# Patient Record
Sex: Female | Born: 1940
Health system: Southern US, Community
[De-identification: ages and names within clinical notes are randomized; demographics above are authoritative.]

## PROBLEM LIST (undated history)

## (undated) DIAGNOSIS — I252 Old myocardial infarction: Secondary | ICD-10-CM

## (undated) DIAGNOSIS — D4819 Other specified neoplasm of uncertain behavior of connective and other soft tissue: Secondary | ICD-10-CM

## (undated) DIAGNOSIS — K5730 Diverticulosis of large intestine without perforation or abscess without bleeding: Secondary | ICD-10-CM

## (undated) DIAGNOSIS — Z5189 Encounter for other specified aftercare: Secondary | ICD-10-CM

## (undated) DIAGNOSIS — D649 Anemia, unspecified: Secondary | ICD-10-CM

## (undated) DIAGNOSIS — M549 Dorsalgia, unspecified: Secondary | ICD-10-CM

## (undated) DIAGNOSIS — D481 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nd other soft tissue: Secondary | ICD-10-CM

## (undated) DIAGNOSIS — R350 Frequency of micturition: Secondary | ICD-10-CM

## (undated) DIAGNOSIS — I739 Peripheral vascular disease, unspecified: Secondary | ICD-10-CM

## (undated) DIAGNOSIS — I779 Disorder of arteries and arterioles, unspecified: Secondary | ICD-10-CM

## (undated) DIAGNOSIS — K922 Gastrointestinal hemorrhage, unspecified: Secondary | ICD-10-CM

## (undated) DIAGNOSIS — I5181 Takotsubo syndrome: Secondary | ICD-10-CM

## (undated) DIAGNOSIS — G8929 Other chronic pain: Secondary | ICD-10-CM

## (undated) DIAGNOSIS — Z8701 Personal history of pneumonia (recurrent): Secondary | ICD-10-CM

## (undated) DIAGNOSIS — I2510 Atherosclerotic heart disease of native coronary artery without angina pectoris: Secondary | ICD-10-CM

## (undated) DIAGNOSIS — Z9289 Personal history of other medical treatment: Secondary | ICD-10-CM

## (undated) DIAGNOSIS — Z860100 Personal history of colon polyps, unspecified: Secondary | ICD-10-CM

## (undated) DIAGNOSIS — E039 Hypothyroidism, unspecified: Secondary | ICD-10-CM

## (undated) DIAGNOSIS — E785 Hyperlipidemia, unspecified: Secondary | ICD-10-CM

## (undated) DIAGNOSIS — J40 Bronchitis, not specified as acute or chronic: Secondary | ICD-10-CM

## (undated) DIAGNOSIS — I10 Essential (primary) hypertension: Secondary | ICD-10-CM

## (undated) DIAGNOSIS — K5792 Diverticulitis of intestine, part unspecified, without perforation or abscess without bleeding: Secondary | ICD-10-CM

## (undated) DIAGNOSIS — M1990 Unspecified osteoarthritis, unspecified site: Secondary | ICD-10-CM

## (undated) DIAGNOSIS — I7300 Raynaud's syndrome without gangrene: Secondary | ICD-10-CM

## (undated) DIAGNOSIS — IMO0002 Reserved for concepts with insufficient information to code with codable children: Secondary | ICD-10-CM

## (undated) HISTORY — DX: Old myocardial infarction: I25.2

## (undated) HISTORY — PX: OTHER SURGICAL HISTORY: SHX169

## (undated) HISTORY — DX: Peripheral vascular disease, unspecified: I73.9

## (undated) HISTORY — DX: Disorder of arteries and arterioles, unspecified: I77.9

## (undated) HISTORY — DX: Hypothyroidism, unspecified: E03.9

## (undated) HISTORY — DX: Encounter for other specified aftercare: Z51.89

## (undated) HISTORY — DX: Personal history of colon polyps, unspecified: Z86.0100

## (undated) HISTORY — DX: Personal history of colonic polyps: Z86.010

## (undated) HISTORY — DX: Diverticulosis of large intestine without perforation or abscess without bleeding: K57.30

## (undated) HISTORY — PX: CHOLECYSTECTOMY: SHX55

## (undated) HISTORY — DX: Other specified neoplasm of uncertain behavior of connective and other soft tissue: D48.19

## (undated) HISTORY — PX: CORONARY ANGIOPLASTY WITH STENT PLACEMENT: SHX49

## (undated) HISTORY — PX: TUBAL LIGATION: SHX77

## (undated) HISTORY — DX: Gastrointestinal hemorrhage, unspecified: K92.2

## (undated) HISTORY — DX: 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nd other soft tissue: D48.1

## (undated) HISTORY — DX: Takotsubo syndrome: I51.81

## (undated) HISTORY — DX: Reserved for concepts with insufficient information to code with codable children: IMO0002

## (undated) HISTORY — DX: Frequency of micturition: R35.0

## (undated) HISTORY — DX: Raynaud's syndrome without gangrene: I73.00

## (undated) HISTORY — DX: Unspecified osteoarthritis, unspecified site: M19.90

## (undated) HISTORY — PX: ABDOMINAL HYSTERECTOMY: SUR658

## (undated) HISTORY — DX: Personal history of other medical treatment: Z92.89

---
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
## 1967-12-04 HISTORY — PX: BREAST EXCISIONAL BIOPSY: SUR124

## 1973-12-03 HISTORY — PX: ABDOMINAL HYSTERECTOMY: SHX81

## 1996-02-15 DIAGNOSIS — K5730 Diverticulosis of large intestine without perforation or abscess without bleeding: Secondary | ICD-10-CM | POA: Insufficient documentation

## 1999-01-24 ENCOUNTER — Encounter: Payer: Self-pay | Admitting: Emergency Medicine

## 1999-01-24 ENCOUNTER — Emergency Department (HOSPITAL_COMMUNITY): Admission: EM | Admit: 1999-01-24 | Discharge: 1999-01-24 | Payer: Self-pay | Admitting: Emergency Medicine

## 1999-06-09 ENCOUNTER — Encounter: Payer: Self-pay | Admitting: Internal Medicine

## 1999-07-17 ENCOUNTER — Encounter: Payer: Self-pay | Admitting: Vascular Surgery

## 1999-07-19 ENCOUNTER — Encounter (INDEPENDENT_AMBULATORY_CARE_PROVIDER_SITE_OTHER): Payer: Self-pay | Admitting: Specialist

## 1999-07-19 ENCOUNTER — Ambulatory Visit (HOSPITAL_COMMUNITY): Admission: RE | Admit: 1999-07-19 | Discharge: 1999-07-19 | Payer: Self-pay | Admitting: Vascular Surgery

## 2001-04-23 ENCOUNTER — Encounter: Payer: Self-pay | Admitting: Internal Medicine

## 2003-05-12 ENCOUNTER — Encounter: Payer: Self-pay | Admitting: Internal Medicine

## 2006-06-07 ENCOUNTER — Encounter: Payer: Self-pay | Admitting: Internal Medicine

## 2006-12-03 DIAGNOSIS — I252 Old myocardial infarction: Secondary | ICD-10-CM

## 2006-12-03 HISTORY — DX: Old myocardial infarction: I25.2

## 2007-01-07 ENCOUNTER — Ambulatory Visit: Payer: Self-pay | Admitting: Internal Medicine

## 2007-01-07 LAB — CONVERTED CEMR LAB
Basophils Absolute: 0 10*3/uL (ref 0.0–0.1)
Chloride: 106 meq/L (ref 96–112)
Creatinine, Ser: 1 mg/dL (ref 0.4–1.2)
Direct LDL: 214.9 mg/dL
Eosinophils Relative: 0.9 % (ref 0.0–5.0)
Glucose, Bld: 95 mg/dL (ref 70–99)
HCT: 41.9 % (ref 36.0–46.0)
Neutrophils Relative %: 54.2 % (ref 43.0–77.0)
RBC: 4.31 M/uL (ref 3.87–5.11)
RDW: 13.6 % (ref 11.5–14.6)
Sodium: 144 meq/L (ref 135–145)
Total CHOL/HDL Ratio: 7
Triglycerides: 217 mg/dL (ref 0–149)
WBC: 7.5 10*3/uL (ref 4.5–10.5)

## 2007-03-07 ENCOUNTER — Encounter: Payer: Self-pay | Admitting: Emergency Medicine

## 2007-03-07 ENCOUNTER — Ambulatory Visit: Payer: Self-pay | Admitting: *Deleted

## 2007-03-08 ENCOUNTER — Ambulatory Visit: Payer: Self-pay | Admitting: Internal Medicine

## 2007-03-08 ENCOUNTER — Inpatient Hospital Stay (HOSPITAL_COMMUNITY): Admission: EM | Admit: 2007-03-08 | Discharge: 2007-03-14 | Payer: Self-pay | Admitting: *Deleted

## 2007-03-08 ENCOUNTER — Encounter: Payer: Self-pay | Admitting: Internal Medicine

## 2007-03-11 ENCOUNTER — Encounter: Payer: Self-pay | Admitting: Gastroenterology

## 2007-03-11 ENCOUNTER — Encounter (INDEPENDENT_AMBULATORY_CARE_PROVIDER_SITE_OTHER): Payer: Self-pay | Admitting: Specialist

## 2007-03-11 DIAGNOSIS — K922 Gastrointestinal hemorrhage, unspecified: Secondary | ICD-10-CM | POA: Insufficient documentation

## 2007-03-13 ENCOUNTER — Ambulatory Visit: Payer: Self-pay | Admitting: Gastroenterology

## 2007-04-07 ENCOUNTER — Ambulatory Visit: Payer: Self-pay | Admitting: Gastroenterology

## 2007-04-07 LAB — CONVERTED CEMR LAB
AST: 22 units/L (ref 0–37)
Albumin: 3.9 g/dL (ref 3.5–5.2)
Basophils Absolute: 0 10*3/uL (ref 0.0–0.1)
Bilirubin, Direct: 0.1 mg/dL (ref 0.0–0.3)
CO2: 32 meq/L (ref 19–32)
Creatinine, Ser: 0.9 mg/dL (ref 0.4–1.2)
Eosinophils Relative: 1.2 % (ref 0.0–5.0)
Glucose, Bld: 102 mg/dL — ABNORMAL HIGH (ref 70–99)
HCT: 38.2 % (ref 36.0–46.0)
Hemoglobin: 13.1 g/dL (ref 12.0–15.0)
MCHC: 34.4 g/dL (ref 30.0–36.0)
Monocytes Absolute: 0.6 10*3/uL (ref 0.2–0.7)
Neutrophils Relative %: 57 % (ref 43.0–77.0)
Potassium: 3.5 meq/L (ref 3.5–5.1)
RDW: 13.4 % (ref 11.5–14.6)
Sodium: 139 meq/L (ref 135–145)
Total Bilirubin: 0.7 mg/dL (ref 0.3–1.2)
Total Protein: 7.1 g/dL (ref 6.0–8.3)
WBC: 6.4 10*3/uL (ref 4.5–10.5)

## 2007-04-10 ENCOUNTER — Ambulatory Visit: Payer: Self-pay | Admitting: Cardiovascular Disease

## 2007-04-17 ENCOUNTER — Encounter: Payer: Self-pay | Admitting: Gastroenterology

## 2007-04-17 ENCOUNTER — Encounter: Payer: Self-pay | Admitting: Internal Medicine

## 2007-04-17 ENCOUNTER — Ambulatory Visit (HOSPITAL_COMMUNITY): Admission: RE | Admit: 2007-04-17 | Discharge: 2007-04-17 | Payer: Self-pay | Admitting: Gastroenterology

## 2007-04-17 ENCOUNTER — Encounter (INDEPENDENT_AMBULATORY_CARE_PROVIDER_SITE_OTHER): Payer: Self-pay | Admitting: *Deleted

## 2007-04-30 ENCOUNTER — Ambulatory Visit: Payer: Self-pay | Admitting: Gastroenterology

## 2007-07-31 ENCOUNTER — Ambulatory Visit: Payer: Self-pay | Admitting: Cardiovascular Disease

## 2007-09-08 ENCOUNTER — Telehealth: Payer: Self-pay | Admitting: Internal Medicine

## 2007-09-09 ENCOUNTER — Ambulatory Visit: Payer: Self-pay | Admitting: Gastroenterology

## 2007-09-09 LAB — CONVERTED CEMR LAB
ALT: 9 units/L (ref 0–35)
AST: 14 units/L (ref 0–37)
Albumin: 4 g/dL (ref 3.5–5.2)
Amylase: 101 units/L (ref 27–131)
Basophils Relative: 0.2 % (ref 0.0–1.0)
Bilirubin, Direct: 0.1 mg/dL (ref 0.0–0.3)
Calcium: 9.6 mg/dL (ref 8.4–10.5)
Chloride: 97 meq/L (ref 96–112)
Creatinine, Ser: 1 mg/dL (ref 0.4–1.2)
Eosinophils Relative: 0.7 % (ref 0.0–5.0)
GFR calc non Af Amer: 59 mL/min
Glucose, Bld: 97 mg/dL (ref 70–99)
HCT: 38.3 % (ref 36.0–46.0)
Neutrophils Relative %: 51 % (ref 43.0–77.0)
Platelets: 322 10*3/uL (ref 150–400)
RBC: 4.01 M/uL (ref 3.87–5.11)
RDW: 13.4 % (ref 11.5–14.6)
Sodium: 138 meq/L (ref 135–145)
Total Bilirubin: 0.7 mg/dL (ref 0.3–1.2)
WBC: 7.6 10*3/uL (ref 4.5–10.5)

## 2007-09-11 ENCOUNTER — Ambulatory Visit: Payer: Self-pay | Admitting: Cardiology

## 2007-09-30 ENCOUNTER — Ambulatory Visit: Payer: Self-pay | Admitting: Gastroenterology

## 2007-10-14 ENCOUNTER — Encounter: Payer: Self-pay | Admitting: Internal Medicine

## 2007-10-15 ENCOUNTER — Ambulatory Visit: Payer: Self-pay | Admitting: Cardiology

## 2007-10-16 ENCOUNTER — Ambulatory Visit: Payer: Self-pay

## 2007-10-16 ENCOUNTER — Encounter: Payer: Self-pay | Admitting: Internal Medicine

## 2007-10-20 DIAGNOSIS — D481 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nd other soft tissue: Secondary | ICD-10-CM

## 2007-10-29 ENCOUNTER — Encounter: Payer: Self-pay | Admitting: Internal Medicine

## 2007-11-03 ENCOUNTER — Ambulatory Visit: Payer: Self-pay | Admitting: Internal Medicine

## 2007-11-10 ENCOUNTER — Ambulatory Visit: Payer: Self-pay | Admitting: Internal Medicine

## 2007-11-10 DIAGNOSIS — R350 Frequency of micturition: Secondary | ICD-10-CM

## 2007-11-10 DIAGNOSIS — N390 Urinary tract infection, site not specified: Secondary | ICD-10-CM

## 2007-11-10 LAB — CONVERTED CEMR LAB
Bilirubin Urine: NEGATIVE
Glucose, Urine, Semiquant: NEGATIVE
Protein, U semiquant: 100
Urobilinogen, UA: NEGATIVE
pH: 5

## 2007-12-15 ENCOUNTER — Telehealth: Payer: Self-pay | Admitting: Internal Medicine

## 2007-12-16 ENCOUNTER — Encounter: Payer: Self-pay | Admitting: Internal Medicine

## 2007-12-18 ENCOUNTER — Encounter: Admission: RE | Admit: 2007-12-18 | Discharge: 2007-12-18 | Payer: Self-pay | Admitting: Internal Medicine

## 2008-01-19 ENCOUNTER — Telehealth (INDEPENDENT_AMBULATORY_CARE_PROVIDER_SITE_OTHER): Payer: Self-pay | Admitting: *Deleted

## 2008-02-05 DIAGNOSIS — E039 Hypothyroidism, unspecified: Secondary | ICD-10-CM

## 2008-02-05 DIAGNOSIS — I10 Essential (primary) hypertension: Secondary | ICD-10-CM | POA: Insufficient documentation

## 2008-03-25 ENCOUNTER — Ambulatory Visit: Payer: Self-pay | Admitting: Internal Medicine

## 2008-03-25 DIAGNOSIS — M899 Disorder of bone, unspecified: Secondary | ICD-10-CM | POA: Insufficient documentation

## 2008-03-25 DIAGNOSIS — M949 Disorder of cartilage, unspecified: Secondary | ICD-10-CM

## 2008-03-25 DIAGNOSIS — I7300 Raynaud's syndrome without gangrene: Secondary | ICD-10-CM

## 2008-03-25 DIAGNOSIS — E785 Hyperlipidemia, unspecified: Secondary | ICD-10-CM

## 2008-03-25 DIAGNOSIS — Z8719 Personal history of other diseases of the digestive system: Secondary | ICD-10-CM | POA: Insufficient documentation

## 2008-03-25 DIAGNOSIS — I2510 Atherosclerotic heart disease of native coronary artery without angina pectoris: Secondary | ICD-10-CM

## 2008-03-25 DIAGNOSIS — F411 Generalized anxiety disorder: Secondary | ICD-10-CM | POA: Insufficient documentation

## 2008-03-29 LAB — CONVERTED CEMR LAB: TSH: 0.03 microintl units/mL — ABNORMAL LOW (ref 0.35–5.50)

## 2008-03-30 ENCOUNTER — Telehealth: Payer: Self-pay | Admitting: Internal Medicine

## 2008-04-29 ENCOUNTER — Ambulatory Visit: Payer: Self-pay | Admitting: Internal Medicine

## 2008-07-05 ENCOUNTER — Telehealth: Payer: Self-pay | Admitting: Internal Medicine

## 2008-08-02 ENCOUNTER — Telehealth: Payer: Self-pay | Admitting: Internal Medicine

## 2008-10-04 ENCOUNTER — Ambulatory Visit: Payer: Self-pay | Admitting: Internal Medicine

## 2008-10-04 DIAGNOSIS — M549 Dorsalgia, unspecified: Secondary | ICD-10-CM

## 2008-11-03 ENCOUNTER — Telehealth: Payer: Self-pay | Admitting: Gastroenterology

## 2008-11-10 ENCOUNTER — Telehealth (INDEPENDENT_AMBULATORY_CARE_PROVIDER_SITE_OTHER): Payer: Self-pay | Admitting: *Deleted

## 2008-11-16 ENCOUNTER — Ambulatory Visit: Payer: Self-pay | Admitting: Gastroenterology

## 2008-11-19 LAB — CONVERTED CEMR LAB
ALT: 10 units/L (ref 0–35)
BUN: 17 mg/dL (ref 6–23)
Basophils Relative: 0.5 % (ref 0.0–3.0)
CO2: 30 meq/L (ref 19–32)
Calcium: 9.6 mg/dL (ref 8.4–10.5)
Chloride: 101 meq/L (ref 96–112)
Creatinine, Ser: 1.1 mg/dL (ref 0.4–1.2)
Eosinophils Relative: 1.3 % (ref 0.0–5.0)
GFR calc Af Amer: 64 mL/min
GFR calc non Af Amer: 53 mL/min
Glucose, Bld: 101 mg/dL — ABNORMAL HIGH (ref 70–99)
Hemoglobin: 13.9 g/dL (ref 12.0–15.0)
Lymphocytes Relative: 41.2 % (ref 12.0–46.0)
MCHC: 34.8 g/dL (ref 30.0–36.0)
Monocytes Relative: 7.7 % (ref 3.0–12.0)
Neutro Abs: 3.7 10*3/uL (ref 1.4–7.7)
Neutrophils Relative %: 49.3 % (ref 43.0–77.0)
RBC: 4.07 M/uL (ref 3.87–5.11)
Total Bilirubin: 0.6 mg/dL (ref 0.3–1.2)
WBC: 7.5 10*3/uL (ref 4.5–10.5)

## 2008-11-22 ENCOUNTER — Encounter: Payer: Self-pay | Admitting: Gastroenterology

## 2008-12-03 LAB — HM MAMMOGRAPHY

## 2008-12-14 ENCOUNTER — Ambulatory Visit: Payer: Self-pay | Admitting: Gastroenterology

## 2008-12-14 ENCOUNTER — Encounter: Payer: Self-pay | Admitting: Gastroenterology

## 2008-12-17 ENCOUNTER — Encounter: Payer: Self-pay | Admitting: Gastroenterology

## 2008-12-20 ENCOUNTER — Encounter: Admission: RE | Admit: 2008-12-20 | Discharge: 2008-12-20 | Payer: Self-pay | Admitting: Internal Medicine

## 2008-12-23 ENCOUNTER — Telehealth: Payer: Self-pay | Admitting: Gastroenterology

## 2009-02-22 ENCOUNTER — Telehealth: Payer: Self-pay | Admitting: Gastroenterology

## 2009-03-29 ENCOUNTER — Ambulatory Visit: Payer: Self-pay | Admitting: Gastroenterology

## 2009-03-29 ENCOUNTER — Ambulatory Visit: Payer: Self-pay | Admitting: Cardiovascular Disease

## 2009-03-29 DIAGNOSIS — R002 Palpitations: Secondary | ICD-10-CM

## 2009-03-29 DIAGNOSIS — R1031 Right lower quadrant pain: Secondary | ICD-10-CM | POA: Insufficient documentation

## 2009-03-29 LAB — CONVERTED CEMR LAB
BUN: 10 mg/dL (ref 6–23)
Creatinine, Ser: 0.9 mg/dL (ref 0.4–1.2)

## 2009-03-30 ENCOUNTER — Ambulatory Visit: Payer: Self-pay | Admitting: Cardiology

## 2009-04-04 ENCOUNTER — Telehealth (INDEPENDENT_AMBULATORY_CARE_PROVIDER_SITE_OTHER): Payer: Self-pay

## 2009-04-05 ENCOUNTER — Ambulatory Visit: Payer: Self-pay

## 2009-04-05 ENCOUNTER — Ambulatory Visit: Payer: Self-pay | Admitting: Cardiovascular Disease

## 2009-04-21 ENCOUNTER — Ambulatory Visit: Payer: Self-pay | Admitting: Internal Medicine

## 2009-04-27 ENCOUNTER — Encounter: Payer: Self-pay | Admitting: Internal Medicine

## 2009-04-27 ENCOUNTER — Ambulatory Visit: Payer: Self-pay

## 2009-05-11 ENCOUNTER — Emergency Department (HOSPITAL_BASED_OUTPATIENT_CLINIC_OR_DEPARTMENT_OTHER): Admission: EM | Admit: 2009-05-11 | Discharge: 2009-05-11 | Payer: Self-pay | Admitting: Emergency Medicine

## 2009-05-11 ENCOUNTER — Encounter: Payer: Self-pay | Admitting: Internal Medicine

## 2009-06-01 ENCOUNTER — Encounter: Payer: Self-pay | Admitting: Internal Medicine

## 2009-06-07 ENCOUNTER — Ambulatory Visit: Payer: Self-pay | Admitting: Gastroenterology

## 2009-06-20 ENCOUNTER — Ambulatory Visit: Payer: Self-pay | Admitting: Gastroenterology

## 2009-06-20 ENCOUNTER — Encounter: Payer: Self-pay | Admitting: Gastroenterology

## 2009-06-23 ENCOUNTER — Encounter: Payer: Self-pay | Admitting: Gastroenterology

## 2009-07-22 ENCOUNTER — Ambulatory Visit: Payer: Self-pay | Admitting: Internal Medicine

## 2009-07-25 LAB — CONVERTED CEMR LAB
AST: 14 units/L (ref 0–37)
Alkaline Phosphatase: 99 units/L (ref 39–117)
Basophils Absolute: 0 10*3/uL (ref 0.0–0.1)
Basophils Relative: 0.1 % (ref 0.0–3.0)
Bilirubin, Direct: 0 mg/dL (ref 0.0–0.3)
CO2: 33 meq/L — ABNORMAL HIGH (ref 19–32)
Calcium: 9.3 mg/dL (ref 8.4–10.5)
Creatinine, Ser: 0.8 mg/dL (ref 0.4–1.2)
Eosinophils Absolute: 0.1 10*3/uL (ref 0.0–0.7)
GFR calc non Af Amer: 76.03 mL/min (ref >60)
Lymphocytes Relative: 46.6 % — ABNORMAL HIGH (ref 12.0–46.0)
MCHC: 33.6 g/dL (ref 30.0–36.0)
Monocytes Relative: 7.9 % (ref 3.0–12.0)
Neutrophils Relative %: 44.1 % (ref 43.0–77.0)
RBC: 4.24 M/uL (ref 3.87–5.11)
RDW: 14.2 % (ref 11.5–14.6)
Sodium: 143 meq/L (ref 135–145)

## 2009-08-02 ENCOUNTER — Encounter: Payer: Self-pay | Admitting: Internal Medicine

## 2009-08-12 ENCOUNTER — Telehealth (INDEPENDENT_AMBULATORY_CARE_PROVIDER_SITE_OTHER): Payer: Self-pay | Admitting: *Deleted

## 2009-08-15 ENCOUNTER — Encounter: Admission: RE | Admit: 2009-08-15 | Discharge: 2009-08-15 | Payer: Self-pay | Admitting: General Surgery

## 2009-09-16 ENCOUNTER — Telehealth: Payer: Self-pay | Admitting: Internal Medicine

## 2009-11-09 ENCOUNTER — Telehealth: Payer: Self-pay | Admitting: Internal Medicine

## 2010-01-13 ENCOUNTER — Ambulatory Visit: Payer: Self-pay | Admitting: Cardiovascular Disease

## 2010-01-30 ENCOUNTER — Ambulatory Visit: Payer: Self-pay | Admitting: Cardiovascular Disease

## 2010-02-01 LAB — CONVERTED CEMR LAB
BUN: 8 mg/dL (ref 6–23)
Chloride: 108 meq/L (ref 96–112)
Glucose, Bld: 118 mg/dL — ABNORMAL HIGH (ref 70–99)
Potassium: 3.7 meq/L (ref 3.5–5.1)

## 2010-02-21 ENCOUNTER — Ambulatory Visit: Payer: Self-pay | Admitting: Gastroenterology

## 2010-02-22 LAB — CONVERTED CEMR LAB
ALT: 11 units/L (ref 0–35)
Alkaline Phosphatase: 80 units/L (ref 39–117)
Basophils Absolute: 0.1 10*3/uL (ref 0.0–0.1)
Eosinophils Absolute: 0.1 10*3/uL (ref 0.0–0.7)
Glucose, Bld: 79 mg/dL (ref 70–99)
HCT: 39.8 % (ref 36.0–46.0)
Lymphs Abs: 2.6 10*3/uL (ref 0.7–4.0)
MCHC: 33.5 g/dL (ref 30.0–36.0)
MCV: 96.1 fL (ref 78.0–100.0)
Monocytes Absolute: 0.4 10*3/uL (ref 0.1–1.0)
Platelets: 223 10*3/uL (ref 150.0–400.0)
RDW: 13.4 % (ref 11.5–14.6)
Sodium: 140 meq/L (ref 135–145)
Total Bilirubin: 0.5 mg/dL (ref 0.3–1.2)
Total Protein: 6.4 g/dL (ref 6.0–8.3)

## 2010-02-24 ENCOUNTER — Ambulatory Visit: Payer: Self-pay | Admitting: Internal Medicine

## 2010-02-27 ENCOUNTER — Encounter (INDEPENDENT_AMBULATORY_CARE_PROVIDER_SITE_OTHER): Payer: Self-pay | Admitting: *Deleted

## 2010-02-28 ENCOUNTER — Ambulatory Visit: Payer: Self-pay | Admitting: Gastroenterology

## 2010-03-07 ENCOUNTER — Ambulatory Visit: Payer: Self-pay | Admitting: Gastroenterology

## 2010-06-29 ENCOUNTER — Ambulatory Visit: Payer: Self-pay | Admitting: Internal Medicine

## 2010-06-29 DIAGNOSIS — I671 Cerebral aneurysm, nonruptured: Secondary | ICD-10-CM

## 2010-06-29 LAB — CONVERTED CEMR LAB: Creatinine, Ser: 1.2 mg/dL (ref 0.4–1.2)

## 2010-06-30 ENCOUNTER — Ambulatory Visit: Payer: Self-pay | Admitting: Gastroenterology

## 2010-06-30 LAB — CONVERTED CEMR LAB: Sed Rate: 9 mm/hr (ref 0–22)

## 2010-07-03 ENCOUNTER — Encounter: Payer: Self-pay | Admitting: Internal Medicine

## 2010-07-03 ENCOUNTER — Ambulatory Visit: Payer: Self-pay

## 2010-07-03 DIAGNOSIS — I739 Peripheral vascular disease, unspecified: Secondary | ICD-10-CM

## 2010-07-11 ENCOUNTER — Telehealth: Payer: Self-pay | Admitting: Cardiovascular Disease

## 2010-07-12 ENCOUNTER — Telehealth: Payer: Self-pay | Admitting: Internal Medicine

## 2010-07-13 ENCOUNTER — Ambulatory Visit: Payer: Self-pay | Admitting: Cardiovascular Disease

## 2010-07-28 ENCOUNTER — Ambulatory Visit: Payer: Self-pay | Admitting: Internal Medicine

## 2010-07-28 DIAGNOSIS — Z86010 Personal history of colonic polyps: Secondary | ICD-10-CM

## 2010-07-28 LAB — CONVERTED CEMR LAB
BUN: 12 mg/dL (ref 6–23)
Basophils Absolute: 0 10*3/uL (ref 0.0–0.1)
Bilirubin, Direct: 0.1 mg/dL (ref 0.0–0.3)
Chloride: 106 meq/L (ref 96–112)
Cholesterol: 244 mg/dL — ABNORMAL HIGH (ref 0–200)
Creatinine, Ser: 1 mg/dL (ref 0.4–1.2)
Direct LDL: 163.2 mg/dL
Eosinophils Absolute: 0.1 10*3/uL (ref 0.0–0.7)
MCHC: 33.9 g/dL (ref 30.0–36.0)
MCV: 100.2 fL — ABNORMAL HIGH (ref 78.0–100.0)
Monocytes Absolute: 0.6 10*3/uL (ref 0.1–1.0)
Neutrophils Relative %: 54.1 % (ref 43.0–77.0)
Platelets: 221 10*3/uL (ref 150.0–400.0)
RDW: 15.4 % — ABNORMAL HIGH (ref 11.5–14.6)
Total Bilirubin: 0.4 mg/dL (ref 0.3–1.2)
VLDL: 40.8 mg/dL — ABNORMAL HIGH (ref 0.0–40.0)
WBC: 7.5 10*3/uL (ref 4.5–10.5)

## 2010-07-31 ENCOUNTER — Telehealth: Payer: Self-pay | Admitting: Internal Medicine

## 2010-08-08 ENCOUNTER — Telehealth: Payer: Self-pay | Admitting: Internal Medicine

## 2010-09-29 ENCOUNTER — Ambulatory Visit: Payer: Self-pay | Admitting: Cardiovascular Disease

## 2010-10-31 ENCOUNTER — Ambulatory Visit: Payer: Self-pay | Admitting: Internal Medicine

## 2010-10-31 DIAGNOSIS — H6120 Impacted cerumen, unspecified ear: Secondary | ICD-10-CM

## 2010-10-31 LAB — CONVERTED CEMR LAB: TSH: 2.42 microintl units/mL (ref 0.35–5.50)

## 2010-11-06 ENCOUNTER — Telehealth: Payer: Self-pay

## 2010-12-06 ENCOUNTER — Telehealth: Payer: Self-pay | Admitting: Internal Medicine

## 2011-01-04 NOTE — Progress Notes (Signed)
  Phone Note Call from Patient   Caller: Patient Call For: Gordy Savers  MD Reason for Call: Lab or Test Results Summary of Call: Needs lab results. Initial call taken by: Lynann Beaver CMA,  July 31, 2010 3:57 PM

## 2011-01-04 NOTE — Procedures (Signed)
Summary: Upper Endoscopy  Patient: Asusena Sigley Note: All result statuses are Final unless otherwise noted.  Tests: (1) Upper Endoscopy (EGD)   EGD Upper Endoscopy       DONE     [facility]     [address]     [place], [place]  [address]           ENDOSCOPY PROCEDURE REPORT     PATIENT:  [name], [name]  MR#:  [id number]     BIRTHDATE:  [birth year], 68 yrs. old  GENDER:  female     ENDOSCOPIST:  Rachael Fee, MD     PROCEDURE DATE:  03/07/2010     PROCEDURE:  EGD with biopsy     ASA CLASS:  Class II     INDICATIONS:  RUQ pain, somewhat positional.  Had duodenal GIST     resected in 2008 by Dr. Marilynn Rail (was 3cm, bleeding). Recent CBC,     CMET were normal. CT scan showed no clear GIST recurrence.     MEDICATIONS:  Fentanyl 75 mcg IV, Versed 7 mg IV     TOPICAL ANESTHETIC:  Exactacain Spray     DESCRIPTION OF PROCEDURE:   After the risks benefits and     alternatives of the procedure were thoroughly explained, informed     consent was obtained.  The [facility] [id number]-H180 [id number] endoscope was     introduced through the mouth and advanced to the third portion of     the duodenum, without limitations.  The instrument was slowly     withdrawn as the mucosa was fully examined.     <<PROCEDUREIMAGES>>     There was mild, non-specific distal gastritis. Biopsies were taken     to check for H. pylori (see image9).  Post-operative change was     noted. The 2nd portion of duodenum was mildly anatomically altered     from previous GIST resection, scar tissue present. No apparent     subepithelial lesions (see image5).  A hiatal hernia was found.     This was 1-2cm.  Otherwise the examination was normal (see image1,     image2, and image10).    Retroflexed views revealed no     abnormalities.    The scope was then withdrawn from the patient     and the procedure completed.     COMPLICATIONS:  None           ENDOSCOPIC IMPRESSION:     1) Mild gastritis, biopsied to check for H.  pylori     2) Post-operative change in duodenum for previous GIST resection           3) Small hiatal hernia     4) Otherwise normal examination           RECOMMENDATIONS:     If biopsies show H. pylori, she will be started on the     appropriate antibiotics.     The RUQ pain is positional, no related to eating. Given this     test, recent normal CT scan, CBC, cmet perhaps the pains are from     adhesive disease or musculoskeletal.     ______________________________     Rachael Fee, MD           n.     eSIGNED:   Rachael Fee at 03/07/2010 01:48 PM           Patience Musca, [phone]  Note: An exclamation mark Marland Kitchen)  indicates a result that was not dispersed into the flowsheet. Document Creation Date: 03/07/2010 1:49 PM _______________________________________________________________________  (1) Order result status: Final Collection or observation date-time: 03/07/2010 13:39 Requested date-time:  Receipt date-time:  Reported date-time:  Referring Physician:   Ordering Physician: Rob Bunting [phone]) Specimen Source:  Source: Launa Grill Order Number: [id number] Lab site:

## 2011-01-04 NOTE — Progress Notes (Signed)
Summary: BUN and CREAT  Phone Note Call from Patient   Caller: CT Dept. Call For: Gordy Savers  MD Summary of Call: Pt needs BUN and CREAT before tomorrow for Brain Angio. Initial call taken by: Lynann Beaver CMA,  July 12, 2010 3:19 PM  Follow-up for Phone Call        These labs are in the system already.  I will notify [facility] CT Dept. Follow-up by: Corky Mull,  July 12, 2010 3:26 PM

## 2011-01-04 NOTE — Progress Notes (Signed)
Summary: bloodwork results  Phone Note Call from Patient Call back at Home Phone [phone]   Caller: Patient Call For: Gordy Savers  MD Summary of Call: pt needs bloodwork results Initial call taken by: Heron Sabins,  November 06, 2010 1:29 PM  Follow-up for Phone Call        spoke with wife - ths wnl - KIK Follow-up by: Duard Brady LPN,  November 06, 2010 4:34 PM

## 2011-01-04 NOTE — Assessment & Plan Note (Signed)
Summary: ROV   Visit Type:  Follow-up Primary Provider:  Eleonore Chiquito, MD  CC:  Pt. having chest pain.  History of Present Illness: 70 year old woman with CAD and prior PCI 03/2007 presents for follow-up evaluation. She complains of occasional chest pain at rest, lasting only a few minutes, located in the left chest and feels like an ache. This has only happened a few times and has never been associated with exertion. She had a stress Myoview in 2010 and this was negative for ischemia.  Denies dyspnea, orthopnea, PND, or edema. No other complaints.  Current Medications (verified): 1)  Metoprolol Succinate 50 Mg  Tb24 (Metoprolol Succinate) .Marland Kitchen.. 1 Once Daily 2)  Synthroid 100 Mcg  Tabs (Levothyroxine Sodium) .Marland Kitchen.. 1 Once Daily 3)  Aspirin 81 Mg Tbec (Aspirin) .... Take One Tablet By Mouth Daily  Allergies (verified): No Known Drug Allergies  Past History:  Past medical history reviewed for relevance to current acute and chronic problems.  Past Medical History: Reviewed history from 03/28/2009 and no changes required. CORONARY ARTERY DISEASE (ICD-414.00) S/P PCI of the LAD in 2008, BMS HYPERLIPIDEMIA (ICD-272.4) HYPERTENSION (ICD-401.9) BACK PAIN (ICD-724.5) RAYNAUD'S SYNDROME (ICD-443.0) GASTROINTESTINAL HEMORRHAGE, HX OF (ICD-V12.79) DIVERTICULOSIS, COLON (ICD-562.10) OSTEOPENIA (ICD-733.90) ANXIETY (ICD-300.00) DIVERTICULOSIS OF COLON (ICD-562.10) Hx of UNSPECIFIED HEMORRHAGE OF GASTROINTESTINAL TRACT (ICD-578.9) HYPOTHYROIDISM (ICD-244.9) NEOPLASM UNCERTAIN BHV CNCTV&OTH SOFT TISSUE (ICD-238.1) UTI (ICD-599.0) URINARY FREQUENCY (ICD-788.41)  Review of Systems       Positive for abdominal pain, otherwise negative except as per HPI.  Vital Signs:  Patient profile:   70 year old female Height:      66 inches Weight:      142 pounds BMI:     23.00 Pulse rate:   61 / minute Pulse rhythm:   regular Resp:     18 per minute BP sitting:   154 / 90  (left  arm) Cuff size:   large  Vitals Entered By: Vikki Ports (January 13, 2010 9:00 AM)  Physical Exam  General:  Pt is alert and oriented, in no acute distress. HEENT: normal Neck: normal carotid upstrokes without bruits, JVP normal Lungs: CTA CV: RRR without murmur or gallop Abd: soft, NT, positive BS, no bruit, no organomegaly Ext: no clubbing, cyanosis, or edema. peripheral pulses 2+ and equal Skin: warm and dry without rash    EKG  Procedure date:  01/13/2010  Findings:      NSR, left axis deviation, nonspecific Twave abnormality, HR 61 bpm, unchanged from previous tracing.  Impression & Recommendations:  Problem # 1:  CORONARY ARTERY DISEASE (ICD-414.00) The patient is stable, Myoview stress within the last year was negative for ischemia. Her symptoms are atypical and I don't think they require further workup at this time...they are unlike the chest pain she had at the time of her initial coronary event.  I advised her to call if symptoms worsen or occur wiht exertion.  Continue ASA and metoprolol, she has been intolerant/unwilling to take lipid-lowering medications.  Her updated medication list for this problem includes:    Metoprolol Succinate 50 Mg Tb24 (Metoprolol succinate) .Marland Kitchen... 1 once daily    Aspirin 81 Mg Tbec (Aspirin) .Marland Kitchen... Take one tablet by mouth daily  Orders: EKG w/ Interpretation (93[address])  Problem # 2:  HYPERTENSION (ICD-401.9)  BP elevated. Recommended resume Benazepril/HCTZ 20/12.5 mg daily in addition to metoprolol.  Her updated medication list for this problem includes:    Metoprolol Succinate 50 Mg Tb24 (Metoprolol succinate) .Marland Kitchen... 1 once daily  Aspirin 81 Mg Tbec (Aspirin) .Marland Kitchen... Take one tablet by mouth daily    Benicar Hct 20-12.5 Mg Tabs (Olmesartan medoxomil-hctz) .Marland Kitchen... Take one tablet by mouth daily  Orders: EKG w/ Interpretation (93[address])  BP today: 154/90 Prior BP: 124/86 (07/22/2009)  Labs Reviewed: K+: 4.6 (07/22/2009) Creat: :  0.8 (07/22/2009)   Chol: 299 (01/07/2007)   HDL: 42.9 (01/07/2007)   LDL: DEL (01/07/2007)   TG: 217 (01/07/2007)  Problem # 3:  HYPERLIPIDEMIA (ICD-272.4) Have reviewed importance of statins in past, but she has not been willing to retry.  The following medications were removed from the medication list:    Simvastatin 20 Mg Tabs (Simvastatin) ..... One daily  Orders: EKG w/ Interpretation (93000)  CHOL: 299 (01/07/2007)   LDL: DEL (01/07/2007)   HDL: 42.9 (01/07/2007)   TG: 217 (01/07/2007)  Patient Instructions: 1)  Your physician recommends that you return for lab work in: 2 WEEKS (BMP 414.01, 401.1, 272.0) 2)  Your physician has recommended you make the following change in your medication: START Benicar HCT 20/12.5mg  one tablet daily 3)  Your physician wants you to follow-up in:  6 MONTHS.  You will receive a reminder letter in the mail two months in advance. If you don't receive a letter, please call our office to schedule the follow-up appointment. Prescriptions: BENICAR HCT 20-12.5 MG TABS (OLMESARTAN MEDOXOMIL-HCTZ) take one tablet by mouth daily  #30 x 8   Entered by:   Julieta Gutting, RN, BSN   Authorized by:   Norva Karvonen, MD   Signed by:   Julieta Gutting, RN, BSN on 01/13/2010   Method used:   Electronically to        [facility] (retail)       [address]       [place], [place]  [address]       Ph: [phone] or [phone]       Fax: [phone]   RxID:   [id number]

## 2011-01-04 NOTE — Assessment & Plan Note (Signed)
Summary: EAR DISCOMFORT/CONGESTION // RS - PT TO LAB FOR TSH // RS   Vital Signs:  Patient profile:   70 year old female Menstrual status:  postmenopausal Weight:      139 pounds Temp:     98.2 degrees F oral BP sitting:   122 / 84  (right arm) Cuff size:   regular  Vitals Entered By: Duard Brady LPN (October 31, 2010 3:09 PM) CC: C/O EARS PLUGGED Is Patient Diabetic? Yes   Primary Care Gilbert Narain:  Eleonore Chiquito, MD  CC:  C/O EARS PLUGGED.  History of Present Illness: 70 year old patient who has a history of hypertension, who is seen here today complaining of diminished auditory acuity.  Examination confirmed bilateral cerumen impactions, and after considerable futile attempts at irrigation will refer to ENT.  Otherwise, she is doing quite well.  Her blood pressure has been nicely controlled on medication that includes metoprolol and lisinopril.  She also has treated hypothyroidism  Allergies (verified): No Known Drug Allergies  Past History:  Past Medical History: Reviewed history from 07/28/2010 and no changes required. CORONARY ARTERY DISEASE (ICD-414.00) S/P PCI of the LAD in 2008, BMS HYPERLIPIDEMIA (ICD-272.4) HYPERTENSION (ICD-401.9) BACK PAIN (ICD-724.5) RAYNAUD'S SYNDROME (ICD-443.0) GASTROINTESTINAL HEMORRHAGE, HX OF (ICD-V12.79) DIVERTICULOSIS, COLON (ICD-562.10) OSTEOPENIA (ICD-733.90) ANXIETY (ICD-300.00) DIVERTICULOSIS OF COLON (ICD-562.10) Hx of UNSPECIFIED HEMORRHAGE OF GASTROINTESTINAL TRACT (ICD-578.9) HYPOTHYROIDISM (ICD-244.9) NEOPLASM UNCERTAIN BHV CNCTV&OTH SOFT TISSUE (ICD-238.1) UTI (ICD-599.0) URINARY FREQUENCY (ICD-788.41) Colonic polyps, hx of Hypothyroidism  Review of Systems       The patient complains of decreased hearing.  The patient denies anorexia, fever, weight loss, weight gain, vision loss, hoarseness, chest pain, syncope, dyspnea on exertion, peripheral edema, prolonged cough, headaches, hemoptysis, abdominal pain,  melena, hematochezia, severe indigestion/heartburn, hematuria, incontinence, genital sores, muscle weakness, suspicious skin lesions, transient blindness, difficulty walking, depression, unusual weight change, abnormal bleeding, enlarged lymph nodes, angioedema, and breast masses.    Physical Exam  General:  Well-developed,well-nourished,in no acute distress; alert,appropriate and cooperative throughout examination; normal blood pressure Head:  Normocephalic and atraumatic without obvious abnormalities. No apparent alopecia or balding. Ears:  bilateral cerumen impactions   Impression & Recommendations:  Problem # 1:  CERUMEN IMPACTION (ICD-380.4)  Orders: ENT Referral (ENT)  Problem # 2:  HYPERTENSION (ICD-401.9)  Her updated medication list for this problem includes:    Metoprolol Succinate 50 Mg Tb24 (Metoprolol succinate) .Marland Kitchen... 1 once daily    Lisinopril 10 Mg Tabs (Lisinopril) .Marland Kitchen... Take one tablet by mouth daily  Complete Medication List: 1)  Metoprolol Succinate 50 Mg Tb24 (Metoprolol succinate) .Marland Kitchen.. 1 once daily 2)  Aspirin 81 Mg Tbec (Aspirin) .... Take one tablet by mouth daily 3)  Advil 200 Mg Tabs (Ibuprofen) .... As needed 4)  Lisinopril 10 Mg Tabs (Lisinopril) .... Take one tablet by mouth daily 5)  Synthroid 125 Mcg Tabs (Levothyroxine sodium) .... One daily  Other Orders: Venipuncture (81191) TLB-TSH (Thyroid Stimulating Hormone) [phone])  Patient Instructions: 1)  ENT referral as scheduled 2)  Limit your Sodium (Salt) to less than 2 grams a day(slightly less than 1/2 a teaspoon) to prevent fluid retention, swelling, or worsening of symptoms. 3)  Please schedule a follow-up appointment in 6 months. Prescriptions: SYNTHROID 125 MCG TABS (LEVOTHYROXINE SODIUM) one daily  #90 x 6   Entered and Authorized by:   Gordy Savers  MD   Signed by:   Gordy Savers  MD on 10/31/2010   Method used:   Electronically to  [facility] [address]  (retail)       [address]       [place], [place]  [address]       Ph: [phone] or [phone]       Fax: [phone]   RxID:   [id number] LEVOTHYROXINE SODIUM 125 MCG TABS (LEVOTHYROXINE SODIUM) Take 1 tablet by mouth once a day  #90 x 5   Entered and Authorized by:   Gordy Savers  MD   Signed by:   Gordy Savers  MD on 10/31/2010   Method used:   Electronically to        [facility] [address] (retail)       [address]       [place], [place]  [address]       Ph: [phone] or [phone]       Fax: [phone]   RxID:   [id number] LISINOPRIL 10 MG TABS (LISINOPRIL) take one tablet by mouth daily  #90 x 8   Entered and Authorized by:   Gordy Savers  MD   Signed by:   Gordy Savers  MD on 10/31/2010   Method used:   Electronically to        [facility] (retail)       [address]       [place], [place]  [address]       Ph: [phone] or [phone]       Fax: [phone]   RxID:   [id number] METOPROLOL SUCCINATE 50 MG  TB24 (METOPROLOL SUCCINATE) 1 once daily  #90 x 6   Entered and Authorized by:   Gordy Savers  MD   Signed by:   Gordy Savers  MD on 10/31/2010   Method used:   Electronically to        [facility] (retail)       [address]       [place], [place]  [address]       Ph: [phone] or [phone]       Fax: [phone]   RxID:   [id number]    Orders Added: 1)  Est. Patient Level III [09983] 2)  ENT Referral [ENT] 3)  Venipuncture [38250] 4)  TLB-TSH (Thyroid Stimulating Hormone) [53976-BHA]

## 2011-01-04 NOTE — Miscellaneous (Signed)
Summary: Orders Update  Clinical Lists Changes  Problems: Added new problem of CAROTID ARTERY DISEASE (ICD-433.10) Orders: Added new Test order of Carotid Duplex (Carotid Duplex) - Signed 

## 2011-01-04 NOTE — Assessment & Plan Note (Signed)
Review of gastrointestinal problems: 1. Periampullary mass seen on endoscopy in April of 2008, 2-3 cm, somewhat ulcerated.  Biopsy showed focal ulceration, but no granulomas or malignancy was identified.  CT scan of abdomen and pelvis with IV and oral contrast also documented a 3.2 cm periampullary mass, enhancing.  The patient was on Plavix at the time of EGD.  Mucosal biopsy of the mass caused more than usual bleeding which was self-limited, although she did have some melena for 1-2 days.  Follow-up endoscopic ultrasound on Apr 17, 2007, showed a 2.5 periampullary duodenal mass located 2 cm distal to the major papilla, quite vascular.  It was oozing following multiple biopsies.  Biopsies showed spindle cell proliferation (GIST versus reactive changes).  Resection by Dr. Marilynn Rail 2008,with choledocotomy, CBD cannulation, pathologic diagnosis confirmed GIST 2. Question sigmoid diverticulitis seen on CT scan.  She completed a Cipro/Flagyl course. Was recommended to have a colonoscopy in the past but deferred. 3. Adenomatous colon polyps, colonoscopy January 2010(4 polyps, some piecemeal). recall colonoscopy at 6 month interval.  Repeat colonoscopy July 2011 found no recurrent adenomatous polyps. Next colonoscopy July 2013.   History of Present Illness Visit Type: Follow-up Visit Primary GI MD: Rob Bunting MD Primary Provider: Eleonore Chiquito, MD Requesting Provider: n/a Chief Complaint: RUQ abd pain  History of Present Illness:     very pleasant 70 year old woman whom I last saw the time of polyp surveillance colonoscopy about 7-8 months ago.  She has been having discomforts in RUQ. Turning certain directions can cause the pain.  She has had the trouble for about 6-8 months.   the pain is throbbing, sometimes stabbing.  she also has weakness at times, feels hot at times. she also has periodic nausea.           Current Medications (verified): 1)  Metoprolol Succinate 50 Mg  Tb24  (Metoprolol Succinate) .Marland Kitchen.. 1 Once Daily 2)  Synthroid 100 Mcg  Tabs (Levothyroxine Sodium) .Marland Kitchen.. 1 Once Daily 3)  Aspirin 81 Mg Tbec (Aspirin) .... Take One Tablet By Mouth Daily 4)  Lisinopril-Hydrochlorothiazide 10-12.5 Mg Tabs (Lisinopril-Hydrochlorothiazide) .... Take One Tablet By Mouth Daily 5)  Advil 200 Mg Tabs (Ibuprofen) .... As Needed  Allergies (verified): No Known Drug Allergies  Vital Signs:  Patient profile:   70 year old female Height:      66 inches Weight:      141 pounds BMI:     22.84 BSA:     1.73 Pulse rate:   72 / minute Pulse rhythm:   regular BP sitting:   128 / 74  (left arm) Cuff size:   regular  Vitals Entered By: Ok Anis CMA (February 21, 2010 3:06 PM)  Physical Exam  Additional Exam:  Constitutional: generally well appearing Psychiatric: alert and oriented times 3 Abdomen: soft, mildly tender in the right upper quadrant, non-distended, normal bowel sounds: well healed midline abdominal scar from previous duodenal surgery    Impression & Recommendations:  Problem # 1:  right upper quadrant abdominal pain she had a GIST tumor removed from her. Ampullary duodenum in 2008. This was done at [facility]. These tumors can recur. She has not had any imaging or surveillance following the resection I think first we should do a CT scan with IV and oral contrast to see if there are any signs of new, recurrent GIST.  She has had her gallbladder removed already. Perhaps this is ulcer disease. I think it is scan does not show anything obvious then  I would proceed with upper endoscopy. She also get basic set of labs including a CBC, complete metabolic profile.  Other Orders: TLB-CBC Platelet - w/Differential (85025-CBCD) TLB-CMP (Comprehensive Metabolic Pnl) (80053-COMP)  Patient Instructions: 1)  You will get lab test(s) done today (cbc, cmet). 2)  You will be scheduled for a CT scan of abdomen and pelvis with IV and oral contrast (check for recurrent  duodenal GIST). 3)  A copy of this information will be sent to Dr. Amador Cunas. 4)  The medication list was reviewed and reconciled.  All changed / newly prescribed medications were explained.  A complete medication list was provided to the patient / caregiver.  Appended Document: Orders Update/CT    Clinical Lists Changes  Orders: Added new Referral order of CT Abdomen/Pelvis with Contrast (CT Abd/Pelvis w/con) - Signed

## 2011-01-04 NOTE — Assessment & Plan Note (Signed)
Summary: f78m   Visit Type:  6 months follow up Referring Provider:  n/a Primary Provider:  Eleonore Chiquito, MD  CC:  no cardiac complaints.  History of Present Illness: 70 year old woman with CAD and prior PCI 03/2007 presents for follow-up evaluation. She complains of occasional chest pain at rest, lasting only a few minutes, located in the left chest and feels like an ache. This has only happened a few times and has never been associated with exertion. She had a stress Myoview in 2010 and this was negative for ischemia.  Denies dyspnea, orthopnea, PND, or edema. No other complaints. Continues to smoke.  Current Medications (verified): 1)  Metoprolol Succinate 50 Mg  Tb24 (Metoprolol Succinate) .Marland Kitchen.. 1 Once Daily 2)  Aspirin 81 Mg Tbec (Aspirin) .... Take One Tablet By Mouth Daily 3)  Advil 200 Mg Tabs (Ibuprofen) .... As Needed 4)  Lisinopril 10 Mg Tabs (Lisinopril) .... Not On A Regular Basis 5)  Levothyroxine Sodium 125 Mcg Tabs (Levothyroxine Sodium) .... Take 1 Tablet By Mouth Once A Day  Allergies (verified): No Known Drug Allergies  Past History:  Past medical history reviewed for relevance to current acute and chronic problems.  Past Medical History: Reviewed history from 07/28/2010 and no changes required. CORONARY ARTERY DISEASE (ICD-414.00) S/P PCI of the LAD in 2008, BMS HYPERLIPIDEMIA (ICD-272.4) HYPERTENSION (ICD-401.9) BACK PAIN (ICD-724.5) RAYNAUD'S SYNDROME (ICD-443.0) GASTROINTESTINAL HEMORRHAGE, HX OF (ICD-V12.79) DIVERTICULOSIS, COLON (ICD-562.10) OSTEOPENIA (ICD-733.90) ANXIETY (ICD-300.00) DIVERTICULOSIS OF COLON (ICD-562.10) Hx of UNSPECIFIED HEMORRHAGE OF GASTROINTESTINAL TRACT (ICD-578.9) HYPOTHYROIDISM (ICD-244.9) NEOPLASM UNCERTAIN BHV CNCTV&OTH SOFT TISSUE (ICD-238.1) UTI (ICD-599.0) URINARY FREQUENCY (ICD-788.41) Colonic polyps, hx of Hypothyroidism  Review of Systems       Positive for neck pain, otherwise negative except as per  HPI  Vital Signs:  Patient profile:   70 year old female Menstrual status:  postmenopausal Height:      66 inches Weight:      137.75 pounds BMI:     22.31 Pulse rate:   60 / minute Pulse rhythm:   regular Resp:     18 per minute BP sitting:   153 / 82  (left arm)  Vitals Entered By: Vikki Ports (September 29, 2010 3:35 PM)  Physical Exam  General:  Pt is alert and oriented, in no acute distress. HEENT: normal Neck: normal carotid upstrokes without bruits, JVP normal Lungs: CTA CV: RRR without murmur or gallop Abd: soft, NT, positive BS, no bruit, no organomegaly Ext: no clubbing, cyanosis, or edema. peripheral pulses 2+ and equal Skin: warm and dry without rash    EKG  Procedure date:  09/29/2010  Findings:      NSR, LAD, right atrial enlargement, nonspecific Twave abnormality, HR 61 bpm.  Impression & Recommendations:  Problem # 1:  CORONARY ARTERY DISEASE (ICD-414.00) Stable without angina. BP is elevated and asked her to resume daily lisinopril...she is not regularly taking this even though it's on her med list. Discussed tobacco cessation. She has been unwilling to take a statin despite counseling on the benefits in CAD patients.  Her updated medication list for this problem includes:    Metoprolol Succinate 50 Mg Tb24 (Metoprolol succinate) .Marland Kitchen... 1 once daily    Aspirin 81 Mg Tbec (Aspirin) .Marland Kitchen... Take one tablet by mouth daily    Lisinopril 10 Mg Tabs (Lisinopril) .Marland Kitchen... Take one tablet by mouth daily  Problem # 2:  HYPERTENSION (ICD-401.9) Discussion as above.   Her updated medication list for this problem includes:  Metoprolol Succinate 50 Mg Tb24 (Metoprolol succinate) .Marland Kitchen... 1 once daily    Aspirin 81 Mg Tbec (Aspirin) .Marland Kitchen... Take one tablet by mouth daily    Lisinopril 10 Mg Tabs (Lisinopril) .Marland Kitchen... Take one tablet by mouth daily  BP today: 153/82 Prior BP: 140/90 (07/28/2010)  Labs Reviewed: K+: 4.2 (07/28/2010) Creat: : 1.0 (07/28/2010)   Chol:  244 (07/28/2010)   HDL: 39.90 (07/28/2010)   LDL: DEL (01/07/2007)   TG: 204.0 (07/28/2010)  Problem # 3:  HYPERLIPIDEMIA (ICD-272.4) Lipids well above goal - unwilling to take a statin drug.  CHOL: 244 (07/28/2010)   LDL: DEL (01/07/2007)   HDL: 39.90 (07/28/2010)   TG: 204.0 (07/28/2010)  Patient Instructions: 1)  Your physician recommends that you continue on your current medications as directed. Please refer to the Current Medication list given to you today. 2)  Your physician wants you to follow-up in: 6 MONTHS.  You will receive a reminder letter in the mail two months in advance. If you don't receive a letter, please call our office to schedule the follow-up appointment. Prescriptions: LISINOPRIL 10 MG TABS (LISINOPRIL) take one tablet by mouth daily  #30 x 8   Entered by:   Julieta Gutting, RN, BSN   Authorized by:   Norva Karvonen, MD   Signed by:   Julieta Gutting, RN, BSN on 09/29/2010   Method used:   Electronically to        [facility] [address] (retail)       [address]       [place], [place]  [address]       Ph: [phone] or [phone]       Fax: [phone]   RxID:   [id number]

## 2011-01-04 NOTE — Assessment & Plan Note (Signed)
Summary: emp/pt fasting/cjr   Vital Signs:  Patient profile:   70 year old female Menstrual status:  postmenopausal Height:      66 inches Weight:      141 pounds BMI:     22.84 Temp:     98.4 degrees F oral BP sitting:   140 / 90  (left arm) Cuff size:   regular  Vitals Entered By: Kathrynn Speed CMA (July 28, 2010 9:20 AM) CC: emp, fasting for labs, src Is Patient Diabetic? No     Menstrual Status postmenopausal   Primary Care Anwar Crill:  Eleonore Chiquito, MD  CC:  emp, fasting for labs, and src.  History of Present Illness: 70 year old patient who is seen today for follow-up of her recently diagnosed hypothyroidism.  She complains of some hypersomnolence, but otherwise doing reasonably well.  She has noted no changes with the supplementation.  She is followed closely by GI.  Recent evaluation has included carotid artery.  Doppler studies, as well as a head CT a to rule out significant cerebral aneurysm.  She has hypertension, dyslipidemia, and history of ongoing tobacco use.  She has a history of mild anxiety.  She has a history of coronary artery disease, which has been stable.  Denies any exertional chest pain  Preventive Screening-Counseling & Management  Alcohol-Tobacco     Smoking Status: current     Smoking Cessation Counseling: yes     Packs/Day: 0.75  Current Medications (verified): 1)  Metoprolol Succinate 50 Mg  Tb24 (Metoprolol Succinate) .Marland Kitchen.. 1 Once Daily 2)  Aspirin 81 Mg Tbec (Aspirin) .... Take One Tablet By Mouth Daily 3)  Advil 200 Mg Tabs (Ibuprofen) .... As Needed 4)  Sertraline Hcl 50 Mg Tabs (Sertraline Hcl) .... One Daily 5)  Alprazolam 0.25 Mg Tabs (Alprazolam) .... One Twice Daily P.r.n. Anxiety 6)  Lisinopril 10 Mg Tabs (Lisinopril) .... One Daily 7)  Levbid 0.375 Mg  Tb12 (Hyoscyamine Sulfate) .... Take One Pill Twice Daily 8)  Synthroid 25 Mcg Tabs (Levothyroxine Sodium) .Marland Kitchen.. 1 By Mouth Once Daily X1 Week  Then 2 By Mouth Once Daily X2weeks  Then 3 By Mouth Qd  Allergies (verified): No Known Drug Allergies  Past History:  Past Medical History: CORONARY ARTERY DISEASE (ICD-414.00) S/P PCI of the LAD in 2008, BMS HYPERLIPIDEMIA (ICD-272.4) HYPERTENSION (ICD-401.9) BACK PAIN (ICD-724.5) RAYNAUD'S SYNDROME (ICD-443.0) GASTROINTESTINAL HEMORRHAGE, HX OF (ICD-V12.79) DIVERTICULOSIS, COLON (ICD-562.10) OSTEOPENIA (ICD-733.90) ANXIETY (ICD-300.00) DIVERTICULOSIS OF COLON (ICD-562.10) Hx of UNSPECIFIED HEMORRHAGE OF GASTROINTESTINAL TRACT (ICD-578.9) HYPOTHYROIDISM (ICD-244.9) NEOPLASM UNCERTAIN BHV CNCTV&OTH SOFT TISSUE (ICD-238.1) UTI (ICD-599.0) URINARY FREQUENCY (ICD-788.41) Colonic polyps, hx of Hypothyroidism  Past Surgical History: cardiac stent placement april 2008 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 mother status post CABG in her 33s father.  History lung cancer she has 13 brothers and sisters positive for heart disease, lung cancer  sister uncle, and nephew have all had cerebral aneurysms  Social History: Reviewed history from 03/28/2009 and no changes required. Married housepainter long history of tobacco use Alcohol Use - no Drug Use - no Smoking Status:  current Packs/Day:  0.75  Review of Systems  The patient denies anorexia, fever, weight loss, weight gain, vision loss, decreased hearing, hoarseness, chest pain, syncope, dyspnea on exertion, peripheral edema, prolonged cough, headaches, hemoptysis, abdominal pain, melena, hematochezia, severe indigestion/heartburn, hematuria, incontinence, genital sores, muscle weakness, suspicious  skin lesions, transient blindness, difficulty walking, depression, unusual weight change, abnormal bleeding, enlarged lymph nodes, angioedema, and  breast masses.    Physical Exam  General:  Well-developed,well-nourished,in no acute distress; alert,appropriate and cooperative throughout examination Head:  Normocephalic and atraumatic without obvious abnormalities. No apparent alopecia or balding. Eyes:  No corneal or conjunctival inflammation noted. EOMI. Perrla. Funduscopic exam benign, without hemorrhages, exudates or papilledema. Vision grossly normal. Ears:  External ear exam shows no significant lesions or deformities.  Otoscopic examination reveals clear canals, tympanic membranes are intact bilaterally without bulging, retraction, inflammation or discharge. Hearing is grossly normal bilaterally. Nose:  External nasal examination shows no deformity or inflammation. Nasal mucosa are pink and moist without lesions or exudates. Mouth:  Oral mucosa and oropharynx without lesions or exudates.  Teeth in good repair. Neck:  No deformities, masses, or tenderness noted. Chest Wall:  No deformities, masses, or tenderness noted. Breasts:  No mass, nodules, thickening, tenderness, bulging, retraction, inflamation, nipple discharge or skin changes noted.   Lungs:  Normal respiratory effort, chest expands symmetrically. Lungs are clear to auscultation, no crackles or wheezes. Heart:  Normal rate and regular rhythm. S1 and S2 normal without gallop, murmur, click, rub or other extra sounds. Abdomen:  Bowel sounds positive,abdomen soft and non-tender without masses, organomegaly or hernias noted. Msk:  No deformity or scoliosis noted of thoracic or lumbar spine.   Pulses:  decreased right posterior tibial pulse Extremities:  No clubbing, cyanosis, edema, or deformity noted with normal full range of motion of all joints.   Neurologic:  No cranial nerve deficits noted. Station and gait are normal. Plantar reflexes are down-going bilaterally. DTRs are symmetrical throughout. Sensory, motor and coordinative functions appear intact.  reflexes revealed a  slightly slow relaxation phase Skin:  Intact without suspicious lesions or rashes Cervical Nodes:  No lymphadenopathy noted Axillary Nodes:  No palpable lymphadenopathy Inguinal Nodes:  No significant adenopathy Psych:  Cognition and judgment appear intact. Alert and cooperative with normal attention span and concentration. No apparent delusions, illusions, hallucinations   Impression & Recommendations:  Problem # 1:  CORONARY ARTERY DISEASE (ICD-414.00)  Her updated medication list for this problem includes:    Metoprolol Succinate 50 Mg Tb24 (Metoprolol succinate) .Marland Kitchen... 1 once daily    Aspirin 81 Mg Tbec (Aspirin) .Marland Kitchen... Take one tablet by mouth daily    Lisinopril 10 Mg Tabs (Lisinopril) ..... One daily  Her updated medication list for this problem includes:    Metoprolol Succinate 50 Mg Tb24 (Metoprolol succinate) .Marland Kitchen... 1 once daily    Aspirin 81 Mg Tbec (Aspirin) .Marland Kitchen... Take one tablet by mouth daily    Lisinopril 10 Mg Tabs (Lisinopril) ..... One daily  Problem # 2:  HYPERTENSION (ICD-401.9)  Her updated medication list for this problem includes:    Metoprolol Succinate 50 Mg Tb24 (Metoprolol succinate) .Marland Kitchen... 1 once daily    Lisinopril 10 Mg Tabs (Lisinopril) ..... One daily    Her updated medication list for this problem includes:    Metoprolol Succinate 50 Mg Tb24 (Metoprolol succinate) .Marland Kitchen... 1 once daily    Lisinopril 10 Mg Tabs (Lisinopril) ..... One daily  Orders: TLB-BMP (Basic Metabolic Panel-BMET) (80048-METABOL) TLB-CBC Platelet - w/Differential (85025-CBCD) TLB-Hepatic/Liver Function Pnl (80076-HEPATIC)  Problem # 3:  HYPERLIPIDEMIA (ICD-272.4)  Orders: Venipuncture (81191) TLB-Lipid Panel (80061-LIPID) TLB-BMP (Basic Metabolic Panel-BMET) (80048-METABOL) TLB-CBC Platelet - w/Differential (85025-CBCD) TLB-Hepatic/Liver Function Pnl (80076-HEPATIC) Specimen Handling (47829)  Problem # 4:  HYPOTHYROIDISM (ICD-244.9)  The following medications were  removed  from the medication list:    Synthroid 25 Mcg Tabs (Levothyroxine sodium) .Marland Kitchen... 1 by mouth once daily x1 week  then 2 by mouth once daily x2weeks then 3 by mouth qd Her updated medication list for this problem includes:    Synthroid 100 Mcg Tabs (Levothyroxine sodium) ..... One daily    The following medications were removed from the medication list:    Synthroid 25 Mcg Tabs (Levothyroxine sodium) .Marland Kitchen... 1 by mouth once daily x1 week  then 2 by mouth once daily x2weeks then 3 by mouth qd Her updated medication list for this problem includes:    Synthroid 100 Mcg Tabs (Levothyroxine sodium) ..... One daily  Orders: TLB-TSH (Thyroid Stimulating Hormone) (84443-TSH) Specimen Handling (24401)  Complete Medication List: 1)  Metoprolol Succinate 50 Mg Tb24 (Metoprolol succinate) .Marland Kitchen.. 1 once daily 2)  Aspirin 81 Mg Tbec (Aspirin) .... Take one tablet by mouth daily 3)  Advil 200 Mg Tabs (Ibuprofen) .... As needed 4)  Sertraline Hcl 50 Mg Tabs (Sertraline hcl) .... One daily 5)  Alprazolam 0.25 Mg Tabs (Alprazolam) .... One twice daily p.r.n. anxiety 6)  Lisinopril 10 Mg Tabs (Lisinopril) .... One daily 7)  Levbid 0.375 Mg Tb12 (Hyoscyamine sulfate) .... Take one pill twice daily 8)  Synthroid 100 Mcg Tabs (Levothyroxine sodium) .... One daily  Patient Instructions: 1)  Please schedule a follow-up appointment in 6 months. 2)  Limit your Sodium (Salt). 3)  Tobacco is very bad for your health and your loved ones! You Should stop smoking!. 4)  It is important that you exercise regularly at least 20 minutes 5 times a week. If you develop chest pain, have severe difficulty breathing, or feel very tired , stop exercising immediately and seek medical attention. 5)  Take calcium +Vitamin D daily. 6)  TSH in 6 weeks (244.9)

## 2011-01-04 NOTE — Miscellaneous (Signed)
Summary: Medication change  Clinical Lists Changes  David at [facility] and the pt are aware of medication change.  Benicar HCT was not approved by the pt's insurance.  Medications: Changed medication from BENICAR HCT 20-12.5 MG TABS (OLMESARTAN MEDOXOMIL-HCTZ) take one tablet by mouth daily to LISINOPRIL-HYDROCHLOROTHIAZIDE 10-12.5 MG TABS (LISINOPRIL-HYDROCHLOROTHIAZIDE) take one tablet by mouth daily - Signed Rx of LISINOPRIL-HYDROCHLOROTHIAZIDE 10-12.5 MG TABS (LISINOPRIL-HYDROCHLOROTHIAZIDE) take one tablet by mouth daily;  #30 x 8;  Signed;  Entered by: Julieta Gutting, RN, BSN;  Authorized by: Norva Karvonen, MD;  Method used: Electronically to [facility] [address], [place], [place], [place]  [address], Ph: [phone] or [phone], Fax: [phone]    Prescriptions: LISINOPRIL-HYDROCHLOROTHIAZIDE 10-12.5 MG TABS (LISINOPRIL-HYDROCHLOROTHIAZIDE) take one tablet by mouth daily  #30 x 8   Entered by:   Julieta Gutting, RN, BSN   Authorized by:   Norva Karvonen, MD   Signed by:   Julieta Gutting, RN, BSN on 01/13/2010   Method used:   Electronically to        [facility] [address] (retail)       [address]       [place], [place]  [address]       Ph: [phone] or [phone]       Fax: [phone]   RxID:   [id number]

## 2011-01-04 NOTE — Progress Notes (Signed)
Summary: Test results  Phone Note Call from Patient Call back at [facility] Phone [phone]   Caller: Patient Summary of Call: Test results Initial call taken by: Judie Grieve,  July 11, 2010 2:26 PM  Follow-up for Phone Call        Results of carotid duplex given to the pt's husband.  This pt is also seen by Dr Excell Seltzer in Cardiology.  Follow-up by: Julieta Gutting, RN, BSN,  July 11, 2010 3:45 PM

## 2011-01-04 NOTE — Letter (Signed)
Summary: EGD Instructions  [facility]  [address] [place], [place] [address]   Phone: [phone]  Fax: [phone]       [name]    1941-06-26    MRN: [id number]       Procedure Day /Date:  Tuesday 03/07/2010     Arrival Time: 12:30 pm     Procedure Time: 1:30 pm     Location of Procedure:                    _ x _ [facility] (4th Floor)   PREPARATION FOR ENDOSCOPY   On tuesday 4/5 THE DAY OF THE PROCEDURE:  1.   No solid foods, milk or milk products are allowed after midnight the night before your procedure.  2.   Do not drink anything colored red or purple.  Avoid juices with pulp.  No orange juice.  3.  You may drink clear liquids until 11:30 am, which is 2 hours before your procedure.                                                                                                CLEAR LIQUIDS INCLUDE: Water Jello Ice Popsicles Tea (sugar ok, no milk/cream) Powdered fruit flavored drinks Coffee (sugar ok, no milk/cream) Gatorade Juice: apple, white grape, white cranberry  Lemonade Clear bullion, consomm, broth Carbonated beverages (any kind) Strained chicken noodle soup Hard Candy   MEDICATION INSTRUCTIONS  Unless otherwise instructed, you should take regular prescription medications with a small sip of water as early as possible the morning of your procedure.    Additional medication instructions: Do not take Lisinopril/HCTZ morning of procedure.             OTHER INSTRUCTIONS  You will need a responsible adult at least 69 years of age to accompany you and drive you home.   This person must remain in the waiting room during your procedure.  Wear loose fitting clothing that is easily removed.  Leave jewelry and other valuables at home.  However, you may wish to bring a book to read or an iPod/MP3 player to listen to music as you wait for your procedure to start.  Remove all body piercing jewelry and leave at  home.  Total time from sign-in until discharge is approximately 2-3 hours.  You should go home directly after your procedure and rest.  You can resume normal activities the day after your procedure.  The day of your procedure you should not:   Drive   Make legal decisions   Operate machinery   Drink alcohol   Return to work  You will receive specific instructions about eating, activities and medications before you leave.    The above instructions have been reviewed and explained to me by   Ezra Sites RN  February 28, 2010 2:11 PM    I fully understand and can verbalize these instructions _____________________________ Date _________

## 2011-01-04 NOTE — Progress Notes (Signed)
Summary: lab results  Phone Note Call from Patient   Caller: Patient Call For: [name] Savers  MD Summary of Call: Please call pt with lab results at [phone]. Initial call taken by: Lynann Beaver CMA,  August 08, 2010 10:00 AM  Follow-up for Phone Call        all lab OK except thyroid level low; has patient been compliant with meds?; if so, increase synthroid to 150 mg daily and repeat in 6 weeks; Cholesterol also slightly elevated but will improve with improved thyroid studies Follow-up by: [name] Savers  MD,  August 08, 2010 12:40 PM    New/Updated Medications: SYNTHROID 150 MCG TABS (LEVOTHYROXINE SODIUM) one by mouth bid   Notified pt.

## 2011-01-04 NOTE — Miscellaneous (Signed)
Summary: [facility]  Clinical Lists Changes  Observations: Added new observation of NKA: T (02/28/2010 13:40)

## 2011-01-04 NOTE — Assessment & Plan Note (Signed)
Review of gastrointestinal problems: 1. Periampullary mass seen on endoscopy in April of 2008, 2-3 cm, somewhat ulcerated.  Biopsy showed focal ulceration, but no granulomas or malignancy was identified.  CT scan of abdomen and pelvis with IV and oral contrast also documented a 3.2 cm periampullary mass, enhancing.  The patient was on Plavix at the time of EGD.  Mucosal biopsy of the mass caused more than usual bleeding which was self-limited, although she did have some melena for 1-2 days.  Follow-up endoscopic ultrasound on Apr 17, 2007, showed a 2.5 periampullary duodenal mass located 2 cm distal to the major papilla, quite vascular.  It was oozing following multiple biopsies.  Biopsies showed spindle cell proliferation (GIST versus reactive changes).  Resection by Dr. Marilynn Rail 2008,with choledocotomy, CBD cannulation, pathologic diagnosis confirmed GIST 2. Question sigmoid diverticulitis seen on CT scan.  She completed a Cipro/Flagyl course. Was recommended to have a colonoscopy in the past but deferred. 3. Adenomatous colon polyps, colonoscopy January 2010(4 polyps, some piecemeal). recall colonoscopy at 6 month interval.  Repeat colonoscopy July 2011 found no recurrent adenomatous polyps. Next colonoscopy July 2013. 4. Right lower quadrant pain; 2011 CT scan, CBC, cemented all normal. EGD April 2030found normal postoperative stomach, mild gastritis which was negative for H. pylori on biopsies.    History of Present Illness Visit Type: Follow-up Visit Primary GI MD: Rob Bunting MD Primary Provider: Eleonore Chiquito, MD Requesting Provider: n/a Chief Complaint: Yellow stools, right sided pain History of Present Illness:     still has right sided, constant pain.  Right lower quadrant.  Eating or BMs do not change the symptoms at all.    Some nausea.  She will have spells where she is VERY fatigued.  Sometimes clammy, sweating.  her son told me today that she is not taking her medicines  very regularly.  She was found to be very hypothyroid on TSH yesterday.    the pain is fairly positional.           Current Medications (verified): 1)  Metoprolol Succinate 50 Mg  Tb24 (Metoprolol Succinate) .Marland Kitchen.. 1 Once Daily 2)  Synthroid 100 Mcg  Tabs (Levothyroxine Sodium) .Marland Kitchen.. 1 Once Daily 3)  Aspirin 81 Mg Tbec (Aspirin) .... Take One Tablet By Mouth Daily 4)  Advil 200 Mg Tabs (Ibuprofen) .... As Needed 5)  Sertraline Hcl 50 Mg Tabs (Sertraline Hcl) .... One Daily 6)  Alprazolam 0.25 Mg Tabs (Alprazolam) .... One Twice Daily P.r.n. Anxiety 7)  Lisinopril 10 Mg Tabs (Lisinopril) .... One Daily  Allergies (verified): No Known Drug Allergies  Vital Signs:  Patient profile:   70 year old female Height:      66 inches Weight:      144.50 pounds BMI:     23.41 Pulse rate:   60 / minute Pulse rhythm:   regular BP sitting:   140 / 80  (left arm) Cuff size:   regular  Vitals Entered By: June McMurray CMA Duncan Dull) (June 30, 2010 1:33 PM)  Physical Exam  Additional Exam:  Constitutional: generally well appearing Psychiatric: alert and oriented times 3 Abdomen: soft, non-tender, non-distended, normal bowel sounds    Impression & Recommendations:  Problem # 1:  Right lower quadrant pain CT scan, EGD, recent colonoscopy have not explained her pains. They actually do not sound very gastrointestinal related given that eating and moving her bowels does not make any change in the discomforts. The pain is somewhat positional. Perhaps this is adhesive related. She will  try some antispasmodic medicine to see if perhaps this is irritable bowel-like discomfort. She is severely hypothyroid and maybe that is contributing to some of her symptoms.  Patient Instructions: 1)  Trial of antispasm medicine.  Take one pill two times a day. 2)  Need to get severe hypothyroidism under control. 3)  A copy of this information will be sent to Dr. Amador Cunas. 4)  Return to see Dr. Christella Hartigan as  needed.  5)  The medication list was reviewed and reconciled.  All changed / newly prescribed medications were explained.  A complete medication list was provided to the patient / caregiver. Prescriptions: LEVBID 0.375 MG  TB12 (HYOSCYAMINE SULFATE) take one pill twice daily  #60 x 3   Entered and Authorized by:   Rachael Fee MD   Signed by:   Chales Abrahams CMA (AAMA) on 06/30/2010   Method used:   Electronically to        [facility] (retail)       [address]       [place], [place]  [address]       Ph: [phone] or [phone]       Fax: [phone]   RxID:   [id number]

## 2011-01-04 NOTE — Assessment & Plan Note (Signed)
Summary: LOSS OF APPETITE PER SON/NJR   Vital Signs:  Patient profile:   70 year old [name]:      146 pounds Temp:     98.1 degrees F oral BP sitting:   130 / 70  (right arm) Cuff size:   regular  Vitals Entered By: Duard Brady LPN (June 29, 2010 1:31 PM) CC: c/o loss of appetite , abd pain, no energy   Primary Care Provider:  Eleonore Chiquito, MD  CC:  c/o loss of appetite , abd pain, and no energy.  History of Present Illness: 70 year old patient who is seen today for follow-up.  She has a history of corneal artery disease, hypertension.  Apparently, she is no longer on statin therapy.  She is being followed closely by GI due to chronic abdominal pain.  GI follow up is scheduled for tomorrow.  She is at her the reason colonoscopy is CT abdominal scan and more recent upper panendoscopy.  This revealed mild gastritis only studies for pylori were negative.  She has been taking her medications sporadically. She is requesting Xanax for anxiety and stress.  There has been no weight loss in spite of her GI symptoms and poor appetite. she states that his sister nephew, and uncle have had cerebral aneurysms, and she was told that she needed appropriate screening studies  Allergies (verified): No Known Drug Allergies  Past History:  Past Medical History: Reviewed history from 03/28/2009 and no changes required. 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 cardiac stent placement april 2008 gastrointestinal stomal tumor of duodenum resection 10/20/2007 Cholecystectomy 1995 Tubal ligation Hysterectomy with BSO 1994 Appendectomy  Family History: Reviewed history from 03/25/2008 and no changes required. mother status post CABG in her 51s father.  History lung cancer she has 13 brothers and sisters positive for heart disease, lung cancer  sister uncle, and nephew have all had cerebral aneurysms  Review of Systems       The patient complains of anorexia, abdominal pain, and depression.  The patient denies fever, weight loss, weight gain, vision loss, decreased hearing, hoarseness, chest pain, syncope, dyspnea on exertion, peripheral edema, prolonged cough, headaches, hemoptysis, melena, hematochezia, severe indigestion/heartburn, hematuria, incontinence, genital sores, muscle weakness, suspicious skin lesions, transient blindness, difficulty walking, unusual weight change, abnormal bleeding, enlarged lymph nodes, angioedema, and breast masses.    Physical Exam  General:  Well-developed,well-nourished,in no acute distress; alert,appropriate and cooperative throughout examination Head:  Normocephalic and atraumatic without obvious abnormalities. No apparent alopecia or balding. Eyes:  No corneal or conjunctival inflammation noted. EOMI. Perrla. Funduscopic exam benign, without hemorrhages, exudates or papilledema. Vision grossly normal. Mouth:  Oral mucosa and oropharynx without lesions or exudates.  Teeth in good repair. Neck:  No deformities, masses, or tenderness noted. Lungs:  Normal respiratory effort, chest expands symmetrically. Lungs are clear to auscultation, no crackles or wheezes. Heart:  Normal rate and regular rhythm. S1 and S2 normal without gallop, murmur, click, rub or other extra sounds. Abdomen:  mild mid and right upper quadrant tenderness   Impression &  Recommendations:  Problem # 1:  ABDOMINAL PAIN-RLQ (ICD-789.03)  Problem # 2:  CORONARY ARTERY DISEASE (ICD-414.00)  The following medications were removed from the medication list:    Lisinopril-hydrochlorothiazide 10-12.5 Mg Tabs (Lisinopril-hydrochlorothiazide) .Marland Kitchen... Take one tablet by mouth daily Her updated medication list for this problem includes:    Metoprolol Succinate 50 Mg Tb24 (Metoprolol succinate) .Marland Kitchen... 1 once daily    Aspirin 81 Mg Tbec (Aspirin) .Marland Kitchen... Take one tablet by mouth daily    Lisinopril 10 Mg Tabs (Lisinopril) ..... One daily  The following medications were removed from the medication list:    Lisinopril-hydrochlorothiazide 10-12.5 Mg Tabs (Lisinopril-hydrochlorothiazide) .Marland Kitchen... Take one tablet by mouth daily Her updated medication list for this problem includes:    Metoprolol Succinate 50 Mg Tb24 (Metoprolol succinate) .Marland Kitchen... 1 once daily    Aspirin 81 Mg Tbec (Aspirin) .Marland Kitchen... Take one tablet by mouth daily    Lisinopril 10 Mg Tabs (Lisinopril) ..... One daily  Problem # 3:  HYPERLIPIDEMIA (ICD-272.4) unclear why the patient is not on a statin drug  Problem # 4:  ANXIETY (ICD-300.00)  Her updated medication list for this problem includes:    Sertraline Hcl 50 Mg Tabs (Sertraline hcl) ..... One daily    Alprazolam 0.25 Mg Tabs (Alprazolam) ..... One twice daily p.r.n. anxiety  Orders: Venipuncture ([address]) TLB-Sedimentation Rate (ESR) (85652-ESR) TLB-TSH (Thyroid Stimulating Hormone) (84443-TSH)  Complete Medication List: 1)  Metoprolol Succinate 50 Mg Tb24 (Metoprolol succinate) .Marland Kitchen.. 1 once daily 2)  Synthroid 100 Mcg Tabs (Levothyroxine sodium) .Marland Kitchen.. 1 once daily 3)  Aspirin 81 Mg Tbec (Aspirin) .... Take one tablet by mouth daily 4)  Advil 200 Mg Tabs (Ibuprofen) .... As needed 5)  Sertraline Hcl 50 Mg Tabs (Sertraline hcl) .... One daily 6)  Alprazolam 0.25 Mg Tabs (Alprazolam) .... One twice daily p.r.n. anxiety 7)  Lisinopril 10 Mg Tabs (Lisinopril)  .... One daily  Other Orders: Radiology Referral (Radiology) Specimen Handling ([address])  Patient Instructions: 1)  Please schedule a follow-up appointment in 1 month. 2)  Limit your Sodium (Salt). 3)  It is important that you exercise regularly at least 20 minutes 5 times a week. If you develop chest pain, have severe difficulty breathing, or feel very tired , stop exercising immediately and seek medical attention. Prescriptions: SYNTHROID 100 MCG  TABS (LEVOTHYROXINE SODIUM) 1 once daily Brand medically necessary #90 Tablet x 2   Entered and Authorized by:   Gordy Savers  MD   Signed by:   Gordy Savers  MD on 06/29/2010   Method used:   Print then Give to Patient   RxID:   [id number] METOPROLOL SUCCINATE 50 MG  TB24 (METOPROLOL SUCCINATE) 1 once daily  #90 x 6   Entered and Authorized by:   Gordy Savers  MD   Signed by:   Gordy Savers  MD on 06/29/2010   Method used:   Print then Give to Patient   RxID:   [id number] LISINOPRIL 10 MG TABS (LISINOPRIL) one daily  #90 x 0   Entered and Authorized by:   Gordy Savers  MD   Signed by:   Gordy Savers  MD on 06/29/2010   Method used:   Print then Give to Patient   RxID:   [id number] ALPRAZOLAM 0.25 MG TABS (ALPRAZOLAM) one twice daily p.r.n. anxiety  #60 x 0   Entered and Authorized by:   Gordy Savers  MD   Signed by:   Gordy Savers  MD on 06/29/2010   Method used:  Print then Give to Patient   RxID:   [id number] SERTRALINE HCL 50 MG TABS (SERTRALINE HCL) one daily  #90 x 0   Entered and Authorized by:   Gordy Savers  MD   Signed by:   Gordy Savers  MD on 06/29/2010   Method used:   Print then Give to Patient   RxID:   [id number]

## 2011-01-04 NOTE — Progress Notes (Signed)
Summary: Pt req a referral to ENT doctor at [facility]  Phone Note Call from Patient Call back at Home Phone [phone]   Caller: Patient Summary of Call: Pt req to get a referral for ENT doctor at [facility]. Pls advise.  Initial call taken by: Lucy Antigua,  December 06, 2010 1:13 PM    OK

## 2011-04-17 NOTE — Assessment & Plan Note (Signed)
[facility]                         GASTROENTEROLOGY OFFICE NOTE   NAME:[name], [name]                       MRN:          [id number]  DATE:04/07/2007                            DOB:          1941-10-30    PRIMARY CARDIOLOGIST:  [name], M.D.   PRIMARY CARE PHYSICIAN:  Gordy Savers, M.D.   GI PROBLEM LIST:  1. Ampullary mass seen on endoscopy in April of 2008, 2-3 cm, somewhat      ulcerated.  Biopsy showed focal ulceration, but no granulomas or      malignancy was identified.  CT scan of abdomen and pelvis with IV      and oral contrast also documented a 3.2 cm ampullary mass,      enhancing.  The patient was on Plavix at the time of EGD.  Mucosal      biopsy of the mass caused more than usual bleeding which was self-      limited, although she did have some melena for 1-2 days.  2. Question sigmoid diverticulitis seen on CT scan.  She completed a      Cipro/Flagyl course.   INTERVAL HISTORY:  I last saw Demetris while she was hospitalized at [facility].  She had just had a cardiac catheterization.  She had a  Bare metal stent placed and was on Plavix.  Since then she was  discharged.  She has had no further hematochezia or melena.  She does  have some vague right lower quadrant discomfort.  She feels something  moving inside her abdomen.  She had no fevers or chills.  She had a  diarrheal illness last week that seemed self-limited.  She was going to  see Dr. Excell Seltzer in the office at that point and he and I have discussed  her case and he was planning to stop her Plavix probably for good.  She  has rescheduled that appointment for later this week.   CURRENT MEDICATIONS:  Plavix, Lotensin, and Synthroid.   PHYSICAL EXAMINATION:  VITAL SIGNS:  5 feet 6 inches, 151 pounds, blood  pressure 132/66, pulse 80.  CONSTITUTIONAL:  Generally well-appearing.  LUNGS:  Clear to auscultation bilaterally.  HEART:  Regular rate and  rhythm.  ABDOMEN:  Soft, nontender, and nondistended with normal bowel sounds.   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   I spoke with Dr. Excell Seltzer about her while she was hospitalized and shortly  after her discharge.  The plan was to stop her Plavix.  Unfortunately  she missed that appointment, but I will have her stop her Plavix  beginning today.  She is going to be seeing him in the office later this  week.  She will have a repeat endoscopy next week.  I will start with an  endoscopic ultrasound to get a better idea of this mass.  I will proceed  with mucosal biopsies and perhaps FNA.  This endoscopically did appear  to be neoplastic and she may eventually require a resection.  I do not  think her right lower quadrant is related to this mass and we will have  to keep an eye on them.  She will be getting a set of blood tests today  including a CBC and a complete metabolic profile.     Rachael Fee, MD  Electronically Signed    DPJ/MedQ  DD: 04/07/2007  DT: 04/07/2007  Job #: [id number]   cc:   Gordy Savers, MD  [name], MD

## 2011-04-17 NOTE — Assessment & Plan Note (Signed)
[facility]                            CARDIOLOGY OFFICE NOTE   NAME:[name], [name]                       MRN:          [id number]  DATE:10/15/2007                            DOB:          27-Jul-1941    This is a patient of Dr. Excell Seltzer.  [name] is a pleasant 70 year old  married white female patient who returns today for cardiac clearance.  She was found to have a pancreatic growth and is scheduled for surgery  on Monday with Dr. Marilynn Rail at [facility].  She states that they  are hoping they do not have to do a Whipple procedure on her.   The patient has a history of coronary artery disease status post non-ST-  elevation MI secondary to high-grade LAD treated with bare metal stent  in April 2008.  She also has diffuse disease in the RCA that does not  appear to be flow-limiting, and preserved left ventricular function.  During that hospitalization, she had an endo revealing erosive changes  in the gastric antrum and duodenal lesions suspicious for neoplasms.  She also has a history of hypertension, hypothyroidism, and GI bleed,  which resolved.   She saw Dr. Excell Seltzer in August of this year, at which time she was having  some chest pressure, not exertion-related.  At that time, she had  stopped her blood pressure medications and he resumed these.  He did  order an adenosine Myoview to rule out significant ischemia, but she  cancelled this because she was having so much trouble with nausea at the  time.  Her blood pressure is under much better control.  She states she  is not having any more of the chest pain.  Denies any palpitations,  dizziness, pre-syncope, or shortness of breath.  Unfortunately, she  continues to smoke 1/2 pack a day, and she is extremely nervous about  the upcoming surgery.   ALLERGIES:  No known drug allergies.   MEDICATIONS:  1. Synthroid 75 mcg daily.  2. Aspirin 81 mg daily.  3. Prevacid 30 mg daily.  4.  Benazepril/hydrochlorothiazide 20/25 daily.  5. Metoprolol 50 mg daily.  6. Promethazine 25 mg p.r.n.   SOCIAL HISTORY:  She is married with 2 children.  She smokes 1/2 pack of  cigarettes a day.   PHYSICAL EXAM:  This is a pleasant but anxious 70 year old white female  in no acute distress.  Blood pressure 121/81, pulse 71, weight 148.  NECK:  Without JVD, HJR.  She does have a very soft left carotid bruit.  LUNGS:  Decreased breath sounds but clear anterior, posterior, and  lateral.  HEART:  Regular rate and rhythm at 77 beats per minute.  Normal S1, S2.  No murmur, rub, bruit, thrill, or heave noted.  ABDOMEN:  Soft without organomegaly, masses, lesions, or abnormal  tenderness.  EXTREMITIES:  Without cyanosis, clubbing, or edema.  She has good distal  pulses.   IMPRESSION:  1. Coronary artery disease status post non-ST-elevation myocardial      infarction, treated with bare metal stent to the left anterior  descending in April 2008 with residual disease in the right      coronary artery that was not flow-limiting, and normal left      ventricular function.  Ejection fraction 70%.  2. Hypertension.  3. Gastrointestinal bleed in April 2008, resolved.  4. Diverticulitis.  5. Duodenal lesions, suspicious for neoplasm back in April 2008.  6. Hypothyroidism.  7. Hypertension.  8. Dyslipidemia.   PLAN:  Because the patient was having some chest pain back in August  while her blood pressure was not under good control, an adenosine  Cardiolite was cancelled.  We will reschedule that prior to clearing her  for surgery.  We will have Dr. Excell Seltzer review this and decide whether or  not she can proceed with surgery scheduled for this coming Monday.   ADDENDUM:  She is encouraged to quit smoking.      Jacolyn Reedy, PA-C  Electronically Signed      Arturo Morton. Riley Kill, MD, [facility]  Electronically Signed   ML/MedQ  DD: 10/15/2007  DT: 10/16/2007  Job #: [id number]   cc:   Dr.  Marilynn Rail

## 2011-04-17 NOTE — Assessment & Plan Note (Signed)
[facility]                            CARDIOLOGY OFFICE NOTE   NAME:Camp, TELIA AMUNDSON                       MRN:          [id number]  DATE:04/10/2007                            DOB:          Jun 27, 1941    [name] presents for hospital followup after a hospitalization at  [facility] from April 5th through April 11th.  Ms. Alridge presented with  chest pain and ultimately ruled in for a myocardial infarction.  She  underwent a cardiac catheterization on April 7th that demonstrated high  grade stenosis in the mid LAD which was felt to be her culprit lesion.  She also had high grade diagonal disease in a small diagonal branch.  She had nonobstructive disease in her left circumflex and right coronary  artery.  Percutaneous coronary intervention of the mid LAD was performed  with a bare metal stent.  She has had preserved left ventricular  systolic function, as her LVEF at the time of catheterization was  estimated at 70%.   Unfortunately, during the post procedure period she developed a  significant GI hemorrhage and was managed during her hospitalization by  Dr. Christella Hartigan.  He performed endoscopy that demonstrated erosive gastritis  as well as an ampullary mass.  Biopsies were taken at that time.  The CT  scan that followed demonstrated diverticulitis as well as a 3 cm  ampullary mass.  She is scheduled for endoscopic ultrasound and repeat  biopsies to be performed next week.   From a symptomatic standpoint, she is doing relatively well.  She has  had no further melena, hematochezia or complaints of bleeding.  She has  had no chest pain or dyspnea.  She denies lightheadedness, syncope or  other complaints.  She has been able to discontinue smoking since her  hospitalization.  She has not done any formal exercise but has been  active with walking and is asymptomatic with that level of activity.  Her Plavix is on hold by Dr. Christella Hartigan for her upcoming  biopsies.   CURRENT MEDICATIONS:  1. Lotensin 20 mg daily.  2. Synthroid 75 mcg daily.  3. Lipitor 80 mg daily.  4. Aspirin 81 mg which she is currently not taking on a consistent      basis.   PHYSICAL EXAM:  She is alert and oriented, in no acute distress.  Blood  pressure 114/74, heart rate 74, respiratory rate 16.  HEENT:  Normal.  NECK:  Normal carotid upstrokes without bruits.  Jugular venous pressure  is normal.  LUNGS:  Clear to auscultation bilaterally.  HEART:  Regular rate and rhythm without murmurs or gallops.  ABDOMEN:  Soft.  There is mild right upper quadrant tenderness without  rebound or guarding.  Bowel sounds are present.  EXTREMITIES:  No clubbing, cyanosis or edema.  Peripheral pulses are 2+  and equal throughout.   ASSESSMENT:  Ms. Munley is currently stable from a cardiovascular  standpoint.  Her cardiac problem list is as follows:  1. Recent non-ST segment elevation myocardial infarction.  She had a      mild cardiac enzyme  rise in the setting of preserved left      ventricular function.  There was high grade disease in the LAD that      was treated percutaneously with a bare metal stent.  She has      completed 30 days of clopidogrel, which is now on hold for her      upcoming GI procedure.  I would like her to remain on aspirin 81 mg      daily so that there is some antiplatelet therapy on board.  I have      also asked her to add on metoprolol ER 25 mg daily in the setting      of her non-ST segment MI with some residual coronary disease.  She      should continue with her Lotensin and other medications.  2. Dyslipidemia.  She was started on Lipitor 80 mg.  Will check      followup lipids and LFTs in 12 weeks.  3. Gastrointestinal bleeding appears to have resolved.  Hemoglobin has      normalized, with recent labs showing a hemoglobin of 13.1 and a      hematocrit of 38.2.  Management per Dr. Christella Hartigan.   I will plan on seeing Ms. Maddocks back in 3 months  after her lipids and  LFTs are complete.     [name], MD     MDC/MedQ  DD: 04/10/2007  DT: 04/10/2007  Job #: [id number]   cc:   Rachael Fee, MD  Gordy Savers, MD

## 2011-04-17 NOTE — Assessment & Plan Note (Signed)
[facility]                         GASTROENTEROLOGY OFFICE NOTE   NAME:Lecompte, [name]                       MRN:          [id number]  DATE:09/30/2007                            DOB:          1941-05-30    PRIMARY CARE PHYSICIAN:  Dr. Gordy Savers.   SURGEON:  Dr. Rise Mu.   GI PROBLEM LIST:  1. Periampullary mass seen on endoscopy in April of 2008, 2-3 cm,      somewhat ulcerated.  Biopsy showed focal ulceration, but no      granulomas or malignancy was identified.  CT scan of abdomen and      pelvis with IV and oral contrast also documented a 3.2 cm      periampullary mass, enhancing.  The patient was on Plavix at the      time of EGD.  Mucosal biopsy of the mass caused more than usual      bleeding which was self-limited, although she did have some melena      for 1-2 days.  Follow-up endoscopic ultrasound on Apr 17, 2007,      showed a 2.5 periampullary duodenal mass located 2 cm distal to the      major papilla, quite vascular.  It was oozing following multiple      biopsies.  The mass does not involve the distal CVD or main      pancreatic duct.  Biopsies showed spindle cell proliferation (GIST      versus reactive changes).  Repeat CT scan in October 2008, showed      the continued intensely enhancing duodenal mass 2.7 cm.  Repeat      blood tests at that time were normal.  Normal CBC.  Normal complete      metabolic profile.  2. Question sigmoid diverticulitis seen on CT scan.  She completed a      Cipro/Flagyl course.   INTERVAL HISTORY:  I last saw Jayona two weeks ago.  She was having  abdominal pain and nausea at that time.  She admits to her appointment  with Dr. Marilynn Rail.  I repeated the CT scan that I think he was planning  to do anyway, and it showed the continued mass in her duodenum.  The  workup of that is summarized above.  Her abdominal pain seems to have  improved, but she is still having nausea on a nearly  daily basis.  She  has not had frank classic acid reflux symptoms.  She went to see Dr.  Marilynn Rail and brought her disk.  She was somewhat frustrated with that  visit, in that she was not able to see him.  She left his office before  he was able to get in to see her.   PHYSICAL EXAMINATION:  VITAL SIGNS:  Weight 149 pounds, which is up 2  pounds since her last visit.  Blood pressure 104/62, pulse 80.  GENERAL:  Well-appearing.  ABDOMEN:  Soft, nontender, non-distended.  No masses.   ASSESSMENT/PLAN:  A 70 year old woman with nausea, peri-ampullary mass  as summarized above:  Upper endoscopies have not seen  any obvious cause  for her nausea except for this mass in her duodenum.  It is not very  large, but perhaps it is causing some intermittent duodenal obstruction  that could be contributing to her nausea.  I have given her samples of  proton pump inhibitor, for her to take it 20-30 minutes prior to her  breakfast on a daily basis.  I reiterated again that she is to follow up  with Dr. Marilynn Rail, as she may require surgery for this duodenal mass.   FOLLOWUP:  She will return to see me in four to six weeks or sooner if  needed.     Rachael Fee, MD  Electronically Signed    DPJ/MedQ  DD: 09/30/2007  DT: 09/30/2007  Job #: [id number]   cc:   Rise Mu, M.D.  Gordy Savers, MD

## 2011-04-17 NOTE — Assessment & Plan Note (Signed)
[facility]                            CARDIOLOGY OFFICE NOTE   NAME:[name], [name] NEWSON                       MRN:          [id number]  DATE:07/31/2007                            DOB:          27-Jan-1941    [name] returned for follow up at the [facility] Cardiology office on  July 31, 2007.  I received a call from [name]'s sister over the  weekend regarding concern over the patient.  [name] was in  [place], [place], visiting her sister-in-law, and she missed a  few doses of her antihypertensive medications.  She developed headaches,  shortness of breath and chest pain.  She checked her blood pressure and  had marked elevation of blood pressure with systolic readings greater  than 200 and diastolic readings of approximately 100 mmHg.  She was  treated in the emergency department where she received intravenous doses  of hydralazine until her blood pressure came down to a reasonable level.  She was advised to resume her home blood pressure medications and was  discharged home after observation over night in the emergency  department.  Since her return home, she has continued to have elevated  blood pressure readings.  She has intermittent headaches and states that  she can feel when her blood pressure is high.  She has had a few  episodes of chest pain that feel like pressure across the chest and have  not been consistently related to exertion.  Her breathing has been  stable.  She has not had any lightheadedness, syncope or palpitations.  She has not been physically active.  She has started smoking cigarettes  again.   CURRENT MEDICATIONS:  1. Synthroid 75 mcg daily.  2. Lipitor 80 mg daily.  3. Metoprolol ER 25 mg daily.  4. Benazepril/HCTZ 20/12.5 mg daily.  5. Aspirin 81 mg daily.   ALLERGIES:  NKDA.   EXAMINATION:  She is alert and oriented, in no acute distress.  Weight  is 154 pounds, blood pressure is 182/86, heart rate 65,  respiratory rate  16.  HEENT:  Normal.  NECK:  Normal carotid upstrokes with a very soft left carotid bruit.  Jugular venous pressure is normal.  LUNGS:  Clear to auscultation bilaterally.  HEART:  Regular rate and rhythm.  There are no murmurs or gallops.  ABDOMEN:  Soft, nontender, no organomegaly.  EXTREMITIES:  No clubbing, cyanosis or edema.  Peripheral pulses are 2+  and equal throughout.   EKG:  Normal sinus rhythm with a nonspecific T wave abnormality and  right atrial enlargement.   ASSESSMENT:  1. Coronary artery disease.  [name] underwent stenting of the mid      left anterior descending with a bare metal stent back in April.      She had modest disease in the left circumflex and right coronary      artery.  Her left ventricular systolic function was preserved.  I      am concerned about her recurrent chest pain.  I am going to      escalate her medical therapy  and increase her metoprolol from 25 to      50 milligrams as well as increase her hydrochlorothiazide from 12.5      to 25 milligrams.  I am also going to have her undergo an Adenosine      Myoview stress study to rule out a significant area of ischemia.      If she continues to have chest discomfort even with aggressively      treating her blood pressure we will need to consider repeat cardiac      catheterization study.  2. Dyslipidemia.  She is on Lipitor 80 milligrams.  Will continue her      current therapy and repeat lipids and LFTs.  3. Severe hypertension.  She is currently on 3-drug therapy with      markedly elevated systolic blood pressure.  As above, changes to      antihypertensive regimen were made with doubling of her metoprolol      ER and hydrochlorothiazide.  She should continue on her current      dose of benazepril.  I will check a renal ultrasound to rule out      significant renal artery stenosis in this patient with known      atherosclerotic vascular disease.  4. For followup, I would  like to see [name] back in 3 months.  I      will follow up with her by telephone after the results of her      studies are available.  I have asked her to continue to monitor her      home blood pressure and if her blood pressures remain significantly      elevated we will need to bring her back sooner.     [name]. [name] Seltzer, MD  Electronically Signed    MDC/MedQ  DD: 07/31/2007  DT: 08/01/2007  Job #: [id number]   cc:   [name] Savers, MD

## 2011-04-17 NOTE — Assessment & Plan Note (Signed)
[facility]                         GASTROENTEROLOGY OFFICE NOTE   NAME:Gaunt, [name]                       MRN:          [id number]  DATE:09/09/2007                            DOB:          1941/05/07    PRIMARY CARE PHYSICIAN:  Dr. Gordy Savers.   GI PROBLEM LIST:  1. Periampullary mass seen on endoscopy in April of 2008, 2-3 cm,      somewhat ulcerated.  Biopsy showed focal ulceration, but no      granulomas or malignancy was identified.  CT scan of abdomen and      pelvis with IV and oral contrast also documented a 3.2 cm      periampullary mass, enhancing.  The patient was on Plavix at the      time of EGD.  Mucosal biopsy of the mass caused more than usual      bleeding which was self-limited, although she did have some melena      for 1-2 days.  Follow-up endoscopic ultrasound on Apr 17, 2007,      showed a 2.5 periampullary duodenal mass located 2 cm distal to the      major papilla, quite vascular.  It was oozing following multiple      biopsies.  The mass does not involve the distal CVD or main      pancreatic duct.  Biopsies showed spindle cell proliferation (GIST      versus reactive changes).  2. Question sigmoid diverticulitis seen on CT scan.  She completed a      Cipro/Flagyl course.   INTERVAL HISTORY:  I last saw [name] this past May.  She met with Dr.  Rise Mu to discuss the periampullary lesion described above.  She was relatively close to an acute myocardial infarction, and so I  believe he elected to repeat a CAT scan around now.  She is actually  scheduled to have that done today, but she called my office yesterday,  saying that she has been having a lot of abdominal pain and nausea for  the past two to three weeks, and we arranged for her to come in for  evaluation, after calling in Phenergan.  She tried 25 mg of Phenergan  last night and said that knocked her out overnight.  She still has  nausea this  morning.  She describes her pain as a mid to right upper  quadrant epigastric discomfort, somewhat radiating around to the back.  She has had a lot of nausea.  She has been eating quite little for the  past two to three weeks.  She has had no change in her bowel habits.  She has had no vomiting.  She has not noticed distention in her abdomen.   PHYSICAL EXAMINATION:  VITAL SIGNS:  Weight 147 pounds, blood pressure  122/70, pulse 80.  CONSTITUTIONAL:  Generally well-appearing.  ABDOMEN:  Soft, nontender, non-distended.  Normal bowel sounds.   ASSESSMENT/PLAN:  A 70 year old woman with a periampullary mass, seen by  esophagogastroduodenoscopy, endoscopic ultrasound and recent CAT scan.  Biopsies suggested spindle cells, now nausea and abdominal  pain for  three weeks.  It is not clear if this lesion is playing any role in her  symptoms currently, but I will arrange for her to have a CT scan, IV  normal contrast done.  This was to be done today anyway under the  direction of Dr. Marilynn Rail, to follow up this lesion.  I recommend today  a bland diet, pushing fluids.  She got a CBC and complete metabolic  profile just prior to this visit, and those results are not back yet,  but I am adding an amylase and lipase, to check for possible  pancreatitis as well.  I gave her a prescription for Tylox #30 pills.   FOLLOWUP:  She will return to see me in approximately two weeks' time,  or sooner if needed.  I will communicate the results if the CAT scan  with Dr. Marilynn Rail.     Rachael Fee, MD  Electronically Signed    DPJ/MedQ  DD: 09/09/2007  DT: 09/09/2007  Job #: [id number]   cc:   Gordy Savers, MD  Rise Mu, M.D.

## 2011-04-20 NOTE — Assessment & Plan Note (Signed)
[facility]                                 ON-CALL NOTE   NAME:Young, [name] LUEBKE                       MRN:          [id number]  DATE:03/21/2007                            DOB:          August 04, 1941    SUMMARY OF HISTORY:  I received an answering service alpha page on Ms.  [name] at approximately 5:39 on March 21, 2007.  Over the next 30  minutes, I called their house 4 times.  The 4th time someone answered  the phone.  I asked to speak to [name] after I identified myself.  The son, Shylo Zamor, answering the phone was initially rude and would  not let me speak to the patient.  I again explained to him that I was  paged by the answering service stating that [name] had a question,  and that I would like to talk with her.   [name] got on the phone.  Again, I identified myself as Dr. Earmon Phoenix  physician assistant, Joellyn Rued, and I understood that she had a  question.  She states that she did not call the answering service, it  must have been her husband.  I asked her if she was having any  difficulties.  She said that she had been nauseated since yesterday.  She further elaborates she has not had any chest discomfort, shortness  of breath, vomiting, or diaphoresis.  She has not had to use  nitroglycerin.  She thought it initially might be due to some of the  medications, and she was going to see how she did overnight.  She had  not called her primary care physician.  I recommended that she be  evaluated in the emergency room because it was Friday evening at  approximately quarter to 7.  The patient did not want to come to the  emergency room.  She stated that she would see how she felt tomorrow  morning.  I told that we would be happy to evaluate her if she chose to  come to the emergency room, and to please call us back if she was coming  to the emergency room so that we may let the emergency room know to  begin evaluation on her arrival.   Her son took the phone back, and again  was very rude.  He asked me to repeat the recommendations that I  reviewed with his mother.  I explained to him that his mother is  refusing to come to the emergency room, as that would be the only way to  evaluate her on Friday evening after the office had closed.  He further  elaborates that his mother did not look well, and he will take care of  it.  I again advised Mr. Kahler that if she should come to the emergency  room, to please call us and let us know of her pending arrival so that  we may notify the emergency room, and begin evaluation as soon as she  arrives.      Joellyn Rued, PA-C  Electronically Signed  Gordy Savers, MD  Electronically Signed   EW/MedQ  DD: 03/21/2007  DT: 03/22/2007  Job #: [id number]

## 2011-04-20 NOTE — Discharge Summary (Signed)
[name], [name]                ACCOUNT NO.:  [id number]   MEDICAL RECORD NO.:  [id number]          PATIENT TYPE:  INP   LOCATION:  2921                         FACILITY:  [facility]   PHYSICIAN:  [name], MD  DATE OF BIRTH:  Feb 12, 1941   DATE OF ADMISSION:  03/08/2007  DATE OF DISCHARGE:                               DISCHARGE SUMMARY   PRIMARY CARDIOLOGIST:  [name], M.D.   PRIMARY CARE PHYSICIAN:  Gordy Savers, M.D.   PROCEDURE PERFORMED:  1. Cardiac catheterization on March 10, 2007.      a.     Two vessel coronary artery disease with diffuse disease in       the right coronary artery that does not appear to be flow limiting       and a high grade focal lesion in the mid left anterior descending       as well as the first diagonal branch of the left anterior       descending.  Preserve left ventricular function.      b.     PCI of the mid LAD using a bare-metal stent 2.25 x 60 mm       express stent.  2. EGD dated March 11, 2007 by Dr. Rob Bunting.      a.     Erosive changes in the gastric antrum, biopsies taken for       CLO testing.  Abnormal major papula.  One of these sites is the       cause of a recent melena.  Blood thinners needed for AML likely to       contribute, but are necessary.   PRIMARY DISCHARGE DIAGNOSES:  1. Non-ST elevated myocardial infarction secondary to high grade left      anterior descending stenosis treated with percutaneous coronary      intervention (bare-metal stent).  2. Duodenal lesions suspicious for neoplasm.  3. Diverticulitis.  4. Gastrointestinal bleed resolved.   SECONDARY DIAGNOSES:  1. Hypothyroidism.  2. Hypertension.   HISTORY OF PRESENT ILLNESS:  A 70 year old female with a past medical  history as stated above who presented to the emergency department for  further evaluation secondary to chest pain.  The patient had a sudden  onset of chest pain approximately 1 p.m. the day of admission.  She  took  aspirin and went to bed.  However, the chest pain was still present  after she woke up.  She characterized the pain as sharp, substernal  chest pain without radiation, but she did state that her left arm felt  heavy.  On arrival to the emergency department the patient was treated  with nitroglycerin and the chest pain resolved.  Her initial cardiac  biomarkers revealed a troponin of 0.09 with a CK-MB of 3.7.  EKG  revealed normal sinus rhythm with nonspecific ST changes with no  evidence of acute injury.  However, the second set of point of care  biomarkers obtained while the patient was still in the emergency room  revealed a troponin of 0.21 with a CK-MB of 5.8.  She had no further  chest discomfort, but was admitted secondary to symptoms and elevated  troponin.   The patient was diagnosed with a non-ST elevated myocardial infarction,  although she was symptom free and without complaints after  nitroglycerin, the patient was admitted to telemetry and started on  Lovenox and aspirin.  Continued to cycle cardiac enzymes, she was also  started on metoprolol 25 mg twice a day along with her home medications.   The evening of the day of admission, the patient had sudden onset of  nausea and transient loss of consciousness with complaints of  indigestion.  The patient was brought to ICU.  Repeat EKG showed  persistent T-wave inversion in the lateral leads, but mild improvement  from anterior T-wave inversion compared to earlier EKGs that day.  The  patient's symptoms did improve with Zofran and she was currently  asymptomatic and was found to have the event caused by vasovagal event.   The patient was taken to the cardiac catheterization laboratory on March 10, 2007 and cardiac catheterization was completed for Dr. Tonny Bollman  as described above.  The patient had a bare-metal stent placed to the  mid LAD.  The patient was found to have some mild edema with a  hemoglobin of 7.8.   The following morning on March 11, 2007, a GI consult  was obtained secondary to acute GI bleed in the setting of Lovenox,  Integrilin, aspirin, and Plavix.  The Lovenox and Integrilin were  discontinued on March 10, 2007.  However, secondary to the GI bleed, the  patient did have followup evaluation per Dr. Larae Grooms.   The patient did have an EGD as described above revealing erosive changes  in the gastric antrum along with abnormal mucosa at the major papula.  The patient was started on Protonix and was advised that she could  continue Plavix and aspirin.  The results have found that the patient  had diverticulitis.  Also the patient had a CT scan on March 12, 2007,  revealing a shared duodenal and ampullary mass that had been biopsied  earlier by EGD.  The CT scan also was positive for diverticulitis along  with a biopsy showing ulcer, no neoplasm was identified.  The patient  was placed on Flagyl and Cipro with followup appointments to be made  with Dr. Christella Hartigan for repeat biopsy if necessary.  The patient was also  transfused with 2 units of packed red blood cells with consistent rise  in hemoglobin and hematocrit thereafter with a hemoglobin of 11.1 on  March 13, 2007.   On March 14, 2007, the patient was seen and examined by Dr. Tonny Bollman and found to be stable for discharge.  The patient will followup  with Dr. Christella Hartigan for continuing GI evaluation and procedures at his  discretion.  The patient was safe to continue 30 days of Plavix status  post bare-metal stent and continue PPI treatment.  She was also to  continue her Cipro and Flagyl for diverticulitis x2 weeks.  On the day  of discharge, the patient was found to be up walking the hall with RN  and appeared to be doing very well with no further complaints of  discomfort.   DISCHARGE LABORATORY DATA:  Hemoglobin 10.5, hematocrit 31.3, white  blood cells 6, platelets 190.  Sodium 144, potassium 3.7, chloride 111, CO2 27, BUN  4, creatinine 0.8, glucose 108.   PHYSICAL EXAMINATION:  VITAL SIGNS:  Blood pressure systolic 119-  152/70s, heart rate 58, respirations 16.   FOLLOWUP PLANS AND APPOINTMENTS:  1. The patient is scheduled to see Dr. Tonny Bollman on May 1 at 11      a.m.  2. She is scheduled to see Dr. Rob Bunting on May 5 at 10 a.m.  3. The patient has been given post cardiac catheterization and PCI      home directions with particular emphasis on right groin site for      evidence of bleeding hematoma or infection.  4. The patient has been advised to report any signs of bleeding or      dark tarry stools to Dr. Christella Hartigan post discharge.  5. The patient has been advised to continue her current medication      regimen as at home with the addition of Cipro, Flagyl, Lipitor,      Plavix, and Protonix.  Prescriptions are provided.   DISCHARGE MEDICATIONS:  1. Protonix 40 mg 1 p.o. daily (prescription provided).  2. Cipro 500 mg b.i.d. for 7 days (prescription provided).  3. Metronidazole 250 mg t.i.d. x14 days (prescription provided).  4. Plavix 75 mg daily x1 month.  5. Enteric-coated aspirin 325 once a day.  6. Lotensin 20 mg once a day.  7. Lipitor 80 mg once a day (prescription provided).  8. Colace 200 mg daily.  9. Levothyroxine 75 mcg daily.  10.Nitroglycerin 0.4 mg p.r.n. pain (prescription provided).   ALLERGIES:  NO KNOWN DRUG ALLERGIES.   TIME SPENT:  Time spent with the patient to include physician time 35  minutes.      Bettey Mare. Lyman Bishop, NP      [name], MD  Electronically Signed    KML/MEDQ  D:  03/14/2007  T:  03/14/2007  Job:  [id number]   cc:   Rachael Fee, MD  Gordy Savers, MD

## 2011-04-20 NOTE — H&P (Signed)
NAMEWALLY, [name]                ACCOUNT NO.:  [id number]   MEDICAL RECORD NO.:  [id number]          PATIENT TYPE:  EMS   LOCATION:  ED                           FACILITY:  [facility]   PHYSICIAN:  Audery Amel, MD    DATE OF BIRTH:  [birth year]   DATE OF ADMISSION:  03/07/2007  DATE OF DISCHARGE:                              HISTORY & PHYSICAL   CHIEF COMPLAINT:  Chest pain.   HISTORY OF PRESENT ILLNESS:  The patient is a 70 year old female with a  past medical history notable for hypertension who presents to the  emergency department for further evaluation of chest pain.  The patient  states her pain had onset at approximately 1:00 p.m. this afternoon  while at home.  She took 2 aspirin and went bed.  However, the chest  pain was still present upon awakening.  She characterized the pain as  sharp substernal chest pain.  She denies radiation, but stated that her  left arm did feel somewhat heavy.  There was associated nausea and  vomiting.  She denies any shortness of breath, diaphoresis, palpitations  or orthopnea.  The patient denies any previous episodes prior to this  presentation.  On arrival to the emergency department, the patient was  treated with nitroglycerin, after which her chest pain resolved.  Her  initial point of care biomarkers revealed a troponin of 0.09 and CK-MB  of 3.7.  Her EKG revealed normal sinus rhythm with nonspecific ST  changes with no evidence of acute injury.  A second set of point of care  biomarkers was obtained while the patient was in the emergency  department which did reveal a troponin of 0.21 and CK-MB of 5.8.  She  continued to remain chest pain-free and is otherwise without complaints.   PAST MEDICAL HISTORY:  1. Hypothyroidism.  2. Hypertension.  3. Status post cholecystectomy.  4. Status post bilateral tubal ligation.  5. Status post total abdominal hysterectomy and bilateral salpingo      oophorectomy.   FAMILY HISTORY:  Her mother  underwent CABG in her 43's.  She has 13  brothers and sisters, one of which underwent PCI followed by CABG.  There is a history of lung cancer in the family in one brother and her  father.   SOCIAL HISTORY:  The patient lives in [place] with her husband.  She  is a Surveyor, minerals by profession.  She smokes approximately one pack per  day and has done so for approximately 30 years.  She denies any alcohol  or illicit substance.   ALLERGIES:  NO KNOWN DRUG ALLERGIES.   MEDICATIONS:  1. Benazepril/HCT 20/12.5 daily.  2. Synthroid 75 mcg p.o. daily.   REVIEW OF SYSTEMS:  As per HPI, otherwise complete review of systems is  negative.   PHYSICAL EXAMINATION:  VITAL SIGNS:  Blood pressure is 126/70, heart  rate is 79, O2 sat is 97% on 2 liters nasal cannula and temperature is  98.5.  She weighed 69 kg.  GENERAL:  The patient is alert and oriented x3, in no acute stress,  pleasantly conversant.  NECK:  Supple, full range of motion, no JVD.  Carotid upstrokes are  equal and symmetric bilaterally without audible bruit.  There is no  palpable thyromegaly.  CHEST:  Clear to auscultation bilaterally.  CARDIOVASCULAR:  Examination reveals a nondisplaced PMI in left  midclavicular line.  S1-S2 are normal with no audible murmurs, rubs or  gallops.  ABDOMEN:  Soft, nontender and nondistended.  Positive bowel sounds.  No  hepatosplenomegaly.  EXTREMITIES:  Reveal no clubbing, cyanosis or edema.  There are 2+  peripheral pulses, equal and symmetric bilaterally.  SKIN:  Skin is warm, dry and intact with no ulcerations or xanthoma.  NEUROLOGIC:  Grossly nonfocal.   DATA REVIEWED:  EKG - normal sinus rhythm with nonspecific ST changes.   BMP - sodium is 137, potassium is 3.6, chloride is 101, CO2 is 27, BUN  is 10, creatinine is 0.3 and glucose is 127.  White count is 8,  hematocrit is 38 and platelet count is 310. INR is 1 and PTT is 26.  A  lipid profile performed on February5,2008 revealed  a total  cholesterol of 299, triglycerides of 217, HDL of 42 and LDL of 214.   IMPRESSION:  1. Acute coronary syndrome.  2. Hypertension.  3. Hyperlipidemia.  4. Hypothyroidism.   PLAN:  On arrival to the emergency department, her initial troponin was  0.09; however, a subsequent set of point of care markers revealed a  troponin of 0.21.  This, combined with the nonspecific ST changes on EKG  are consistent with a non-ST-elevation myocardial infarction.  Currently  she is symptom-free and without complaints.  We will admit the patient  to a telemetry bed for ongoing evaluation and management.  The patient  was not initially treated with an antithrombin agent in the emergency  department.  Therefore, we will initiate enoxaparin 1 mg/kg q. 12h.  The  patient had taken aspirin earlier in the day and will continue with this  therapy.  On presentation her blood pressure was quite elevated;  however, now is under reasonable control.  Will initiate metoprolol 25  mg twice daily.  Continue her benazepril and hydrochlorothiazide.  As  mentioned previously, the patient had a lipid profile checked in  February of this year which revealed a significantly elevated LDL  cholesterol.  She is currently not on any treatment.  We will initiate  atorvastatin 80 mg once daily and recheck her lipid profile in morning.  We will continue to cycle her biomarkers every 8 hours with serial  EKG's.  Will transfer the patient to [facility] in anticipation that she  will require cardiac catheterization.  Given that she has had  longstanding uncontrolled hypertension, can consider a transthoracic  echocardiogram to assess her LV function and structure.      Audery Amel, MD  Electronically Signed     SHG/MEDQ  D:  03/08/2007  T:  03/08/2007  Job:  [id number]   cc:   Gordy Savers, MD  [address] [place]  [place] [address]

## 2011-04-20 NOTE — Cardiovascular Report (Signed)
[name], [name]                ACCOUNT NO.:  [id number]   MEDICAL RECORD NO.:  [id number]          PATIENT TYPE:  INP   LOCATION:  2807                         FACILITY:  [facility]   PHYSICIAN:  [name], MD  DATE OF BIRTH:  1941/05/01   DATE OF PROCEDURE:  03/10/2007  DATE OF DISCHARGE:                            CARDIAC CATHETERIZATION   PROCEDURES:  Left heart catheterization, selective coronary angiography,  left ventricular angiography, PTCA and stenting of the mid LAD and  StarClose of the right femoral artery.   INDICATIONS:  [name] is a 70 year old woman with no prior cardiac  history.  She presented with an acute coronary syndrome and ultimately  ruled in for a myocardial infarction.  She had dynamic EKG changes and  was treated with Lovenox and Integrilin.  She stabilized over the first  48 hours of her admission and was referred for urgent cardiac  catheterization.   DESCRIPTION OF PROCEDURE:  Risks and indications for the procedure were  explained to the patient.  Informed consent was obtained.  The right  groin was prepped, draped and anesthetized with 1% lidocaine.  Using  modified Seldinger technique a 6-French sheath was placed in the right  femoral artery.  Standard views of the left and right coronary arteries  were taken with 6-French preformed Judkins catheters.  Following  selective coronary angiography an angled pigtail catheter was inserted  into the left ventricle and pressures were recorded.  A 30 degrees RAO  left ventriculogram was performed and a pullback across the aortic valve  was done.   At the conclusion of the diagnostic procedure, I elected to intervene on  the mid LAD.  There was a high-grade 80% plaque that was irregular and I  suspected was her culprit lesion.  She had received subcutaneous full  dose Lovenox approximately 9 hours prior and she was given a 0.3 mg/kg  intravenous bolus of Lovenox.  She was on Integrilin as well.   An XBLAD  3.5-mm side-hole guiding catheter was inserted and a BMW wire was passed  beyond the lesion without much difficulty into the distal LAD.  Lesion  was predilated with a 2.0 x 12 mm Maverick balloon up to 6 atmospheres.  The balloon was well expanded.  I elected to stent the vessel with a  2.25 x 16 mm Express stent which expanded well and was deployed at 12  atmospheres.  Following stent deployment there was TIMI III flow in the  vessel with no residual stenosis.  I elected to post dilate the stent  with a 2.25 x 12 mm Quantum Maverick balloon which was inflated to 20  atmospheres on two serial inflations.  At the conclusion of the  procedure there was TIMI III flow throughout the LAD.  The stent  appeared well expanded.   At the conclusion of the procedure, a StarClose device was used to seal  the right femoral arteriotomy.   FINDINGS:  Aortic pressure 104/63 with a mean of 81, left ventricular  pressure is 112/1.   Left mainstem has nonobstructive plaque and mild calcification.  It  bifurcates into the LAD and left circumflex.   The LAD is mildly calcified in its proximal portions.  There is  nonobstructive disease throughout the proximal LAD.  The mid-LAD just  beyond the first diagonal branch has a 80%  fairly focal stenosis.  The  remaining portions of the mid and distal LAD have diffuse nonobstructive  disease.  The diagonal branch which arises after the first septal  perforator in the mid portion of the vessel has diffuse severe disease  throughout.  It is a very small vessel approximately 1.0 to 1.5 mm in  diameter.  The tightest stenosis in that vessel was approximately 90%.   The left circumflex is medium caliber and gives off a medium size first  OM.  The circumflex system is also diffusely diseased.  The first OM  branch has a 30-40% proximal stenosis.  The mid circumflex has diffuse  nonobstructive disease.   The right coronary artery is a large dominant  vessel.  It is also  diffusely diseased.  There are 25-40% lesions throughout the proximal  mid and distal portions of the vessel and the vessel has an ectatic  appearance.  It terminates in a medium sized PDA branch and a large  posterior AV segment that gives off a small first PL branch and a large  second posterolateral branch.  The terminal branches of the right  coronary artery have no significant angiographic disease.   Left ventricular function shows a hyperdynamic left ventricle with a  left ventricular ejection fraction estimated at 70%.  There is no mitral  regurgitation.   ASSESSMENT:  1. Two-vessel coronary artery disease with diffuse disease in the      right coronary artery that does not appear to be flow limiting and      a high-grade focal lesion in the mid left anterior descending as      well as well as the first diagonal branch of the left anterior      descending.  2. Preserved left ventricular function.   PLAN:  As described above PCI of the mid-LAD was performed as this was  thought to be the culprit vessel based on the angiographic appearance as  well as the EKG changes in the anterior leads.  A bare metal stent was  placed.  [name] should continue on dual antiplatelet therapy for 30  days and then lifelong aspirin.  Integrilin will be continued for 18  hours.  She was given 300 mg of clopidogrel in the cath laboratory.      [name], MD  Electronically Signed     MDC/MEDQ  D:  03/10/2007  T:  03/10/2007  Job:  [id number]

## 2011-05-18 ENCOUNTER — Other Ambulatory Visit: Payer: Self-pay | Admitting: Internal Medicine

## 2011-05-18 DIAGNOSIS — Z1231 Encounter for screening mammogram for malignant neoplasm of breast: Secondary | ICD-10-CM

## 2011-05-30 ENCOUNTER — Ambulatory Visit: Payer: Self-pay

## 2011-06-27 ENCOUNTER — Encounter: Payer: Self-pay | Admitting: Gastroenterology

## 2011-06-27 ENCOUNTER — Ambulatory Visit (INDEPENDENT_AMBULATORY_CARE_PROVIDER_SITE_OTHER): Payer: Medicare Other | Admitting: Gastroenterology

## 2011-06-27 VITALS — BP 122/78 | HR 72 | Ht 66.0 in | Wt 135.8 lb

## 2011-06-27 DIAGNOSIS — R109 Unspecified abdominal pain: Secondary | ICD-10-CM

## 2011-06-27 NOTE — Progress Notes (Signed)
Review of gastrointestinal problems:  1. Periampullary mass seen on endoscopy in April of 2008, 2-3 cm, somewhat ulcerated. Biopsy showed focal ulceration, but no granulomas or malignancy was identified. CT scan of abdomen and pelvis with IV and oral contrast also documented a 3.2 cm periampullary mass, enhancing. The patient was on Plavix at the time of EGD. Mucosal biopsy of the mass caused more than usual bleeding which was self-limited, although she did have some melena for 1-2 days. Follow-up endoscopic ultrasound on Apr 17, 2007, showed a 2.5 periampullary duodenal mass located 2 cm distal to the major papilla, quite vascular. It was oozing following multiple biopsies. Biopsies showed spindle cell proliferation (GIST versus reactive changes). Resection by Dr. Marilynn Rail 2008,with choledocotomy, CBD cannulation, pathologic diagnosis confirmed GIST  2. Question sigmoid diverticulitis seen on CT scan. She completed a Cipro/Flagyl course. Was recommended to have a colonoscopy in the past but deferred.  3. Adenomatous colon polyps, colonoscopy January 2010(4 polyps, some piecemeal). recall colonoscopy at 6 month interval. Repeat colonoscopy July 2011 found no recurrent adenomatous polyps. Next colonoscopy July 2013.  4. Right lower quadrant pain; 2011 CT scan, CBC, cemented all normal. EGD April 20102found normal postoperative stomach, mild gastritis which was negative for H. pylori on biopsies.    HPI: This is a very pleasant 70 year old woman whom I last saw about a year ago. She has the same right-sided abdominal pains which she has complained about for the past 2-3 years. In that time she has had 2 CT scans, EGD and colonoscopy without an obvious explanation for her discomfort. The pain is a nuisance but it does not keep her from any of her social obligations and does not keep her from good sleep at night. She is reluctant to take even Tylenol for this pain  She is still having the RLQ pains. Stays sore  pretty much all the time. Can hurt more after eating.  No radiation.  Overall stable weight.  No real changes in the pain with BMs.  She has had some intermittent black stools, maybe 3 times in the past 6 months.  She takes advil pretty rarely.  Takes 81ASA about every other day.  The pain seems to be better laying down.  Bending over can bring it on.    Past Medical History:   Coronary atherosclerosis of unspecified type o*              Other and unspecified hyperlipidemia                         Unspecified essential hypertension                           Backache, unspecified                                        Raynaud's syndrome                                           Personal history of other diseases of digestiv*              Diverticulosis of colon (without mention of he*              Disorder of  bone and cartilage, unspecified                  Anxiety state, unspecified                                   Diverticulosis of colon (without mention of he*              Hemorrhage of gastrointestinal tract, unspecif*              Unspecified hypothyroidism                                   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*              Urinary tract infection, site not specified                  Urinary frequency                                            Personal history of colonic polyps                           Hypothyroidism                                              Past Surgical History:   CORONARY ANGIOPLASTY WITH STENT PLACEMENT                    GASTROINTESTINAL STOMAL TUMOR RESECTION                      CHOLECYSTECTOMY                                              TUBAL LIGATION                                               ABDOMINAL HYSTERECTOMY                                       APPENDECTOMY                                                 reports that she has been smoking.  She has never used smokeless tobacco. She reports that she does not drink  alcohol or use illicit drugs.  family history includes Cerebral aneurysm in her sister; Heart disease in her mother; and Lung cancer in her father.    Current medicines and allergies were reviewed in [facility]    Physical Exam: BP 122/78  Pulse 72  Ht 5\' 6"  (1.676 m)  Wt 135 lb 12.8 oz (61.598 kg)  BMI 21.92 kg/m2 Constitutional: generally well-appearing Psychiatric: alert and oriented x3 Abdomen: soft, nontender, nondistended, no obvious ascites, no peritoneal signs, normal bowel sounds     Assessment and plan: 70 y.o. female with chronic right-sided abdominal pain likely adhesive related  I explained to her and her son she has had multiple tests including CT scans, EGD, colonoscopy none of which have been able to explain her right-sided abdominal pains. I   suspect that these pains are adhesive related, perhaps muscular, abdominal wall pain. I recommended she try taking 1-2 Tylenol twice a day. She will do this for the next month or 2 and if that is not working she may be willing to try a prescription for tramadol. She does know if this chronic pain significantly changes she should call.

## 2011-06-27 NOTE — Patient Instructions (Addendum)
One of your biggest health concerns is your smoking.  This increases your risk for most cancers and serious cardiovascular diseases such as strokes, heart attacks.  You should try your best to stop.  If you need assistence, please contact your PCP or Smoking Cessation Class at [facility] [phone]) or [facility] (1-800-QUIT-NOW). Try taking 1-2 tylenol twice a day for your chronic right sided pains.  If this isn't helpful after 2 months, call Dr. Christella Hartigan. Would try tramadol prescription. A copy of this information will be made available to Dr. Amador Cunas.

## 2011-06-29 ENCOUNTER — Ambulatory Visit (INDEPENDENT_AMBULATORY_CARE_PROVIDER_SITE_OTHER): Payer: Medicare Other | Admitting: Cardiovascular Disease

## 2011-06-29 ENCOUNTER — Encounter: Payer: Self-pay | Admitting: Cardiovascular Disease

## 2011-06-29 DIAGNOSIS — I10 Essential (primary) hypertension: Secondary | ICD-10-CM

## 2011-06-29 DIAGNOSIS — I2510 Atherosclerotic heart disease of native coronary artery without angina pectoris: Secondary | ICD-10-CM

## 2011-06-29 MED ORDER — LISINOPRIL 20 MG PO TABS: 20.0000 mg | ORAL_TABLET | Freq: Every day | ORAL | Status: DC

## 2011-06-29 NOTE — Progress Notes (Signed)
HPI:  The [name] returns for followup evaluation. She is a 70 year old woman with coronary artery disease status post PCI of a diagonal branch in 2008. She was treated with a bare-metal stent platform.  The patient underwent a followup nuclear study in 2010 demonstrated no significant ischemia with preserved left ventricular function. She's also been followed for mild carotid disease with her most recent carotid duplex last year demonstrating less than 40% bilateral stenosis. She is doing well at present from a cardiovascular standpoint. She's developed chronic abdominal pain thought to be related to adhesions. She denies exertional chest pain or pressure. She denies dyspnea, palpitations, or edema. She's had fleeting, nonexertional chest pains unchanged over time. She continues to smoke.  Outpatient Encounter Prescriptions as of 06/29/2011  Medication Sig Dispense Refill  . aspirin 81 MG tablet Take 81 mg by mouth daily.        Marland Kitchen ibuprofen (ADVIL,MOTRIN) 200 MG tablet Take 200 mg by mouth every 6 (six) hours as needed.        Marland Kitchen levothyroxine (SYNTHROID, LEVOTHROID) 125 MCG tablet Take 125 mcg by mouth daily.        Marland Kitchen lisinopril (PRINIVIL,ZESTRIL) 20 MG tablet Take 1 tablet (20 mg total) by mouth daily.  30 tablet  11  . metoprolol (TOPROL-XL) 50 MG 24 hr tablet Take 50 mg by mouth daily.        Marland Kitchen DISCONTD: lisinopril (PRINIVIL,ZESTRIL) 10 MG tablet Take 10 mg by mouth daily.          No Known Allergies  Past Medical History  Diagnosis Date  . Coronary atherosclerosis of unspecified type of vessel, native or graft   . Other and unspecified hyperlipidemia   . Unspecified essential hypertension   . Backache, unspecified   . Raynaud's syndrome   . Personal history of other diseases of digestive system   . Diverticulosis of colon (without mention of hemorrhage)   . Disorder of bone and cartilage, unspecified   . Anxiety state, unspecified   . Diverticulosis of colon (without mention of  hemorrhage)   . Hemorrhage of gastrointestinal tract, unspecified   . Unspecified hypothyroidism   . 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 Urinary tract infection, site not specified   . Urinary frequency   . Personal history of colonic polyps   . Hypothyroidism     ROS: Negative except as per HPI  BP 148/76  Pulse 71  Resp 16  Ht 5\' 6"  (1.676 m)  Wt 135 lb (61.236 kg)  BMI 21.79 kg/m2  PHYSICAL EXAM: Pt is alert and oriented, NAD HEENT: normal Neck: JVP - normal, carotids 2+= without bruits Lungs: CTA bilaterally CV: RRR without murmur or gallop Abd: soft, NT, Positive BS, no hepatomegaly Ext: no C/C/E, distal pulses intact and equal Skin: warm/dry no rash  EKG:  Normal sinus rhythm 68 beats per minute, left anterior fascicular block, cannot rule out age-indeterminate anterior infarction.  ASSESSMENT AND PLAN:

## 2011-06-29 NOTE — Patient Instructions (Signed)
Your physician wants you to follow-up in: 12 months with Dr. Excell Seltzer.  You will receive a reminder letter in the mail two months in advance. If you don't receive a letter, please call our office to schedule the follow-up appointment.  Your physician has recommended you make the following change in your medication: Increase Lisinopril to 20 mg by mouth daily.

## 2011-06-29 NOTE — Assessment & Plan Note (Signed)
The patient is stable without angina. We will continue her current medical program. See below for discussion of hypertension.

## 2011-06-29 NOTE — Assessment & Plan Note (Signed)
Blood pressure is mildly elevated. She checks her blood pressure at the pharmacy and it generally runs in the 140s over 70s. Recommend increase lisinopril to 20 mg daily.

## 2011-07-29 ENCOUNTER — Encounter (HOSPITAL_BASED_OUTPATIENT_CLINIC_OR_DEPARTMENT_OTHER): Payer: Self-pay | Admitting: *Deleted

## 2011-07-29 ENCOUNTER — Emergency Department (HOSPITAL_BASED_OUTPATIENT_CLINIC_OR_DEPARTMENT_OTHER)
Admission: EM | Admit: 2011-07-29 | Discharge: 2011-07-29 | Discharge status: Against Medical Advice | Payer: Medicare Other | Attending: Emergency Medicine | Admitting: Emergency Medicine

## 2011-07-29 DIAGNOSIS — M2540 Effusion, unspecified joint: Secondary | ICD-10-CM | POA: Insufficient documentation

## 2011-07-29 DIAGNOSIS — E039 Hypothyroidism, unspecified: Secondary | ICD-10-CM | POA: Insufficient documentation

## 2011-07-29 DIAGNOSIS — M2550 Pain in unspecified joint: Secondary | ICD-10-CM

## 2011-07-29 NOTE — ED Notes (Signed)
Pt and family member states they want to leave- wait explained- hard copy AMA form signed- contact info for director given to pt's family per request- pt alert and in no distress at time of departure- advised she is welcome to return at any time

## 2011-07-29 NOTE — ED Notes (Signed)
Pt reports swelling in wrist concerned that she may have a blood clot pt with uncomfortable sensation. Pt states that her arm feels "lifeless" pulses present  Cap refill WDL

## 2011-07-30 NOTE — ED Provider Notes (Signed)
History     CSN: [id number] Arrival date & time: 07/29/2011  8:33 PM  Chief Complaint  Patient presents with  . Joint Swelling   HPI   Past Medical History  Diagnosis Date  . Coronary atherosclerosis of unspecified type of vessel, native or graft   . Other and unspecified hyperlipidemia   . Unspecified essential hypertension   . Backache, unspecified   . Raynaud's syndrome   . Personal history of other diseases of digestive system   . Diverticulosis of colon (without mention of hemorrhage)   . Disorder of bone and cartilage, unspecified   . Anxiety state, unspecified   . Diverticulosis of colon (without mention of hemorrhage)   . Hemorrhage of gastrointestinal tract, unspecified   . Unspecified hypothyroidism   . 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 Urinary tract infection, site not specified   . Urinary frequency   . Personal history of colonic polyps   . Hypothyroidism     Past Surgical History  Procedure Date  . Coronary angioplasty with stent placement   . Gastrointestinal stomal tumor resection   . Cholecystectomy   . Tubal ligation   . Abdominal hysterectomy   . Appendectomy     Family History  Problem Relation Age of Onset  . Heart disease Mother     Arta Bruce  . Lung cancer Father     SISTER, UNCLE  . Cerebral aneurysm Sister     UNCLE NEPHEW    History  Substance Use Topics  . Smoking status: Current Everyday Smoker  . Smokeless tobacco: Never Used  . Alcohol Use: Not on file    OB History    Grav Para Term Preterm Abortions TAB SAB Ect Mult Living                  Review of Systems  Physical Exam  BP 130/78  Pulse 80  Temp(Src) 98.9 F (37.2 C) (Oral)  Resp 16  SpO2 95%  Physical Exam  ED Course  Procedures  MDM Patient left AMA prior to being seen by me, no exam or history performed      Olivia Mackie, MD 07/30/11 [phone]

## 2011-08-22 ENCOUNTER — Encounter: Payer: Self-pay | Admitting: Internal Medicine

## 2011-08-22 ENCOUNTER — Telehealth: Payer: Self-pay | Admitting: Internal Medicine

## 2011-08-22 ENCOUNTER — Ambulatory Visit (INDEPENDENT_AMBULATORY_CARE_PROVIDER_SITE_OTHER): Payer: Medicare Other | Admitting: Internal Medicine

## 2011-08-22 DIAGNOSIS — E039 Hypothyroidism, unspecified: Secondary | ICD-10-CM

## 2011-08-22 DIAGNOSIS — E785 Hyperlipidemia, unspecified: Secondary | ICD-10-CM

## 2011-08-22 DIAGNOSIS — I2510 Atherosclerotic heart disease of native coronary artery without angina pectoris: Secondary | ICD-10-CM

## 2011-08-22 DIAGNOSIS — I10 Essential (primary) hypertension: Secondary | ICD-10-CM

## 2011-08-22 DIAGNOSIS — Z0000 Encounter for general adult medical examination without abnormal findings: Secondary | ICD-10-CM

## 2011-08-22 LAB — LIPID PANEL
Cholesterol: 240 mg/dL — ABNORMAL HIGH (ref 0–200)
HDL: 46.6 mg/dL (ref >39.00)
Triglycerides: 213 mg/dL — ABNORMAL HIGH (ref 0.0–149.0)

## 2011-08-22 LAB — CBC WITH DIFFERENTIAL/PLATELET
Basophils Absolute: 0 10*3/uL (ref 0.0–0.1)
Hemoglobin: 14.4 g/dL (ref 12.0–15.0)
Lymphocytes Relative: 38.1 % (ref 12.0–46.0)
Monocytes Relative: 7.5 % (ref 3.0–12.0)
Neutro Abs: 3.8 10*3/uL (ref 1.4–7.7)
RDW: 14.2 % (ref 11.5–14.6)

## 2011-08-22 LAB — COMPREHENSIVE METABOLIC PANEL
Albumin: 4 g/dL (ref 3.5–5.2)
Alkaline Phosphatase: 100 U/L (ref 39–117)
BUN: 11 mg/dL (ref 6–23)
Glucose, Bld: 103 mg/dL — ABNORMAL HIGH (ref 70–99)
Potassium: 3.8 mEq/L (ref 3.5–5.1)
Total Bilirubin: 0.4 mg/dL (ref 0.3–1.2)

## 2011-08-22 LAB — VITAMIN B12: Vitamin B-12: 145 pg/mL — ABNORMAL LOW (ref 211–911)

## 2011-08-22 LAB — LDL CHOLESTEROL, DIRECT: Direct LDL: 160.6 mg/dL

## 2011-08-22 LAB — TSH: TSH: 0.06 u[IU]/mL — ABNORMAL LOW (ref 0.35–5.50)

## 2011-08-22 MED ORDER — METOPROLOL SUCCINATE ER 50 MG PO TB24: 50.0000 mg | ORAL_TABLET | Freq: Every day | ORAL | Status: DC

## 2011-08-22 MED ORDER — PRAVASTATIN SODIUM 40 MG PO TABS: 40.0000 mg | ORAL_TABLET | Freq: Every evening | ORAL | Status: DC

## 2011-08-22 MED ORDER — LEVOTHYROXINE SODIUM 125 MCG PO TABS: 125.0000 ug | ORAL_TABLET | Freq: Every day | ORAL | Status: DC

## 2011-08-22 MED ORDER — LISINOPRIL 20 MG PO TABS: 20.0000 mg | ORAL_TABLET | Freq: Every day | ORAL | Status: DC

## 2011-08-22 NOTE — Telephone Encounter (Signed)
Pt requesting results of labs to be mailed to her

## 2011-08-22 NOTE — Patient Instructions (Addendum)
Limit your sodium (Salt) intake  Smoking tobacco is very bad for your health. You should stop smoking immediately.    It is important that you exercise regularly, at least 20 minutes 3 to 4 times per week.  If you develop chest pain or shortness of breath seek  medical attention.  Return in 6 months for follow-up  Take a calcium supplement, plus [phone] units of vitamin D

## 2011-08-22 NOTE — Progress Notes (Signed)
Subjective:    Patient ID: [name], female    DOB: May 31, 1941, 70 y.o.   MRN: [id number]  HPI  70 year old patient who is seen today for a preventive health examination. She has coronary artery disease and carotid artery disease and is followed closely by cardiology. She denies any exertional chest pain or focal neurological symptoms. She has declined statin therapy in the past. Risk and benefit of therapy discussed at length today she continues to smoke one half pack of cigarettes daily. She has treated hypothyroidism. Family history is positive for B12 deficiency and her MCV one year ago was slightly elevated. 2 sisters have B12 deficiency She has colonic polyps and is followed by GI her last colonoscopy was 2 years ago. Last mammogram 2010  She declines flu vaccine tetanus and Pneumovax  1. Risk factors, based on past  M,S,F history-  patient has known coronary artery disease risk factors include hypertension dyslipidemia and ongoing tobacco use  2.  Physical activities: Fairly sedentary no regular exercise program  3.  Depression/mood: No history of depression or mood disorder  4.  Hearing: No deficits;  is plagued by cerumen impactions due to narrow canals 5.  ADL's: Independent in all aspects of daily living  6.  Fall risk: Low  7.  Home safety: No problems identified  8.  Height weight, and visual acuity;  9.  Counseling: Total smoking cessation encouraged as well as statin therapy 10. Lab orders based on risk factors: Chemistry profile will be reviewed this will include a B12 level and vitamin D level 11. Referral : Mammogram and GI and cardiology followup recommended  12. Care plan: Mammogram. The patient is agreeable to initiating statin therapy 13. Cognitive assessment: Alert and oriented with normal affect       Review of Systems  Constitutional: Negative for fever, appetite change, fatigue and unexpected weight change.  HENT: Negative for hearing loss, ear  pain, nosebleeds, congestion, sore throat, mouth sores, trouble swallowing, neck stiffness, dental problem, voice change, sinus pressure and tinnitus.   Eyes: Negative for photophobia, pain, redness and visual disturbance.  Respiratory: Negative for cough, chest tightness and shortness of breath.   Cardiovascular: Negative for chest pain, palpitations and leg swelling.  Gastrointestinal: Negative for nausea, vomiting, abdominal pain, diarrhea, constipation, blood in stool, abdominal distention and rectal pain.  Genitourinary: Negative for dysuria, urgency, frequency, hematuria, flank pain, vaginal bleeding, vaginal discharge, difficulty urinating, genital sores, vaginal pain, menstrual problem and pelvic pain.  Musculoskeletal: Negative for back pain and arthralgias.  Skin: Negative for rash.  Neurological: Negative for dizziness, syncope, speech difficulty, weakness, light-headedness, numbness and headaches.  Hematological: Negative for adenopathy. Does not bruise/bleed easily.  Psychiatric/Behavioral: Negative for suicidal ideas, behavioral problems, self-injury, dysphoric mood and agitation. The patient is not nervous/anxious.        Objective:   Physical Exam  Constitutional: She is oriented to person, place, and time. She appears well-developed and well-nourished.  HENT:  Head: Normocephalic and atraumatic.  Right Ear: External ear normal.  Left Ear: External ear normal.  Mouth/Throat: Oropharynx is clear and moist.  Eyes: Conjunctivae and EOM are normal.  Neck: Normal range of motion. Neck supple. No JVD present. No thyromegaly present.  Cardiovascular: Normal rate, regular rhythm, normal heart sounds and intact distal pulses.   No murmur heard.      Absent right posterior tibial pulse  Pulmonary/Chest: Effort normal and breath sounds normal. She has no wheezes. She has no rales.  Abdominal: Soft.  Bowel sounds are normal. She exhibits no distension and no mass. There is no  tenderness. There is no rebound and no guarding.  Musculoskeletal: Normal range of motion. She exhibits no edema and no tenderness.  Neurological: She is alert and oriented to person, place, and time. She has normal reflexes. No cranial nerve deficit. She exhibits normal muscle tone. Coordination normal.  Skin: Skin is warm and dry. No rash noted.  Psychiatric: She has a normal mood and affect. Her behavior is normal.          Assessment & Plan:   Preventive health exam Dyslipidemia. We'll start pravastatin 40 mg daily Hypertension well controlled Coronary artery disease clinically stable. Follow cardiology  Recheck here in 6 months

## 2011-08-23 ENCOUNTER — Other Ambulatory Visit: Payer: Self-pay | Admitting: Internal Medicine

## 2011-08-23 LAB — VITAMIN D 25 HYDROXY (VIT D DEFICIENCY, FRACTURES): Vit D, 25-Hydroxy: 26 ng/mL — ABNORMAL LOW (ref 30–89)

## 2011-08-23 MED ORDER — ERGOCALCIFEROL 1.25 MG (50000 UT) PO CAPS: 50000.0000 [IU] | ORAL_CAPSULE | ORAL | Status: DC

## 2011-08-23 MED ORDER — LEVOTHYROXINE SODIUM 112 MCG PO TABS: 112.0000 ug | ORAL_TABLET | Freq: Every day | ORAL | Status: DC

## 2011-08-23 NOTE — Telephone Encounter (Signed)
Spoke with son , gave changes to meds due to labs, informed needs tocall tobe placed on injection schedule for B12 qmon. New rx's sent to pharmacy

## 2011-08-23 NOTE — Telephone Encounter (Signed)
Please advise before I call and mail

## 2011-08-23 NOTE — Telephone Encounter (Signed)
OK to mail

## 2011-08-23 NOTE — Progress Notes (Signed)
Quick Note:  Spoke with son- informed of changes and new meds. Will need to call and be put on injection schdule qmos for B12. KIK ______

## 2012-01-04 DIAGNOSIS — H6120 Impacted cerumen, unspecified ear: Secondary | ICD-10-CM | POA: Diagnosis not present

## 2012-01-04 DIAGNOSIS — H61309 Acquired stenosis of external ear canal, unspecified, unspecified ear: Secondary | ICD-10-CM | POA: Diagnosis not present

## 2012-03-27 ENCOUNTER — Other Ambulatory Visit: Payer: Self-pay

## 2012-03-27 ENCOUNTER — Telehealth: Payer: Self-pay

## 2012-03-27 MED ORDER — ERGOCALCIFEROL 1.25 MG (50000 UT) PO CAPS: 50000.0000 [IU] | ORAL_CAPSULE | ORAL | Status: DC

## 2012-03-27 NOTE — Telephone Encounter (Signed)
Fax refill request from [facility] for Vit D 50,000 Last seen 08/15/11 - this was start past lab drawn - please advise refill okay?

## 2012-03-27 NOTE — Telephone Encounter (Signed)
12  

## 2012-03-27 NOTE — Telephone Encounter (Signed)
one

## 2012-03-27 NOTE — Telephone Encounter (Signed)
Error

## 2012-03-31 ENCOUNTER — Telehealth: Payer: Self-pay | Admitting: Cardiovascular Disease

## 2012-03-31 DIAGNOSIS — I10 Essential (primary) hypertension: Secondary | ICD-10-CM

## 2012-03-31 DIAGNOSIS — I2510 Atherosclerotic heart disease of native coronary artery without angina pectoris: Secondary | ICD-10-CM

## 2012-03-31 NOTE — Telephone Encounter (Signed)
New msg Pt has been having cramps in her legs. Pt wants to talk to you about this. She said maybe she needs to start taking potassium.

## 2012-03-31 NOTE — Telephone Encounter (Signed)
Patient called,stating she has been having cramping in left lower leg at night.States Dr.Cooper gave her potassium before and wanted to know if she can start taking again.Fowarded to Dr.Cooper for advice.Call patient back at [phone].

## 2012-04-02 MED ORDER — POTASSIUM CHLORIDE CRYS ER 10 MEQ PO TBCR: 10.0000 meq | EXTENDED_RELEASE_TABLET | Freq: Every day | ORAL | Status: DC

## 2012-04-02 NOTE — Telephone Encounter (Signed)
Patient called was told Dr.Cooper prescribed K-Dur 10 meq daily.Also needs to have bmet,magnesium.Patient stated she is in [place] helping with sick family member and will come to office when she comes back home 04/11/12.

## 2012-04-02 NOTE — Telephone Encounter (Signed)
Can take K-Dur 10 meq daily to see if this helps. Would also check a BMET and Mg level to make sure baseline electrolytes are ok.

## 2012-04-11 ENCOUNTER — Other Ambulatory Visit: Payer: Medicare Other

## 2012-04-14 ENCOUNTER — Telehealth: Payer: Self-pay | Admitting: Internal Medicine

## 2012-04-14 ENCOUNTER — Other Ambulatory Visit (INDEPENDENT_AMBULATORY_CARE_PROVIDER_SITE_OTHER): Payer: Medicare Other

## 2012-04-14 DIAGNOSIS — I2510 Atherosclerotic heart disease of native coronary artery without angina pectoris: Secondary | ICD-10-CM

## 2012-04-14 DIAGNOSIS — I10 Essential (primary) hypertension: Secondary | ICD-10-CM

## 2012-04-14 LAB — BASIC METABOLIC PANEL
Chloride: 100 mEq/L (ref 96–112)
Potassium: 3.4 mEq/L — ABNORMAL LOW (ref 3.5–5.1)

## 2012-04-14 LAB — MAGNESIUM: Magnesium: 1.8 mg/dL (ref 1.5–2.5)

## 2012-04-14 NOTE — Telephone Encounter (Signed)
Pt rtn call to deborah lefler

## 2012-04-14 NOTE — Telephone Encounter (Signed)
Mrs Ottey is out of town taking care of her ill family members according to her husband.  I stressed the importance of her increasing her potassium now instead of waiting until she returns to town in a week or so.  He agreed to try to get a hold of her to let her know.

## 2012-04-16 MED ORDER — POTASSIUM CHLORIDE CRYS ER 20 MEQ PO TBCR: 20.0000 meq | EXTENDED_RELEASE_TABLET | Freq: Every day | ORAL | Status: DC

## 2012-04-16 NOTE — Telephone Encounter (Signed)
I spoke with the pt's husband and he did get in touch with the pt and made her aware that she needs to increase her potassium to daily and have a BMP rechecked in 2 WEEKS. He will have the pt call our office in 2 WEEKS to schedule lab work. Order has been placed for BMP and Rx sent to pharmacy for new dosage of potassium.

## 2012-04-17 ENCOUNTER — Telehealth: Payer: Self-pay | Admitting: Cardiovascular Disease

## 2012-04-17 NOTE — Telephone Encounter (Signed)
Pt's husband calling back questioning if lab results were life threatening--advised that ms Malenfant was only 1/10 of a point below normal and altho we like to keep an eye on K+ levels, she is in no danger--husband agrees--nt

## 2012-04-17 NOTE — Telephone Encounter (Signed)
Unable to leave message,  phone rings no answer service.

## 2012-04-17 NOTE — Telephone Encounter (Signed)
Fu msg Pt returning your call 

## 2012-04-29 ENCOUNTER — Encounter: Payer: Self-pay | Admitting: Physician Assistant

## 2012-04-29 ENCOUNTER — Ambulatory Visit (INDEPENDENT_AMBULATORY_CARE_PROVIDER_SITE_OTHER): Payer: Medicare Other | Admitting: Physician Assistant

## 2012-04-29 ENCOUNTER — Other Ambulatory Visit (INDEPENDENT_AMBULATORY_CARE_PROVIDER_SITE_OTHER): Payer: Medicare Other

## 2012-04-29 ENCOUNTER — Telehealth: Payer: Self-pay | Admitting: Cardiovascular Disease

## 2012-04-29 ENCOUNTER — Encounter: Payer: Self-pay | Admitting: *Deleted

## 2012-04-29 VITALS — BP 108/76 | HR 65 | Ht 65.5 in | Wt 126.1 lb

## 2012-04-29 DIAGNOSIS — R079 Chest pain, unspecified: Secondary | ICD-10-CM

## 2012-04-29 DIAGNOSIS — I2510 Atherosclerotic heart disease of native coronary artery without angina pectoris: Secondary | ICD-10-CM

## 2012-04-29 DIAGNOSIS — I10 Essential (primary) hypertension: Secondary | ICD-10-CM | POA: Diagnosis not present

## 2012-04-29 MED ORDER — NITROGLYCERIN 0.4 MG SL SUBL: 0.4000 mg | SUBLINGUAL_TABLET | SUBLINGUAL | Status: DC | PRN

## 2012-04-29 NOTE — Telephone Encounter (Signed)
I spoke with the pt and she has been traveling back and forth to [place] to help care for his sister-in-law and brother.  The pt was in [place] last week and on Thursday the pt went to the grocery store and developed chest pain, the pt got back home and her BP was 184/101 and EMS was called.  EMS did an EKG and it was okay and her BP had improved.  The chest pain resolved on it's own and the pt did not go to the hospital.  The pt has been under a lot of stress caring for her family.  The pt continues to have chest soreness and discomfort that comes and goes and would like to be evaluated today if possible.  I have scheduled the pt to come into the office today to see Tereso Newcomer PA-C.

## 2012-04-29 NOTE — Patient Instructions (Signed)
Your physician has requested that you have a LEFT cardiac catheterization THIS WILL BE DONE 04/30/12 WITH DR. Clifton James @ 7:30 AM. Cardiac catheterization is used to diagnose and/or treat various heart conditions. Doctors may recommend this procedure for a number of different reasons. The most common reason is to evaluate chest pain. Chest pain can be a symptom of coronary artery disease (CAD), and cardiac catheterization can show whether plaque is narrowing or blocking your heart's arteries. This procedure is also used to evaluate the valves, as well as measure the blood flow and oxygen levels in different parts of your heart. For further information please visit [email or web address] Please follow instruction sheet, as given.  STAT PRE CATH LABS TODAY, BMET, CBC W/DIFF, PT/INR  A PRESCRIPTION FOR NITROGLYCERIN HAS BEEN SENT IN TODAY FOR YOU TO [facility]

## 2012-04-29 NOTE — Progress Notes (Signed)
[address] [place], [place]  [address] Phone: [phone] Fax:  [phone]  Date:  04/29/2012   Name:  [name]   DOB:  [birth year]   MRN:  [id number]  PCP:  Rogelia Boga, MD, MD  Primary Cardiologist:  Dr. Tonny Bollman  Primary Electrophysiologist:  None    History of Present Illness: [name] is a 71 y.o. female who returns for evaluation of chest pain.  She has a history of CAD, status post NSTEMI 03/2007.  LHC 03/2007: Mid LAD 80%-90%, proximal OM1 30-40%, proximal, mid and distal RCA 25-40%, EF 70%.  PCI: 2.25 x 16 mm Express BMS to the mid LAD.  Last nuclear study 04/2009: EF 69%, low risk with small prior anterior infarct versus soft tissue attenuation, no ischemia.  Carotid Dopplers 07/2010:0-39% bilateral ICA stenosis.  She last saw Dr. Excell Seltzer 06/2011.  Lisinopril was increased at that visit due to high blood pressure.  Plan was to follow up in one year.  She called in today with complaints of chest discomfort.  She has been traveling back and forth to [place] to care for an ailing sister-in-law and brother.  She apparently developed some chest discomfort with a high blood pressure of 184/101 while in [place] this past week.  She contacted EMS but did not go to the hospital.  Her chest discomfort last week was similar to her pain she had with her myocardial infarction.  It lasted about 5-10 minutes.  She took 4 baby aspirin.  This was associated with dyspnea and diaphoresis.  Since that time, she notes chest tightness with activity.  It resolves with rest.  He she also notes dyspnea with exertion which is unusual for her.  She denies orthopnea, PND or edema.  She denies syncope or near-syncope.  Wt Readings from Last 3 Encounters:  08/22/11 135 lb (61.236 kg)  06/29/11 135 lb (61.236 kg)  06/27/11 135 lb 12.8 oz (61.598 kg)     Potassium  Date/Time Value Range Status  04/14/2012 12:21 PM 3.4* 3.5-5.1 (mEq/L) Final       Creatinine, Ser  Date/Time Value Range Status  04/14/2012 12:21 PM 0.9  0.4-1.2 (mg/dL) Final     ALT  Date/Time Value Range Status  08/22/2011 10:34 AM 10  0-35 (U/L) Final     TSH  Date/Time Value Range Status  08/22/2011 10:34 AM 0.06* 0.35-5.50 (uIU/mL) Final     Hemoglobin  Date/Time Value Range Status  08/22/2011 10:34 AM 14.4  12.0-15.0 (g/dL) Final    Past Medical History  Diagnosis Date  . Coronary atherosclerosis of unspecified type of vessel, native or graft   . Other and unspecified hyperlipidemia   . Unspecified essential hypertension   . Backache, unspecified   . Raynaud's syndrome   . Personal history of other diseases of digestive system   . Diverticulosis of colon (without mention of hemorrhage)   . Disorder of bone and cartilage, unspecified   . Anxiety state, unspecified   . Diverticulosis of colon (without mention of hemorrhage)   . Hemorrhage of gastrointestinal tract, unspecified   . Unspecified hypothyroidism   . 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 Urinary tract infection, site not specified   . Urinary frequency   . Personal history of colonic polyps   . Hypothyroidism     Current Outpatient Prescriptions  Medication Sig Dispense Refill  . aspirin 81 MG tablet Take 81 mg by  mouth daily.        . ergocalciferol (VITAMIN D2) 50000 UNITS capsule Take 1 capsule (50,000 Units total) by mouth once a week.  12 capsule  0  . ibuprofen (ADVIL,MOTRIN) 200 MG tablet Take 200 mg by mouth every 6 (six) hours as needed. pain      . levothyroxine (SYNTHROID) 112 MCG tablet Take 1 tablet (112 mcg total) by mouth daily.  90 tablet  3  . lisinopril (PRINIVIL,ZESTRIL) 20 MG tablet Take 1 tablet (20 mg total) by mouth daily.  30 tablet  11  . metoprolol (TOPROL-XL) 50 MG 24 hr tablet Take 1 tablet (50 mg total) by mouth daily.  90 tablet  6  . potassium chloride (K-DUR,KLOR-CON) 20 MEQ tablet Take 1 tablet (20 mEq total) by mouth  daily.  30 tablet  6  . pravastatin (PRAVACHOL) 40 MG tablet Take 1 tablet (40 mg total) by mouth every evening.  90 tablet  11    Allergies: No Known Allergies  History  Substance Use Topics  . Smoking status: Current Everyday Smoker  . Smokeless tobacco: Never Used  . Alcohol Use: No     Family History  Problem Relation Age of Onset  . Heart disease Mother     Arta Bruce  . Lung cancer Father     SISTER, UNCLE  . Cerebral aneurysm Sister     UNCLE NEPHEW     ROS:  Please see the history of present illness.   She has occasional vertigo.  All other systems reviewed and negative.   PHYSICAL EXAM: VS:  BP 108/76  Pulse 65  Ht 5' 5.5" (1.664 m)  Wt 126 lb 1.9 oz (57.208 kg)  BMI 20.67 kg/m2 Well nourished, well developed, in no acute distress HEENT: normal Neck: no JVD Endocrine: No thyromegaly Cardiac:  normal S1, S2; RRR; no murmur Lungs:  clear to auscultation bilaterally, no wheezing, rhonchi or rales Abd: soft, nontender, no hepatomegaly Ext: no edema Skin: warm and dry Neuro:  CNs 2-12 intact, no focal abnormalities noted Psych: Normal affect  EKG:  Sinus rhythm, heart rate 65, left axis deviation, Nonspecific ST-T wave changes, no change from prior tracing   ASSESSMENT AND PLAN:  1.  Chest Pain I'm concerned that she is describing angina. She continues to smoke. She does not take a statin. ECG is stable. I have recommended proceeding with cardiac catheterization.  I discussed this with Dr. Antoine Poche (DOD) who agreed. The patient is eager to get back to [place] this week to care for her sister-in-law. We will arrange for her to have a heart catheterization tomorrow. I will give her a prescription for p.r.n. Nitroglycerin.  She knows to go the emergency room if she has recurrent symptoms. Risks and benefits of cardiac catheterization have been discussed with the patient.  These include bleeding, infection, kidney damage, stroke, heart attack,  death.  The patient understands these risks and is willing to proceed.   2.  Coronary Artery Disease Continue aspirin.  Proceed with cardiac catheterization as noted.  3.  Hypertension Controlled.  Continue current therapy.   4.  Hyperlipidemia She does not take a statin because she states she is "afraid of it."    Signed, Tereso Newcomer, PA-C  2:24 PM 04/29/2012

## 2012-04-29 NOTE — H&P (Signed)
History and Physical   Date:  04/29/2012   Name:  [name]   DOB:  [birth year]   MRN:  [id number]  PCP:  Rogelia Boga, MD, MD  Primary Cardiologist:  Dr. Tonny Bollman  Primary Electrophysiologist:  None    History of Present Illness: [name] is a 71 y.o. female who returns for evaluation of chest pain.  She has a history of CAD, status post NSTEMI 03/2007.  LHC 03/2007: Mid LAD 80%-90%, proximal OM1 30-40%, proximal, mid and distal RCA 25-40%, EF 70%.  PCI: 2.25 x 16 mm Express BMS to the mid LAD.  Last nuclear study 04/2009: EF 69%, low risk with small prior anterior infarct versus soft tissue attenuation, no ischemia.  Carotid Dopplers 07/2010:0-39% bilateral ICA stenosis.  She last saw Dr. Excell Seltzer 06/2011.  Lisinopril was increased at that visit due to high blood pressure.  Plan was to follow up in one year.  She called in today with complaints of chest discomfort.  She has been traveling back and forth to [place] to care for an ailing sister-in-law and brother.  She apparently developed some chest discomfort with a high blood pressure of 184/101 while in [place] this past week.  She contacted EMS but did not go to the hospital.  Her chest discomfort last week was similar to her pain she had with her myocardial infarction.  It lasted about 5-10 minutes.  She took 4 baby aspirin.  This was associated with dyspnea and diaphoresis.  Since that time, she notes chest tightness with activity.  It resolves with rest.  He she also notes dyspnea with exertion which is unusual for her.  She denies orthopnea, PND or edema.  She denies syncope or near-syncope.  Wt Readings from Last 3 Encounters:  08/22/11 135 lb (61.236 kg)  06/29/11 135 lb (61.236 kg)  06/27/11 135 lb 12.8 oz (61.598 kg)     Potassium  Date/Time Value Range Status  04/14/2012 12:21 PM 3.4* 3.5-5.1 (mEq/L) Final     Creatinine, Ser  Date/Time Value Range Status  04/14/2012 12:21 PM 0.9  0.4-1.2 (mg/dL)  Final     ALT  Date/Time Value Range Status  08/22/2011 10:34 AM 10  0-35 (U/L) Final     TSH  Date/Time Value Range Status  08/22/2011 10:34 AM 0.06* 0.35-5.50 (uIU/mL) Final     Hemoglobin  Date/Time Value Range Status  08/22/2011 10:34 AM 14.4  12.0-15.0 (g/dL) Final    Past Medical History  Diagnosis Date  . Coronary atherosclerosis of unspecified type of vessel, native or graft   . Other and unspecified hyperlipidemia   . Unspecified essential hypertension   . Backache, unspecified   . Raynaud's syndrome   . Personal history of other diseases of digestive system   . Diverticulosis of colon (without mention of hemorrhage)   . Disorder of bone and cartilage, unspecified   . Anxiety state, unspecified   . Diverticulosis of colon (without mention of hemorrhage)   . Hemorrhage of gastrointestinal tract, unspecified   . Unspecified hypothyroidism   . 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 Urinary tract infection, site not specified   . Urinary frequency   . Personal history of colonic polyps   . Hypothyroidism     Current Outpatient Prescriptions  Medication Sig Dispense Refill  . aspirin 81 MG tablet Take 81 mg by mouth daily.        . ergocalciferol (VITAMIN D2) 50000  UNITS capsule Take 1 capsule (50,000 Units total) by mouth once a week.  12 capsule  0  . ibuprofen (ADVIL,MOTRIN) 200 MG tablet Take 200 mg by mouth every 6 (six) hours as needed. pain      . levothyroxine (SYNTHROID) 112 MCG tablet Take 1 tablet (112 mcg total) by mouth daily.  90 tablet  3  . lisinopril (PRINIVIL,ZESTRIL) 20 MG tablet Take 1 tablet (20 mg total) by mouth daily.  30 tablet  11  . metoprolol (TOPROL-XL) 50 MG 24 hr tablet Take 1 tablet (50 mg total) by mouth daily.  90 tablet  6  . potassium chloride (K-DUR,KLOR-CON) 20 MEQ tablet Take 1 tablet (20 mEq total) by mouth daily.  30 tablet  6  . pravastatin (PRAVACHOL) 40 MG tablet Take 1 tablet (40 mg total) by  mouth every evening.  90 tablet  11    Allergies: No Known Allergies  History  Substance Use Topics  . Smoking status: Current Everyday Smoker  . Smokeless tobacco: Never Used  . Alcohol Use: No     Family History  Problem Relation Age of Onset  . Heart disease Mother     Arta Bruce  . Lung cancer Father     SISTER, UNCLE  . Cerebral aneurysm Sister     UNCLE NEPHEW     ROS:  Please see the history of present illness.   She has occasional vertigo.  All other systems reviewed and negative.   PHYSICAL EXAM: VS:  BP 108/76  Pulse 65  Ht 5' 5.5" (1.664 m)  Wt 126 lb 1.9 oz (57.208 kg)  BMI 20.67 kg/m2 Well nourished, well developed, in no acute distress HEENT: normal Neck: no JVD Endocrine: No thyromegaly Cardiac:  normal S1, S2; RRR; no murmur Lungs:  clear to auscultation bilaterally, no wheezing, rhonchi or rales Abd: soft, nontender, no hepatomegaly Ext: no edema Skin: warm and dry Neuro:  CNs 2-12 intact, no focal abnormalities noted Psych: Normal affect  EKG:  Sinus rhythm, heart rate 65, left axis deviation, Nonspecific ST-T wave changes, no change from prior tracing   ASSESSMENT AND PLAN:  1.  Chest Pain I'm concerned that she is describing angina. She continues to smoke. She does not take a statin. ECG is stable. I have recommended proceeding with cardiac catheterization.  I discussed this with Dr. Antoine Poche (DOD) who agreed. The patient is eager to get back to [place] this week to care for her sister-in-law. We will arrange for her to have a heart catheterization tomorrow. I will give her a prescription for p.r.n. Nitroglycerin.  She knows to go the emergency room if she has recurrent symptoms. Risks and benefits of cardiac catheterization have been discussed with the patient.  These include bleeding, infection, kidney damage, stroke, heart attack, death.  The patient understands these risks and is willing to proceed.   2.  Coronary Artery  Disease Continue aspirin.  Proceed with cardiac catheterization as noted.  3.  Hypertension Controlled.  Continue current therapy.   4.  Hyperlipidemia She does not take a statin because she states she is "afraid of it."    Signed, Tereso Newcomer, PA-C  2:24 PM 04/29/2012

## 2012-04-29 NOTE — Telephone Encounter (Signed)
Pt has had some chest pain, has ekg done 5-23 it was normal, now chest feels tight, some sob , pls advise [phone]

## 2012-04-30 ENCOUNTER — Inpatient Hospital Stay (HOSPITAL_BASED_OUTPATIENT_CLINIC_OR_DEPARTMENT_OTHER)
Admission: RE | Admit: 2012-04-30 | Discharge: 2012-04-30 | Disposition: A | Discharge status: Home / Self Care | Payer: Medicare Other | Source: Ambulatory Visit | Attending: Cardiovascular Disease | Admitting: Cardiovascular Disease

## 2012-04-30 ENCOUNTER — Encounter (HOSPITAL_BASED_OUTPATIENT_CLINIC_OR_DEPARTMENT_OTHER)
Admission: RE | Disposition: A | Discharge status: Home / Self Care | Payer: Self-pay | Source: Ambulatory Visit | Attending: Cardiovascular Disease

## 2012-04-30 DIAGNOSIS — R079 Chest pain, unspecified: Secondary | ICD-10-CM | POA: Insufficient documentation

## 2012-04-30 DIAGNOSIS — E785 Hyperlipidemia, unspecified: Secondary | ICD-10-CM | POA: Insufficient documentation

## 2012-04-30 DIAGNOSIS — I2510 Atherosclerotic heart disease of native coronary artery without angina pectoris: Secondary | ICD-10-CM | POA: Insufficient documentation

## 2012-04-30 DIAGNOSIS — I252 Old myocardial infarction: Secondary | ICD-10-CM | POA: Insufficient documentation

## 2012-04-30 DIAGNOSIS — Z9861 Coronary angioplasty status: Secondary | ICD-10-CM | POA: Insufficient documentation

## 2012-04-30 DIAGNOSIS — I10 Essential (primary) hypertension: Secondary | ICD-10-CM | POA: Insufficient documentation

## 2012-04-30 LAB — BASIC METABOLIC PANEL
BUN: 15 mg/dL (ref 6–23)
BUN: 12 mg/dL (ref 6–23)
CO2: 30 mEq/L (ref 19–32)
CO2: 27 mEq/L (ref 19–32)
Chloride: 103 mEq/L (ref 96–112)
Chloride: 105 mEq/L (ref 96–112)
Creatinine, Ser: 0.81 mg/dL (ref 0.50–1.10)
Potassium: 4.7 mEq/L (ref 3.5–5.1)

## 2012-04-30 LAB — CBC
HCT: 42.3 % (ref 36.0–46.0)
Hemoglobin: 14.5 g/dL (ref 12.0–15.0)
MCH: 32.3 pg (ref 26.0–34.0)
MCHC: 34.3 g/dL (ref 30.0–36.0)
MCV: 94.2 fL (ref 78.0–100.0)

## 2012-04-30 SURGERY — JV LEFT HEART CATHETERIZATION WITH CORONARY ANGIOGRAM
Anesthesia: Moderate Sedation

## 2012-04-30 MED ORDER — SODIUM CHLORIDE 0.9 % IJ SOLN: 3.0000 mL | Freq: Two times a day (BID) | INTRAMUSCULAR | Status: DC

## 2012-04-30 MED ORDER — SODIUM CHLORIDE 0.9 % IV SOLN: 250.0000 mL | INTRAVENOUS | Status: DC | PRN

## 2012-04-30 MED ORDER — SODIUM CHLORIDE 0.9 % IV SOLN: INTRAVENOUS | Status: AC

## 2012-04-30 MED ORDER — DIAZEPAM 5 MG PO TABS
5.0000 mg | ORAL_TABLET | Freq: Once | ORAL | Status: AC
  Administered 2012-04-30: 5 mg via ORAL

## 2012-04-30 MED ORDER — ASPIRIN 81 MG PO CHEW
324.0000 mg | CHEWABLE_TABLET | ORAL | Status: AC
  Administered 2012-04-30: 324 mg via ORAL

## 2012-04-30 MED ORDER — ACETAMINOPHEN 325 MG PO TABS: 650.0000 mg | ORAL_TABLET | ORAL | Status: DC | PRN

## 2012-04-30 MED ORDER — SODIUM CHLORIDE 0.9 % IJ SOLN: 3.0000 mL | INTRAMUSCULAR | Status: DC | PRN

## 2012-04-30 MED ORDER — SODIUM CHLORIDE 0.9 % IV SOLN: INTRAVENOUS | Status: DC

## 2012-04-30 NOTE — Interval H&P Note (Signed)
History and Physical Interval Note:  04/30/2012 7:20 AM  [name]  has presented today for surgery, with the diagnosis of chest pain  The various methods of treatment have been discussed with the patient and family. After consideration of risks, benefits and other options for treatment, the patient has consented to  Procedure(s) (LRB): JV LEFT HEART CATHETERIZATION WITH CORONARY ANGIOGRAM (N/A) as a surgical intervention .  The patients' history has been reviewed, patient examined, no change in status, stable for surgery.  I have reviewed the patients' chart and labs.  Questions were answered to the patient's satisfaction.     [name]

## 2012-04-30 NOTE — OR Nursing (Signed)
Tegaderm dressing applied, site level 0, bedrest begins at 0900 

## 2012-04-30 NOTE — OR Nursing (Signed)
Discharge instructions reviewed and signed, pt stated understanding, ambulated in hall without difficulty, site level 0, transported to son's car via wheelchair 

## 2012-04-30 NOTE — CV Procedure (Signed)
   Cardiac Catheterization Operative Report  [name] [id number] 5/29/20138:46 AM Rogelia Boga, MD, MD  Procedure Performed:  1. Left Heart Catheterization 2. Selective Coronary Angiography 3. Left ventricular angiogram  Operator: Verne Carrow, MD  Indication: Chest pain, known CAD with placement stent LAD in 2008.                                      Procedure Details: The risks, benefits, complications, treatment options, and expected outcomes were discussed with the patient. The patient and/or family concurred with the proposed plan, giving informed consent. The patient was brought to the cath lab after IV hydration was begun and oral premedication was given. The patient was further sedated with Versed and Fentanyl. The right groin was prepped and draped in the usual manner. Using the modified Seldinger access technique, a 4 French sheath was placed in the right femoral artery. Standard diagnostic catheters were used to perform selective coronary angiography. A pigtail catheter was used to perform a left ventricular angiogram.  There were no immediate complications. The patient was taken to the recovery area in stable condition.   Hemodynamic Findings: Central aortic pressure: 101/53 Left ventricular pressure: 118/10/15  Angiographic Findings:  Left main: 30% mid stenosis with mild calcification. This vessel bifurcates into the LAD and Circumflex.  Left Anterior Descending Artery: Moderate sized vessel that courses to the apex and gives off a small caliber diagonal branch. The proximal LAD has diffuse 30% calcified stenosis. The mid vessel has a patent stent with 25% restenosis in the proximal segment of the stent. The distal LAD has diffuse non-obstructive plaque. The diagonal is a small caliber vessel with 90% mid stenosis. The diagonal is a 1.0-1.5 mm vessel.   Circumflex Artery:Small caliber system with early OM branch. The OM branch is 1.6-1.7 mm in  diameter and has an ostial 60-70% stenosis. The proximal Circumflex is a small caliber vessel with 60% stenosis. The remainder of the AV groove Circumflex appears to be diffusely diseased with no focal stenosis.   Right Coronary Artery: Large, dominant vessel with serial 30% stenoses in the proximal vessel. The mid vessel has a 40% stenosis. The PDA and PL branch are patent with mild plaque disease.   Left Ventricular Angiogram: LVEF 65-70%.   Impression: 1. Triple vessel CAD with patent stent mid LAD, diffuse non flow limiting disease in the RCA, diffuse disease in the small caliber Circumflex system that is too small for PCI.  2. Preserved LV systolic function   Recommendations: Continue medical management.        Complications:  None. The patient tolerated the procedure well.

## 2012-04-30 NOTE — OR Nursing (Signed)
Dr McAlhany at bedside to discuss results and treatment plan with pt and family 

## 2012-04-30 NOTE — Discharge Instructions (Signed)
Follow up Dr. Excell Seltzer 3 weeks.

## 2012-05-01 ENCOUNTER — Telehealth: Payer: Self-pay | Admitting: Cardiovascular Disease

## 2012-05-01 NOTE — Telephone Encounter (Signed)
New msg Pt was calling about some blood work results

## 2012-05-01 NOTE — Telephone Encounter (Signed)
I spoke with the pt and made her aware of recent BMP results.

## 2012-05-14 ENCOUNTER — Encounter: Payer: Medicare Other | Admitting: Cardiovascular Disease

## 2012-07-18 ENCOUNTER — Encounter: Payer: Self-pay | Admitting: Gastroenterology

## 2012-07-18 ENCOUNTER — Other Ambulatory Visit: Payer: Self-pay | Admitting: Internal Medicine

## 2012-07-18 NOTE — Telephone Encounter (Signed)
Please advise - you last saw 08/2011  Last written 03/2012

## 2012-07-20 NOTE — Telephone Encounter (Signed)
Take a calcium supplement, plus 800-1200 units of vitamin D 

## 2012-07-23 ENCOUNTER — Encounter: Payer: Medicare Other | Admitting: Cardiovascular Disease

## 2012-08-14 ENCOUNTER — Ambulatory Visit (INDEPENDENT_AMBULATORY_CARE_PROVIDER_SITE_OTHER): Payer: Medicare Other | Admitting: Cardiovascular Disease

## 2012-08-14 ENCOUNTER — Encounter: Payer: Self-pay | Admitting: Cardiovascular Disease

## 2012-08-14 VITALS — BP 138/89 | HR 64 | Ht 66.0 in | Wt 128.0 lb

## 2012-08-14 DIAGNOSIS — I2510 Atherosclerotic heart disease of native coronary artery without angina pectoris: Secondary | ICD-10-CM

## 2012-08-14 NOTE — Patient Instructions (Signed)
Your physician wants you to follow-up in: 1 YEAR with Dr Cooper.  You will receive a reminder letter in the mail two months in advance. If you don't receive a letter, please call our office to schedule the follow-up appointment.  Your physician recommends that you continue on your current medications as directed. Please refer to the Current Medication list given to you today.  

## 2012-08-14 NOTE — Progress Notes (Addendum)
HPI:  71 year old [name] presenting for followup evaluation. The patient has coronary artery disease. She initially presented with non-ST elevation infarction in 2008 and was treated with bare-metal stenting of the mid LAD. She was seen this summer with recurrent chest pain and underwent cardiac catheterization. She was noted to have patency of the stent in her mid LAD with diffuse nonobstructive disease in the right coronary artery and left circumflex vessels. The patient's LV function was normal. She returns today for followup.  The patient has occasional chest pain over the left chest at rest. There is no change in the pattern of this over quite a long time. She denies dyspnea. She continues to smoke cigarettes. She has been on willing to take statin drugs for many years. She denies orthopnea, PND, or palpitations. She's been under a lot of family stress and has traveled back and forth between [place] and here about every 2 weeks. Her sister-in-law had brain cancer and she passed away a few months ago.  Outpatient Encounter Prescriptions as of 08/14/2012  Medication Sig Dispense Refill  . aspirin 81 MG tablet Take 81 mg by mouth daily.        . ergocalciferol (VITAMIN D2) 50000 UNITS capsule Take 1 capsule (50,000 Units total) by mouth once a week.  12 capsule  0  . ibuprofen (ADVIL,MOTRIN) 200 MG tablet Take 200 mg by mouth every 6 (six) hours as needed. pain      . levothyroxine (SYNTHROID) 112 MCG tablet Take 1 tablet (112 mcg total) by mouth daily.  90 tablet  3  . lisinopril (PRINIVIL,ZESTRIL) 20 MG tablet Take 1 tablet (20 mg total) by mouth daily.  30 tablet  11  . metoprolol (TOPROL-XL) 50 MG 24 hr tablet Take 1 tablet (50 mg total) by mouth daily.  90 tablet  6  . nitroGLYCERIN (NITROSTAT) 0.4 MG SL tablet Place 1 tablet (0.4 mg total) under the tongue every 5 (five) minutes as needed for chest pain.  25 tablet  3  . potassium chloride (K-DUR,KLOR-CON) 20 MEQ tablet Take 1 tablet (20  mEq total) by mouth daily.  30 tablet  6    No Known Allergies  Past Medical History  Diagnosis Date  . Coronary atherosclerosis of unspecified type of vessel, native or graft   . Other and unspecified hyperlipidemia   . Unspecified essential hypertension   . Backache, unspecified   . Raynaud's syndrome   . Personal history of other diseases of digestive system   . Diverticulosis of colon (without mention of hemorrhage)   . Disorder of bone and cartilage, unspecified   . Anxiety state, unspecified   . Diverticulosis of colon (without mention of hemorrhage)   . Hemorrhage of gastrointestinal tract, unspecified   . Unspecified hypothyroidism   . 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 Urinary tract infection, site not specified   . Urinary frequency   . Personal history of colonic polyps   . Hypothyroidism     ROS: Negative except as per HPI  BP 138/89  Pulse 64  Ht 5\' 6"  (1.676 m)  Wt 58.06 kg (128 lb)  BMI 20.66 kg/m2  PHYSICAL EXAM: Pt is alert and oriented, NAD HEENT: normal Neck: JVP - normal, carotids 2+= without bruits Lungs: CTA bilaterally CV: RRR with soft systolic ejection murmur at the left sternal border Abd: soft, NT, Positive BS, no hepatomegaly Ext: no C/C/E, distal pulses intact and equal Skin: warm/dry no rash  EKG:  Sinus rhythm with left axis deviation, pulmonary disease pattern. Heart rate 64 beats per minute.  ASSESSMENT AND PLAN: 1. Coronary artery disease, native vessel. The patient had a recent cardiac catheterization that demonstrated patency of her stent site and diffuse nonobstructive disease elsewhere. She should continue with medical therapy. The importance of tobacco cessation was reviewed with the patient. I do not think she will quit.  2. Hypertension. Continue current medical program. Her blood pressure is borderline today.  3. Dyslipidemia. On willing to take lipid-lowering medicines. Continue with  lifestyle modification.  For followup I would like to see the patient back in 12 months.  Tonny Bollman 08/14/2012 5:24 PM   ADDENDUM 12/09/2011: Notified of patient's upcoming colon surgery. She was seen last in September 2013 with stable symptoms. Cardiac cath done within last 12 months showed nonobstructive CAD with patency of her stent site in the LAD. She is ok to proceed with surgery as planned at low-risk of cardiac complications. She does not require further cardiac workup before surgery.  Tonny Bollman 12/08/2012 11:17 PM

## 2012-08-22 ENCOUNTER — Ambulatory Visit (INDEPENDENT_AMBULATORY_CARE_PROVIDER_SITE_OTHER): Payer: Medicare Other | Admitting: Internal Medicine

## 2012-08-22 ENCOUNTER — Encounter: Payer: Self-pay | Admitting: Internal Medicine

## 2012-08-22 VITALS — BP 120/80 | HR 76 | Temp 98.2°F | Resp 20 | Ht 66.0 in | Wt 126.0 lb

## 2012-08-22 DIAGNOSIS — I10 Essential (primary) hypertension: Secondary | ICD-10-CM

## 2012-08-22 DIAGNOSIS — I2510 Atherosclerotic heart disease of native coronary artery without angina pectoris: Secondary | ICD-10-CM

## 2012-08-22 DIAGNOSIS — M949 Disorder of cartilage, unspecified: Secondary | ICD-10-CM | POA: Diagnosis not present

## 2012-08-22 DIAGNOSIS — Z860100 Personal history of colon polyps, unspecified: Secondary | ICD-10-CM

## 2012-08-22 DIAGNOSIS — M549 Dorsalgia, unspecified: Secondary | ICD-10-CM

## 2012-08-22 DIAGNOSIS — E039 Hypothyroidism, unspecified: Secondary | ICD-10-CM

## 2012-08-22 DIAGNOSIS — H6120 Impacted cerumen, unspecified ear: Secondary | ICD-10-CM

## 2012-08-22 DIAGNOSIS — F17200 Nicotine dependence, unspecified, uncomplicated: Secondary | ICD-10-CM | POA: Insufficient documentation

## 2012-08-22 DIAGNOSIS — Z0000 Encounter for general adult medical examination without abnormal findings: Secondary | ICD-10-CM

## 2012-08-22 DIAGNOSIS — E785 Hyperlipidemia, unspecified: Secondary | ICD-10-CM

## 2012-08-22 DIAGNOSIS — M899 Disorder of bone, unspecified: Secondary | ICD-10-CM

## 2012-08-22 DIAGNOSIS — Z720 Tobacco use: Secondary | ICD-10-CM

## 2012-08-22 DIAGNOSIS — K5730 Diverticulosis of large intestine without perforation or abscess without bleeding: Secondary | ICD-10-CM

## 2012-08-22 LAB — CBC WITH DIFFERENTIAL/PLATELET
Basophils Absolute: 0 10*3/uL (ref 0.0–0.1)
Eosinophils Absolute: 0.1 10*3/uL (ref 0.0–0.7)
Hemoglobin: 14.2 g/dL (ref 12.0–15.0)
Lymphocytes Relative: 40.2 % (ref 12.0–46.0)
MCHC: 32.7 g/dL (ref 30.0–36.0)
MCV: 96.2 fl (ref 78.0–100.0)
Monocytes Absolute: 0.5 10*3/uL (ref 0.1–1.0)
Neutro Abs: 3.5 10*3/uL (ref 1.4–7.7)
RDW: 14.6 % (ref 11.5–14.6)

## 2012-08-22 LAB — LIPID PANEL
Total CHOL/HDL Ratio: 5
VLDL: 31.2 mg/dL (ref 0.0–40.0)

## 2012-08-22 LAB — COMPREHENSIVE METABOLIC PANEL
ALT: 8 U/L (ref 0–35)
AST: 15 U/L (ref 0–37)
Albumin: 4 g/dL (ref 3.5–5.2)
Alkaline Phosphatase: 86 U/L (ref 39–117)
Calcium: 9.5 mg/dL (ref 8.4–10.5)
Chloride: 105 mEq/L (ref 96–112)
Potassium: 4 mEq/L (ref 3.5–5.1)

## 2012-08-22 MED ORDER — TRAMADOL HCL 50 MG PO TABS: 50.0000 mg | ORAL_TABLET | Freq: Three times a day (TID) | ORAL | Status: DC | PRN

## 2012-08-22 MED ORDER — PRAVASTATIN SODIUM 40 MG PO TABS: 40.0000 mg | ORAL_TABLET | Freq: Every evening | ORAL | Status: DC

## 2012-08-22 NOTE — Patient Instructions (Signed)
Limit your sodium (Salt) intake  Smoking tobacco is very bad for your health. You should stop smoking immediately.  Schedule your colonoscopy to help detect colon cancer.    It is important that you exercise regularly, at least 20 minutes 3 to 4 times per week.  If you develop chest pain or shortness of breath seek  medical attention.  Return in 6 months for follow-up  Take a calcium supplement, plus [phone] units of vitamin D

## 2012-08-22 NOTE — Progress Notes (Signed)
Subjective:    Patient ID: [name], female    DOB: 1940/12/13, 71 y.o.   MRN: [id number]  HPI  71 year old patient who is seen today for a health maintenance exam. She is followed closely by cardiology for coronary artery disease. She has been on statin therapy in the past but apparently is no longer taking pravastatin. Her cardiac status has been stable. Only complaint today is back pain which has been chronic she does have a history of osteopenia. She has a history of colonic polyps and was scheduled for colonoscopy followup this year. She has ongoing tobacco use history of hypertension and dyslipidemia.  Past Medical History  Diagnosis Date  . Coronary atherosclerosis of unspecified type of vessel, native or graft   . Other and unspecified hyperlipidemia   . Unspecified essential hypertension   . Backache, unspecified   . Raynaud's syndrome   . Personal history of other diseases of digestive system   . Diverticulosis of colon (without mention of hemorrhage)   . Disorder of bone and cartilage, unspecified   . Anxiety state, unspecified   . Diverticulosis of colon (without mention of hemorrhage)   . Hemorrhage of gastrointestinal tract, unspecified   . Unspecified hypothyroidism   . 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 Urinary tract infection, site not specified   . Urinary frequency   . Personal history of colonic polyps   . Hypothyroidism     History   Social History  . Marital Status: Married    Spouse Name: N/A    Number of Children: N/A  . 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 Smoking status: Current Every Day Smoker  . Smokeless tobacco: Never Used  . Alcohol Use: No  . Drug Use: No  . Sexually Active: Not on file   Other Topics Concern  . Not on file   Social History Narrative  . No narrative on file    Past Surgical History  Procedure Date  . Coronary  angioplasty with stent placement   . Gastrointestinal stomal tumor resection   . Cholecystectomy   . Tubal ligation   . Abdominal hysterectomy   . Appendectomy     Family History  Problem Relation Age of Onset  . Heart disease Mother     Arta Bruce  . Lung cancer Father     SISTER, UNCLE  . Cerebral aneurysm Sister     UNCLE NEPHEW    No Known Allergies  Current Outpatient Prescriptions on File Prior to Visit  Medication Sig Dispense Refill  . aspirin 81 MG tablet Take 81 mg by mouth daily.        Marland Kitchen ibuprofen (ADVIL,MOTRIN) 200 MG tablet Take 200 mg by mouth every 6 (six) hours as needed. pain      . levothyroxine (SYNTHROID) 112 MCG tablet Take 1 tablet (112 mcg total) by mouth daily.  90 tablet  3  . lisinopril (PRINIVIL,ZESTRIL) 20 MG tablet Take 1 tablet (20 mg total) by mouth daily.  30 tablet  11  . metoprolol (TOPROL-XL) 50 MG 24 hr tablet Take 1 tablet (50 mg total) by mouth daily.  90 tablet  6  . nitroGLYCERIN (NITROSTAT) 0.4 MG SL tablet Place 1 tablet (0.4 mg total) under the tongue every 5 (five) minutes as needed for chest pain.  25 tablet  3  . potassium chloride (K-DUR,KLOR-CON) 20 MEQ tablet Take  1 tablet (20 mEq total) by mouth daily.  30 tablet  6    BP 120/80  Pulse 76  Temp 98.2 F (36.8 C) (Oral)  Resp 20  Ht 5\' 6"  (1.676 m)  Wt 126 lb (57.153 kg)  BMI 20.34 kg/m2  SpO2 95%    1. Risk factors, based on past  M,S,F history- patient has known coronary artery disease status post stenting cardiovascular risk factors include tobacco use dyslipidemia hypertension.  2.  Physical activities: No activity restrictions  3.  Depression/mood: No history depression or mood disorder  4.  Hearing: No deficits  5.  ADL's: Independent in all aspects of daily living  6.  Fall risk: Low  7.  Home safety: No problems identified  8.  Height weight, and visual acuity; height and weight stable no change in visual acuity  9.  Counseling: Discontinuation of  tobacco encouraged we'll resume statin therapy  10. Lab orders based on risk factors: Laboratory profile including lipid panel vitamin D TSH will be reviewed  11. Referral : Followup cardiology  12. Care plan: Followup colonoscopy this fall  13. Cognitive assessment: Alert and oriented normal affect. No cognitive dysfunction        Review of Systems  Constitutional: Negative for fever, appetite change, fatigue and unexpected weight change.  HENT: Negative for hearing loss, ear pain, nosebleeds, congestion, sore throat, mouth sores, trouble swallowing, neck stiffness, dental problem, voice change, sinus pressure and tinnitus.   Eyes: Negative for photophobia, pain, redness and visual disturbance.  Respiratory: Negative for cough, chest tightness and shortness of breath.   Cardiovascular: Negative for chest pain, palpitations and leg swelling.  Gastrointestinal: Negative for nausea, vomiting, abdominal pain, diarrhea, constipation, blood in stool, abdominal distention and rectal pain.  Genitourinary: Negative for dysuria, urgency, frequency, hematuria, flank pain, vaginal bleeding, vaginal discharge, difficulty urinating, genital sores, vaginal pain, menstrual problem and pelvic pain.  Musculoskeletal: Negative for back pain and arthralgias.  Skin: Negative for rash.  Neurological: Negative for dizziness, syncope, speech difficulty, weakness, light-headedness, numbness and headaches.  Hematological: Negative for adenopathy. Does not bruise/bleed easily.  Psychiatric/Behavioral: Negative for suicidal ideas, behavioral problems, self-injury, dysphoric mood and agitation. The patient is not nervous/anxious.        Objective:   Physical Exam  Constitutional: She is oriented to person, place, and time. She appears well-developed and well-nourished.  HENT:  Head: Normocephalic and atraumatic.  Right Ear: External ear normal.  Left Ear: External ear normal.  Mouth/Throat: Oropharynx is  clear and moist.       Edentulous  Cerumen impactions bilaterally  Eyes: Conjunctivae normal and EOM are normal.  Neck: Normal range of motion. Neck supple. No JVD present. No thyromegaly present.  Cardiovascular: Normal rate, regular rhythm, normal heart sounds and intact distal pulses.   No murmur heard. Pulmonary/Chest: Effort normal and breath sounds normal. She has no wheezes. She has no rales.  Abdominal: Soft. Bowel sounds are normal. She exhibits no distension and no mass. There is no tenderness. There is no rebound and no guarding.        Surgical scar  Genitourinary: Vagina normal.  Musculoskeletal: Normal range of motion. She exhibits no edema and no tenderness.  Neurological: She is alert and oriented to person, place, and time. She has normal reflexes. No cranial nerve deficit. She exhibits normal muscle tone. Coordination normal.  Skin: Skin is warm and dry. No rash noted.  Psychiatric: She has a normal mood and affect. Her behavior is  normal.          Assessment & Plan:   Preventive health examination Coronary artery disease Ongoing tobacco use. Total smoking cessation encouraged Dyslipidemia. We'll resume pravastatin Colonic polyps. Followup colonoscopy this year  Recheck 6 months

## 2012-08-25 ENCOUNTER — Telehealth: Payer: Self-pay

## 2012-08-25 MED ORDER — LEVOTHYROXINE SODIUM 100 MCG PO TABS: 100.0000 ug | ORAL_TABLET | Freq: Every day | ORAL | Status: DC

## 2012-08-25 NOTE — Progress Notes (Signed)
Quick Note:  Pt aware and new med to start - sent rx to [facility] ______

## 2012-08-25 NOTE — Telephone Encounter (Signed)
Pt aware of change in med r/t lab

## 2012-08-25 NOTE — Progress Notes (Signed)
Quick Note:  Current dose - please advise ______

## 2012-08-25 NOTE — Progress Notes (Signed)
Quick Note:  Spoke with pt- current dose - please advise ______

## 2012-09-01 ENCOUNTER — Other Ambulatory Visit: Payer: Self-pay | Admitting: Internal Medicine

## 2012-09-05 ENCOUNTER — Encounter: Payer: Self-pay | Admitting: Gastroenterology

## 2012-09-05 ENCOUNTER — Telehealth: Payer: Self-pay | Admitting: Cardiovascular Disease

## 2012-09-05 NOTE — Telephone Encounter (Signed)
Per pt call she went to lay down last week and then started vomiting and her family told her she might have had a heart attack and she wants to talk about that

## 2012-09-05 NOTE — Telephone Encounter (Signed)
I spoke with the pt and she said Thursday a week ago around 1 PM she had a spell of diaphoresis and laid down on the couch. The pt began vomiting just liquid because she had not eaten that day.  The pt denies any CP or SOB.  After the isolated episode the pt felt fine and she has not had any symptoms this week. At this time I do not feel like symptoms were cardiac.  The pt will contact our office with any other questions or concerns. Pt agreed with plan.

## 2012-09-08 ENCOUNTER — Ambulatory Visit (INDEPENDENT_AMBULATORY_CARE_PROVIDER_SITE_OTHER): Payer: Medicare Other | Admitting: Internal Medicine

## 2012-09-08 ENCOUNTER — Encounter: Payer: Self-pay | Admitting: Internal Medicine

## 2012-09-08 VITALS — BP 110/80 | Temp 98.3°F | Wt 128.0 lb

## 2012-09-08 DIAGNOSIS — D481 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nd other soft tissue: Secondary | ICD-10-CM

## 2012-09-08 DIAGNOSIS — D4819 Other specified neoplasm of uncertain behavior of connective and other soft tissue: Secondary | ICD-10-CM

## 2012-09-08 NOTE — Patient Instructions (Signed)
Call or return to clinic prn if these symptoms worsen or fail to improve as anticipated.

## 2012-09-08 NOTE — Progress Notes (Signed)
  Subjective:    Patient ID: [name], female    DOB: [birth year], 71 y.o.   MRN: [id number]  HPI  71 year old patient who was concerned about a lesion involving her right buttock area. This has been present for an undefined period of time but has been fairly chronic. She feels that it may be enlarging. At times the lesion becomes a bit irritated    Review of Systems  Skin: Positive for rash.       Objective:   Physical Exam  Skin:       1.5 cm flesh-colored pedunculated lesion involving the inner aspect of the right buttock area          Assessment & Plan:   Papular skin lesion benign. The patient will consider elective excision. Does not appear to be irritated or inflamed at the present time

## 2012-09-18 DIAGNOSIS — H903 Sensorineural hearing loss, bilateral: Secondary | ICD-10-CM | POA: Diagnosis not present

## 2012-09-25 ENCOUNTER — Ambulatory Visit (AMBULATORY_SURGERY_CENTER): Payer: Medicare Other | Admitting: *Deleted

## 2012-09-25 VITALS — Ht 65.5 in | Wt 127.5 lb

## 2012-09-25 DIAGNOSIS — Z86010 Personal history of colonic polyps: Secondary | ICD-10-CM

## 2012-09-25 DIAGNOSIS — Z1211 Encounter for screening for malignant neoplasm of colon: Secondary | ICD-10-CM

## 2012-09-25 MED ORDER — PEG-KCL-NACL-NASULF-NA ASC-C 100 G PO SOLR: ORAL | Status: DC

## 2012-09-29 ENCOUNTER — Other Ambulatory Visit: Payer: Self-pay | Admitting: Internal Medicine

## 2012-10-08 ENCOUNTER — Ambulatory Visit (AMBULATORY_SURGERY_CENTER): Payer: Medicare Other | Admitting: Gastroenterology

## 2012-10-08 ENCOUNTER — Encounter: Payer: Self-pay | Admitting: Gastroenterology

## 2012-10-08 VITALS — BP 138/76 | HR 67 | Temp 97.7°F | Resp 16 | Ht 65.0 in | Wt 127.0 lb

## 2012-10-08 DIAGNOSIS — R933 Abnormal findings on diagnostic imaging of other parts of digestive tract: Secondary | ICD-10-CM | POA: Diagnosis not present

## 2012-10-08 DIAGNOSIS — D126 Benign neoplasm of colon, unspecified: Secondary | ICD-10-CM

## 2012-10-08 DIAGNOSIS — Z1211 Encounter for screening for malignant neoplasm of colon: Secondary | ICD-10-CM | POA: Diagnosis not present

## 2012-10-08 DIAGNOSIS — Z86010 Personal history of colonic polyps: Secondary | ICD-10-CM

## 2012-10-08 MED ORDER — SODIUM CHLORIDE 0.9 % IV SOLN: 500.0000 mL | INTRAVENOUS | Status: DC

## 2012-10-08 NOTE — Progress Notes (Signed)
The pt tolerated the colonoscopy very well. Maw   

## 2012-10-08 NOTE — Patient Instructions (Addendum)
One of your biggest health concerns is your smoking.  This increases your risk for most cancers and serious cardiovascular diseases such as strokes, heart attacks.  You should try your best to stop.  If you need assistance, please contact your PCP or Smoking Cessation Class at [facility] [phone]) or [facility] (1-800-QUIT-NOW).   YOU HAD AN ENDOSCOPIC PROCEDURE TODAY AT [facility]: Refer to the procedure report that was given to you for any specific questions about what was found during the examination.  If the procedure report does not answer your questions, please call your gastroenterologist to clarify.  If you requested that your care partner not be given the details of your procedure findings, then the procedure report has been included in a sealed envelope for you to review at your convenience later.  YOU SHOULD EXPECT: Some feelings of bloating in the abdomen. Passage of more gas than usual.  Walking can help get rid of the air that was put into your GI tract during the procedure and reduce the bloating. If you had a lower endoscopy (such as a colonoscopy or flexible sigmoidoscopy) you Mealey notice spotting of blood in your stool or on the toilet paper. If you underwent a bowel prep for your procedure, then you Mcgovern not have a normal bowel movement for a few days.  DIET: Your first meal following the procedure should be a light meal and then it is ok to progress to your normal diet.  A half-sandwich or bowl of soup is an example of a good first meal.  Heavy or fried foods are harder to digest and [place] make you feel nauseous or bloated.  Likewise meals heavy in dairy and vegetables can cause extra gas to form and this can also increase the bloating.  Drink plenty of fluids but you should avoid alcoholic beverages for 24 hours.  ACTIVITY: Your care partner should take you home directly after the procedure.  You should plan to take it easy, moving slowly for the rest of  the day.  You can resume normal activity the day after the procedure however you should NOT DRIVE or use heavy machinery for 24 hours (because of the sedation medicines used during the test).    SYMPTOMS TO REPORT IMMEDIATELY: A gastroenterologist can be reached at any hour.  During normal business hours, 8:30 AM to 5:00 PM Monday through Friday, call [phone].  After hours and on weekends, please call the GI answering service at [phone] who will take a message and have the physician on call contact you.   Following lower endoscopy (colonoscopy or flexible sigmoidoscopy):  Excessive amounts of blood in the stool  Significant tenderness or worsening of abdominal pains  Swelling of the abdomen that is new, acute  Fever of 100F or higher  FOLLOW UP: If any biopsies were taken you will be contacted by phone or by letter within the next 1-3 weeks.  Call your gastroenterologist if you have not heard about the biopsies in 3 weeks.  Our staff will call the home number listed on your records the next business day following your procedure to check on you and address any questions or concerns that you Mccollom have at that time regarding the information given to you following your procedure. This is a courtesy call and so if there is no answer at the home number and we have not heard from you through the emergency physician on call, we will assume that you have returned  to your regular daily activities without incident.  SIGNATURES/CONFIDENTIALITY: You and/or your care partner have signed paperwork which will be entered into your electronic medical record.  These signatures attest to the fact that that the information above on your After Visit Summary has been reviewed and is understood.  Full responsibility of the confidentiality of this discharge information lies with you and/or your care-partner.    Ok to resume your normal medications  Await pathology for further recommendations

## 2012-10-08 NOTE — Progress Notes (Signed)
Patient did not experience any of the following events: a burn prior to discharge; a fall within the facility; wrong site/side/patient/procedure/implant event; or a hospital transfer or hospital admission upon discharge from the facility. (G8907) Patient did not have preoperative order for IV antibiotic SSI prophylaxis. (G8918)  

## 2012-10-08 NOTE — Op Note (Signed)
[facility] [address] [place], [address]   COLONOSCOPY PROCEDURE REPORT  PATIENT: [name], [name]  MR#: [id number] BIRTHDATE: [birth year] , 71  yrs. old GENDER: Female ENDOSCOPIST: Rachael Fee, MD PROCEDURE DATE:  10/08/2012 PROCEDURE:   Colonoscopy with biopsy and Colonoscopy with snare polypectomy ASA CLASS:   Class III INDICATIONS:2010 colonoscopies (2) for adenomatous polyps, one at hepatic flexure was removed piecemeal and site labeled, at time of second 2010 colnoscopy that site showed no adenomatous mucosa. MEDICATIONS: Fentanyl 75 mcg IV, Versed 6 mg IV, and These medications were titrated to patient response per physician's verbal order  DESCRIPTION OF PROCEDURE:   After the risks benefits and alternatives of the procedure were thoroughly explained, informed consent was obtained.  A digital rectal exam revealed no abnormalities of the rectum.   The LB [id number]-[id number]  endoscope was introduced through the anus and advanced to the cecum, which was identified by both the appendix and ileocecal valve. No adverse events experienced.   The quality of the prep was Moviprep fair The instrument was then slowly withdrawn as the colon was fully examined.  COLON FINDINGS: The site of hepatic flexure polyp resection 2010 was clear by persistent [place] Ink in mucosa.  This area on fold was slightly adenomatous appearing with very indinstinct margins.  The site was biospied extensively.  There was also a polyp in sigmoid colon, removed and sent to pathology.  This was 3mm across, removed with cold snare.  There was mild, left sided diverticulosis.  The examination was otherwise normal.  Retroflexed views revealed no abnormalities. The time to cecum=3 minutes 20 seconds.  Withdrawal time=13 minutes 35 seconds.  The scope was withdrawn and the procedure completed. COMPLICATIONS: There were no complications.  ENDOSCOPIC IMPRESSION: The site of hepatic  flexure polyp resection 2010 was clear by persistent [place] Ink in mucosa.  This area on fold was slightly adenomatous appearing with very indinstinct margins.  The site was biospied extensively. There was also a polyp in sigmoid colon, removed and sent to pathology. There was mild, left sided diverticulosis. The examination was otherwise normal.  RECOMMENDATIONS: Await pathology for final recommendations.  eSigned:  Rachael Fee, MD 10/08/2012 11:05 AM   cc: Derryl Harbor, MD

## 2012-10-09 ENCOUNTER — Telehealth: Payer: Self-pay

## 2012-10-09 NOTE — Telephone Encounter (Signed)
  Follow up Call-  Call back number 10/08/2012  Post procedure Call Back phone  # [phone]  Permission to leave phone message Yes     Patient questions:  Do you have a fever, pain , or abdominal swelling? no Pain Score  0 *  Have you tolerated food without any problems? yes  Have you been able to return to your normal activities? yes  Do you have any questions about your discharge instructions: Diet   no Medications  no Follow up visit  no  Do you have questions or concerns about your Care? no  Actions: * If pain score is 4 or above: No action needed, pain <4.  Per the pt, "I did fine". Maw

## 2012-10-14 DIAGNOSIS — H903 Sensorineural hearing loss, bilateral: Secondary | ICD-10-CM | POA: Diagnosis not present

## 2012-10-17 ENCOUNTER — Telehealth: Payer: Self-pay | Admitting: Gastroenterology

## 2012-10-17 NOTE — Telephone Encounter (Signed)
Pt notified the results are not yet available and she will be called as soon as read

## 2012-11-12 ENCOUNTER — Ambulatory Visit (INDEPENDENT_AMBULATORY_CARE_PROVIDER_SITE_OTHER): Payer: Medicare Other | Admitting: Gastroenterology

## 2012-11-12 ENCOUNTER — Telehealth: Payer: Self-pay

## 2012-11-12 ENCOUNTER — Encounter: Payer: Self-pay | Admitting: Gastroenterology

## 2012-11-12 VITALS — BP 122/74 | HR 76 | Ht 65.0 in | Wt 128.1 lb

## 2012-11-12 DIAGNOSIS — D126 Benign neoplasm of colon, unspecified: Secondary | ICD-10-CM | POA: Diagnosis not present

## 2012-11-12 DIAGNOSIS — K635 Polyp of colon: Secondary | ICD-10-CM

## 2012-11-12 NOTE — Telephone Encounter (Signed)
Message copied by Donata Duff on Wed Nov 12, 2012 11:21 AM ------      Message from: Marnette Burgess      Created: Wed Nov 12, 2012 10:56 AM       Patient is scheduled to see Dr. Almond Lint on 12/05/12 @ 1:30pm, arrive @ 1:00pm.  If you have any questions please call [phone].            Thank You,      Elane Fritz      ----- Message -----         From: Donata Duff, CMA         Sent: 11/12/2012   9:50 AM           To: Marnette Burgess            Pt needs appt to discuss segmental colectomy for hepatic flexure polyp

## 2012-11-12 NOTE — Patient Instructions (Addendum)
One of your biggest health concerns is your smoking.  This increases your risk for most cancers and serious cardiovascular diseases such as strokes, heart attacks.  You should try your best to stop.  If you need assistance, please contact your PCP or Smoking Cessation Class at [facility] [phone]) or [facility] Quit-Line (1-800-QUIT-NOW). We will get you back in to see surgeon at [facility] to discuss segmental colectomy for the hepatic flexure polyp.  The other option to remove the polyp is by colonoscopy as we discussed today in the office.

## 2012-11-12 NOTE — Progress Notes (Signed)
Review of gastrointestinal problems:  1. Periampullary mass seen on endoscopy in April of 2008, 2-3 cm, somewhat ulcerated. Biopsy showed focal ulceration, but no granulomas or malignancy was identified. CT scan of abdomen and pelvis with IV and oral contrast also documented a 3.2 cm periampullary mass, enhancing. The patient was on Plavix at the time of EGD. Mucosal biopsy of the mass caused more than usual bleeding which was self-limited, although she did have some melena for 1-2 days. Follow-up endoscopic ultrasound on Apr 17, 2007, showed a 2.5 periampullary duodenal mass located 2 cm distal to the major papilla, quite vascular. It was oozing following multiple biopsies. Biopsies showed spindle cell proliferation (GIST versus reactive changes). Resection by Dr. Marilynn Rail 2008,with choledocotomy, CBD cannulation, pathologic diagnosis confirmed GIST  2. Question sigmoid diverticulitis seen on CT scan. She completed a Cipro/Flagyl course. Was recommended to have a colonoscopy in the past but deferred.  3. Adenomatous colon polyps, colonoscopy January 2010(4 polyps, some piecemeal). recall colonoscopy at 6 month interval. Repeat colonoscopy July 2011 found no recurrent adenomatous polyps. Repeat colonoscopy 10/2012 found hepatix flexure residual polyp, indistinct margins, easy to locate due to previous [place] Ink Tatooing in 2010. 4. Right lower quadrant pain; 2011 CT scan, CBC, cemented all normal. EGD April 2019found normal postoperative stomach, mild gastritis which was negative for H. pylori on biopsies.  HPI: This is a    very pleasant 71 year old woman who is here with her husband today. We reviewed her colonoscopy from last month.    Past Medical History  Diagnosis Date  . Coronary atherosclerosis of unspecified type of vessel, native or graft   . Other and unspecified hyperlipidemia   . Unspecified essential hypertension   . Backache, unspecified   . Raynaud's syndrome   . Personal history of  other diseases of digestive system   . Diverticulosis of colon (without mention of hemorrhage)   . Disorder of bone and cartilage, unspecified   . Diverticulosis of colon (without mention of hemorrhage)   . Hemorrhage of gastrointestinal tract, unspecified   . Unspecified hypothyroidism   . 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 Urinary tract infection, site not specified   . Urinary frequency   . Personal history of colonic polyps   . Hypothyroidism   . Anxiety state, unspecified     no per pt  . Arthritis   . Blood transfusion without reported diagnosis   . Myocardial infarction     2009  . Ulcer     Past Surgical History  Procedure Date  . Coronary angioplasty with stent placement     x1  . Gastrointestinal stomal tumor resection   . Cholecystectomy   . Tubal ligation   . Abdominal hysterectomy   . Appendectomy     Current Outpatient Prescriptions  Medication Sig Dispense Refill  . aspirin 81 MG tablet Take 81 mg by mouth daily.        Marland Kitchen ibuprofen (ADVIL,MOTRIN) 200 MG tablet Take 200 mg by mouth every 6 (six) hours as needed. pain      . levothyroxine (SYNTHROID, LEVOTHROID) 100 MCG tablet Take 1 tablet (100 mcg total) by mouth daily.  90 tablet  3  . lisinopril (PRINIVIL,ZESTRIL) 20 MG tablet TAKE ONE TABLET BY MOUTH ONE TIME DAILY  90 tablet  1  . metoprolol succinate (TOPROL-XL) 50 MG 24 hr tablet TAKE ONE TABLET BY MOUTH ONE TIME DAILY  90 tablet  3  . nitroGLYCERIN (NITROSTAT) 0.4  MG SL tablet Place 1 tablet (0.4 mg total) under the tongue every 5 (five) minutes as needed for chest pain.  25 tablet  3    Allergies as of 11/12/2012  . (No Known Allergies)    Family History  Problem Relation Age of Onset  . Heart disease Mother     Arta Bruce  . Lung cancer Father     SISTER, UNCLE  . Cerebral aneurysm Sister     UNCLE NEPHEW  . Colon cancer Neg Hx   . Esophageal cancer Neg Hx   . Stomach cancer Neg Hx   . Rectal  cancer Neg Hx     History   Social History  . Marital Status: Married    Spouse Name: N/A    Number of Children: N/A  . 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 Smoking status: Current Every Day Smoker -- 0.5 packs/day  . Smokeless tobacco: Never Used  . Alcohol Use: No  . Drug Use: No  . Sexually Active: Not on file   Other Topics Concern  . Not on file   Social History Narrative  . No narrative on file      Physical Exam: BP 122/74  Pulse 76  Ht 5\' 5"  (1.651 m)  Wt 128 lb 2 oz (58.117 kg)  BMI 21.32 kg/m2 Constitutional: generally well-appearing Psychiatric: alert and oriented x3 Abdomen: soft, nontender, nondistended, no obvious ascites, no peritoneal signs, normal bowel sounds     Assessment and plan: 71 y.o. female with residual adenomatous polyp at the hepatic flexure.  I do think we should strive to remove the residual, recurrent adenomatous polyp at her hepatic flexure. Options for this include repeat colonoscopy which possibly would require repeat colonoscopy 2 or 3 times. She understands that this could lead to complications such as perforation and bleeding. The other option is surgical resection of the hepatic flexure, hopefully segmental resection only. The polyp site is clearly labeled with [place] ink that was placed 3 years ago. She is going to sit down with a general surgeon to discuss that option before making a decision on which approach she prefers.

## 2012-11-12 NOTE — Telephone Encounter (Signed)
Blanca to notify pt  

## 2012-11-24 ENCOUNTER — Telehealth: Payer: Self-pay | Admitting: Internal Medicine

## 2012-11-24 NOTE — Telephone Encounter (Signed)
Patient Information:  Caller Name: Alinda Money  Phone: [phone]  Patient: [name], [name]  Gender: Female  DOB: 07/10/1941  Age: 71 Years  PCP: Eleonore Chiquito [facility])  Office Follow Up:  Does the office need to follow up with this patient?: No  Instructions For The Office: N/A  RN Note:  States started in the sinuses and has now progressed to the chest.  Productive cough with clear sputum  Symptoms  Reason For Call & Symptoms: Cough and congestion  Reviewed Health History In EMR: Yes  Reviewed Medications In EMR: Yes  Reviewed Allergies In EMR: Yes  Reviewed Surgeries / Procedures: Yes  Date of Onset of Symptoms: 11/17/2012  Treatments Tried: Mucinex, Advil  Treatments Tried Worked: No  Guideline(s) Used:  Colds  Disposition Per Guideline:   Home Care  Reason For Disposition Reached:   Colds with no complications  Advice Given:  For a Runny Nose With Profuse Discharge:   Nasal mucus and discharge helps to wash viruses and bacteria out of the nose and sinuses.  Blowing the nose is all that is needed.  If the skin around your nostrils gets irritated, apply a tiny amount of petroleum ointment to the nasal openings once or twice a day.  For a Stuffy Nose - Use Nasal Washes:  Step-By-Step Instructions:   Step 1: Lean over a sink.  Step 2: Gently squirt or spray warm salt water into one of your nostrils.  Step 3: Some of the water may run into the back of your throat. Spit this out. If you swallow the salt water it will not hurt you.  Humidifier:  If the air in your home is dry, use a cool-mist humidifier  Expected Course:   Nasal discharge 7-14 days  Cough up to 2-3 weeks.  Expected Course:   Nasal discharge 7-14 days  Cough up to 2-3 weeks.  Call Back If:  Difficulty breathing occurs  Nasal discharge lasts more than 10 days  Cough lasts more than 3 weeks  You become worse  Cough Medicines:  OTC Cough Syrups: The most common cough suppressant in OTC  cough medications is dextromethorphan. Often the letters "DM" appear in the name.  Home Remedy - Honey: This old home remedy has been shown to help decrease coughing at night. The adult dosage is 2 teaspoons (10 ml) at bedtime. Honey should not be given to infants under one year of age.

## 2012-12-05 ENCOUNTER — Ambulatory Visit (INDEPENDENT_AMBULATORY_CARE_PROVIDER_SITE_OTHER): Payer: Medicare Other | Admitting: General Surgery

## 2012-12-05 ENCOUNTER — Encounter (INDEPENDENT_AMBULATORY_CARE_PROVIDER_SITE_OTHER): Payer: Self-pay | Admitting: General Surgery

## 2012-12-05 ENCOUNTER — Encounter (INDEPENDENT_AMBULATORY_CARE_PROVIDER_SITE_OTHER): Payer: Self-pay

## 2012-12-05 VITALS — BP 138/80 | HR 72 | Temp 96.7°F | Resp 20 | Ht 65.0 in | Wt 129.8 lb

## 2012-12-05 DIAGNOSIS — R1031 Right lower quadrant pain: Secondary | ICD-10-CM | POA: Diagnosis not present

## 2012-12-05 DIAGNOSIS — D126 Benign neoplasm of colon, unspecified: Secondary | ICD-10-CM | POA: Diagnosis not present

## 2012-12-05 NOTE — Patient Instructions (Addendum)
Cancer of the Colon or precancerous polyps, Surgical Treatment Based on the location and type of polyp that you have, we have determined that surgical removal of this part of your colon will be part of your treatment.  This will treat symptoms of bleeding or blockage that you may be experiencing.  I will do everything that I can to remove your entire area safely. To do this, some normal tissue must also be removed to give you the best chance for a cure. The following will help describe what happens when you have this surgery.  TREATMENT Surgery is the most common treatment for colorectal cancer or polyps that cannot be removed with colonoscopy. It is a type of local therapy. This tissue includes an apron of tissue with lymph nodes and blood vessels.  We usually use laparoscopy (smaller incisions) to help minimize the size of the larger incision on your abdomen.  However, even if we use laparoscopy primarily, we still need to make an incision large enough to bring the two ends of the colon out to reconnect them.  For tumors that are very large or very adherent to the adjacent structures, we may have to make a larger incision to do your operation safely. YOU MAY NEED A LARGER INCISION BASED ON YOUR PRIOR SURGERY.    When a section of the colon or rectum is removed, we can usually reconnect the healthy parts. However, sometimes reconnection is not possible. In this case, we will make a new path for your bowel movements to leave the body. This is an opening (a stoma) in the wall of the abdomen. The upper end of the intestine is then connected to the stoma. The other end is closed. The operation to create the stoma is called a colostomy. A flat bag fits over the stoma to collect waste, and a special adhesive holds it in place.  Most patients are able to do their normal activities with a colostomy, including travelling, exercising, and swimming.  THIS IS VERY UNLIKELY IN YOUR CASE.    Some colostomies are  temporary. The colostomy is needed only until the colon or rectum heals from surgery. Some patients will need to get additional treatment such as chemotherapy before the ostomy can be taken down.  After this occurs, we can reconnect the parts of the intestine and closes the stoma.  Some  patients need a permanent colostomy, and some patients are too frail to undergo another surgery to take the ostomy down.    The part of colon that we plan to remove in your case is the right colon.  Whatever the operative findings, I will discuss the case with your family after we are done in the operating room.  I will talk to you in the next few days when you are more awake.  I will see you in the hospital every weekday that I am not out of town.  My partners help see patients on the weekends and if I am out of town.     IF YOU ARE TAKING ASPIRIN, PLAVIX, COUMADIN, OR OTHER BLOOD THINNERS, LET us KNOW IMMEDIATELY SO WE CAN CONTACT YOUR PRESCRIBING HEALTH CARE PROVIDER TO HOLD THE MEDICATION FOR 5-7 DAYS BEFORE SURGERY    WHAT HAPPENS AFTER SURGERY: After surgery, you will go first to the recovery room, then to your room.   You will have many tubes and lines in place which is standard for this operation.  You will have several IVs, including possibly a central  line in your chest or neck.  YOU WILL NOT BE ABLE TO EAT FOR SEVERAL DAYS AFTER SURGERY.  You will have a catheter in your bladder for around 1-3 days.  On your abdomen, you will have and possibly a pain pump with numbing medicine.  You will have compression stockings on your legs to decrease the risk of blood clots.    We will address your pain in several ways.  We will use an IV pain pump called a "PCA," or Patient Controlled Analgesia.  This allows you to press a button and immediately receive a dose of pain medication without waiting for a nurse.  We also use IV Tylenol and sometimes IV Toradol which is similar to ibuprofen.  You may also have a pump with  numbing medicine delivered directly to your incision.  I use doses and medications that work for the majority of people, but you may need an adjustment to the dose or type of medicine if your pain is not adequately controlled.  Your throat may be sore, in which case you may need a throat spray or lozenges.  Some patients become disoriented with the pain medication and may need adjustments due to that.    We will ask you to get out of bed the day after surgery in order to maximize your chances of not having complications.  Your risk of pneumonia and blood clots is lower with walking and sitting in the chair.  We will also ask you to perform breathing exercises.  We will also ask to you walk in your room and in the halls for the above reasons, but also in order for you to keep up your strength.    EATING: We will usually start you on clear liquids in around 1-2 days if your bowel function seems to be returning.  We advance your diet slowly to make sure you are tolerating each step.  All patients do not have a normal appetite when they go home.  Many patients also find that their taste buds do not seem the same right after surgery, and this can continue into the time of possible post operative chemotherapy and radiation.  We may need to use different doses or type of medicine than you use at home to control high blood pressure, diabetes, or other medical problems.   SLEEPING: We do not give sleeping medications in the hospital routinely.  Many patients have difficulty sleeping due to the unfamiliar environment or the medical care that occurs at night.  Sleeping medications can interfere with the pain medications and cause significant mental status changes that are dangerous.    OTHER TREATMENT? If you have any cancer present in your specimen (UNLIKELY) I will present your case when the pathology is available at our GI Multidisciplinary conference which occurs every 1-2 weeks.  Medical and Radiation  Oncologists are present, as well as the pathologist and radiologist in addition to myself.  If you have a larger tumor or if you have positive lymph nodes, we may have the oncologist stop by to see you while you are in the hospital.  Most firm post op treatment plans occur, however, once you are an outpatient because we will need to see how you are recovering from surgery.  GOING HOME! Usually you are able to go home in 4-8 days, depending on whether or not complications happen and what is going on with your overall health status.   If you have more health problems or if you  have limited help at home, the therapists and nurses may recommend a temporary rehab or nursing facility to help you get back on your feet before you go home.  These decisions would be made while you are in the hospital with the assistance of a social worker or case manager.  You cannot do any heavy lifting for 6-8 weeks due to the risk of hernia.    Please bring all insurance/disability forms to our office for the staff to fill out   POSSIBLE COMPLICATIONS This is a major operation and includes complications listed below: Bleeding Infection and possible wound complications such as hernia Damage to adjacent structures Leak of surgical connections, which can lead to other surgeries and possibly an ostomy. (5-10%) Possible need for other procedures, such as abscess drains in radiology  Possible prolonged hospital stay Possible diarrhea from removal of part of the colon. Possible constipation from narcotics.    Prolonged fatigue/weakness/appetite MOST PATIENTS' ENERGY LEVEL IS NOT BACK TO NORMAL FOR AT LEAST 2-4 MONTHS.  OLDER PATIENTS MAY FEEL WEAK FOR LONGER PERIODS OF TIME.   Difficulty with eating or post operative nausea  Possible early recurrence of cancer Possible complications of your medical problems such as heart disease or arrhythmias. Death (less than 1%)  All possible complications are not listed, just the  most common.    FURTHER INFORMATION? Please ask questions if you find something that we did not discuss in the office and would like more information.  If you would like another appointment if you have many questions or if your family members would like to come as well, please contact the office.    FOLLOW-UP CARE  Follow-up care after treatment for colorectal cancer/polyps is important. Even when the cancer seems to have been completely removed or destroyed, the disease sometimes returns. Undetected cancer cells may still remain somewhere in the body after treatment. We check your recovery and for recurrence of the cancer. Recurrence means that the cancer comes back.  Checkups help make sure that changes in health are found. Checkups may include:  A physical exam (possibly including a digital rectal exam). This means your caregiver checks you to see if there are any abnormal changes they can see or feel.   Lab tests (CEA test) may be done. The "fecal occult blood test" checks for blood in the stool. The CEA (carcinoembryonic antigen) is a blood test that looks for a marker of colon cancer in the blood.   A colonoscopy is a test where your caregiver examines your colon with a flexible instrument like a thin telescope which looks at the inside of the large bowel. All patients with colon cancer are recommended to get a colonoscopy 1 year after the surgery  Other specialized x-rays, CT scans, or other tests may be performed.    Between scheduled visits you should contact your caregivers as soon as any health problems appear.

## 2012-12-05 NOTE — Progress Notes (Signed)
Chief Complaint:  Polyp at hepatic flexure   HISTORY: Patient is a 72 year old female who has had a history of colonic polyps and a mother who had colon cancer.  She has had numerous colonoscopies for polyps. She has had a polyp now in her hepatic flexure that has come back multiple times. Because of its location,  Dr. Christella Hartigan has not been able to be confident of the negative margin.  She is referred for segmental colon resection. She is on an 91 GI bleeding as she is aware of. Of note, she has had a GI stromal tumor taken from her duodenum and 2008 by Dr. Marilynn Rail at [facility]. Recently, she has had right lower coronary pain. She says this has come back out while, but she has not had this evaluated other than the colonoscopy for several years. Her last CT scan was in 2011.    Past Medical History  Diagnosis Date  . Coronary atherosclerosis of unspecified type of vessel, native or graft   . Other and unspecified hyperlipidemia   . Unspecified essential hypertension   . Backache, unspecified   . Raynaud's syndrome   . Personal history of other diseases of digestive system   . Diverticulosis of colon (without mention of hemorrhage)   . Disorder of bone and cartilage, unspecified   . Diverticulosis of colon (without mention of hemorrhage)   . Hemorrhage of gastrointestinal tract, unspecified   . Unspecified hypothyroidism   . 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 Urinary tract infection, site not specified   . Urinary frequency   . Personal history of colonic polyps   . Hypothyroidism   . Anxiety state, unspecified     no per pt  . Arthritis   . Blood transfusion without reported diagnosis   . Myocardial infarction     2009  . Ulcer     Past Surgical History  Procedure Date  . Coronary angioplasty with stent placement     x1  . Gastrointestinal stomal tumor resection   . Cholecystectomy   . Tubal ligation   . Abdominal hysterectomy   . Appendectomy      Current Outpatient Prescriptions  Medication Sig Dispense Refill  . aspirin 81 MG tablet Take 81 mg by mouth daily.        Marland Kitchen ibuprofen (ADVIL,MOTRIN) 200 MG tablet Take 200 mg by mouth every 6 (six) hours as needed. pain      . levothyroxine (SYNTHROID, LEVOTHROID) 100 MCG tablet Take 1 tablet (100 mcg total) by mouth daily.  90 tablet  3  . lisinopril (PRINIVIL,ZESTRIL) 20 MG tablet TAKE ONE TABLET BY MOUTH ONE TIME DAILY  90 tablet  1  . metoprolol succinate (TOPROL-XL) 50 MG 24 hr tablet TAKE ONE TABLET BY MOUTH ONE TIME DAILY  90 tablet  3  . nitroGLYCERIN (NITROSTAT) 0.4 MG SL tablet Place 1 tablet (0.4 mg total) under the tongue every 5 (five) minutes as needed for chest pain.  25 tablet  3     No Known Allergies   Family History  Problem Relation Age of Onset  . Heart disease Mother     Arta Bruce  . Lung cancer Father     SISTER, UNCLE  . Cerebral aneurysm Sister     UNCLE NEPHEW  . Colon cancer Neg Hx   . Esophageal cancer Neg Hx   . Stomach cancer Neg Hx   . Rectal cancer Neg Hx      History  Social History  . Marital Status: Married    Spouse Name: N/A    Number of Children: N/A  . 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 Smoking status: Current Every Day Smoker -- 0.5 packs/day  . Smokeless tobacco: Never Used  . Alcohol Use: No  . Drug Use: No  . Sexually Active: None      REVIEW OF SYSTEMS - PERTINENT POSITIVES ONLY: 12 point review of systems negative other than HPI and PMH except for nasal congestion.  EXAM: Filed Vitals:   12/05/12 1344  BP: 138/80  Pulse: 72  Temp: 96.7 F (35.9 C)  Resp: 20    Gen:  No acute distress.  Well nourished and well groomed.   Neurological: Alert and oriented to person, place, and time. Coordination normal.  Head: Normocephalic and atraumatic.  Eyes: Conjunctivae are normal. Pupils are equal, round, and reactive to light. No scleral icterus.   Neck: Normal range of motion. Neck supple. No tracheal deviation or thyromegaly present.  Cardiovascular: Normal rate, regular rhythm, normal heart sounds and intact distal pulses.  Exam reveals no gallop and no friction rub.  No murmur heard. Respiratory: Effort normal.  No respiratory distress. No chest wall tenderness. Breath sounds coarse.  GI: Soft. Bowel sounds are normal. The abdomen is soft and nontender.  There is no rebound and no guarding. Midline scar.   Musculoskeletal: Normal range of motion. Extremities are nontender.  Lymphadenopathy: No cervical, preauricular, postauricular or axillary adenopathy is present Skin: Skin is warm and dry. No rash noted. No diaphoresis. No erythema. No pallor. No clubbing, cyanosis, or edema.   Psychiatric: Normal mood and affect. Behavior is normal. Judgment and thought content normal.    LABORATORY RESULTS: Available labs are reviewed  No recent   RADIOLOGY RESULTS: See E-Chart or I-Site for most recent results.  Images and reports are reviewed. None recent.   ASSESSMENT AND PLAN: Adenomatous colon polyp Patient has a recurrent serrated polyp at the hepatic flexure. This is then unable to be resected completely a colonoscopy. I would plan to do a right hemicolectomy.  I would still plan to attempt this laparoscopically. However, with her prior surgery and extended midline incision, she is at high risk to require an open operation. I discussed this with her.  I advised the patient to stop smoking to decrease her risk of colon leak, wound infection, and postoperative pneumonia.  I discussed complications such as: Bleeding Infection and possible wound complications such as hernia Damage to adjacent structures Leak of surgical connections, which can lead to other surgeries and possibly an ostomy. (5-10%) Possible need for other procedures, such as abscess drains in radiology  Possible prolonged hospital stay Possible diarrhea from  removal of part of the colon. Possible constipation from narcotics.    Prolonged fatigue/weakness/appetite MOST PATIENTS' ENERGY LEVEL IS NOT BACK TO NORMAL FOR AT LEAST 2-4 MONTHS.  OLDER PATIENTS MAY FEEL WEAK FOR LONGER PERIODS OF TIME.   Difficulty with eating or post operative nausea  Possible cancer in final pathology Possible complications of your medical problems such as heart disease or arrhythmias. Death (less than 1%)  She will need cardiac clearance from Dr. Excell Seltzer.     Also, based on her history of RLQ pain and GIST, I will order CT scan.     Maudry Diego MD Surgical Oncology, General and Endocrine Surgery [facility]  Visit Diagnoses: 1. Abdominal pain, right lower quadrant   2. Adenomatous colon polyp     Primary Care Physician: Rogelia Boga, MD

## 2012-12-05 NOTE — Assessment & Plan Note (Addendum)
Patient has a recurrent serrated polyp at the hepatic flexure. This is then unable to be resected completely a colonoscopy. I would plan to do a right hemicolectomy.  I would still plan to attempt this laparoscopically. However, with her prior surgery and extended midline incision, she is at high risk to require an open operation. I discussed this with her.  I advised the patient to stop smoking to decrease her risk of colon leak, wound infection, and postoperative pneumonia.  I discussed complications such as: Bleeding Infection and possible wound complications such as hernia Damage to adjacent structures Leak of surgical connections, which can lead to other surgeries and possibly an ostomy. (5-10%) Possible need for other procedures, such as abscess drains in radiology  Possible prolonged hospital stay Possible diarrhea from removal of part of the colon. Possible constipation from narcotics.    Prolonged fatigue/weakness/appetite MOST PATIENTS' ENERGY LEVEL IS NOT BACK TO NORMAL FOR AT LEAST 2-4 MONTHS.  OLDER PATIENTS MAY FEEL WEAK FOR LONGER PERIODS OF TIME.   Difficulty with eating or post operative nausea  Possible cancer in final pathology Possible complications of your medical problems such as heart disease or arrhythmias. Death (less than 1%)  She will need cardiac clearance from Dr. Excell Seltzer.

## 2012-12-09 ENCOUNTER — Telehealth: Payer: Self-pay | Admitting: Cardiovascular Disease

## 2012-12-09 ENCOUNTER — Encounter (INDEPENDENT_AMBULATORY_CARE_PROVIDER_SITE_OTHER): Payer: Self-pay

## 2012-12-09 ENCOUNTER — Other Ambulatory Visit: Payer: Medicare Other

## 2012-12-09 ENCOUNTER — Ambulatory Visit (INDEPENDENT_AMBULATORY_CARE_PROVIDER_SITE_OTHER): Payer: Medicare Other | Admitting: Internal Medicine

## 2012-12-09 ENCOUNTER — Encounter: Payer: Self-pay | Admitting: Internal Medicine

## 2012-12-09 ENCOUNTER — Telehealth (INDEPENDENT_AMBULATORY_CARE_PROVIDER_SITE_OTHER): Payer: Self-pay

## 2012-12-09 VITALS — BP 140/80 | HR 84 | Temp 98.4°F | Resp 20 | Wt 132.0 lb

## 2012-12-09 DIAGNOSIS — F17200 Nicotine dependence, unspecified, uncomplicated: Secondary | ICD-10-CM

## 2012-12-09 DIAGNOSIS — Z720 Tobacco use: Secondary | ICD-10-CM

## 2012-12-09 DIAGNOSIS — D126 Benign neoplasm of colon, unspecified: Secondary | ICD-10-CM | POA: Diagnosis not present

## 2012-12-09 DIAGNOSIS — I2510 Atherosclerotic heart disease of native coronary artery without angina pectoris: Secondary | ICD-10-CM

## 2012-12-09 DIAGNOSIS — J441 Chronic obstructive pulmonary disease with (acute) exacerbation: Secondary | ICD-10-CM

## 2012-12-09 DIAGNOSIS — I10 Essential (primary) hypertension: Secondary | ICD-10-CM

## 2012-12-09 MED ORDER — AZITHROMYCIN 250 MG PO TABS: ORAL_TABLET | ORAL | Status: DC

## 2012-12-09 NOTE — Patient Instructions (Signed)
Take your antibiotic as prescribed until ALL of it is gone, but stop if you develop a rash, swelling, or any side effects of the medication.  Contact our office as soon as possible if  there are side effects of the medication.  Smoking tobacco is very bad for your health. You should stop smoking immediately.  Dulera 1 puff twice daily   Take over-the-counter expectorants and cough medications such as  Mucinex DM.  Call if there is no improvement in 5 to 7 days or if he developed worsening cough, fever, or new symptoms, such as shortness of breath or chest pain.

## 2012-12-09 NOTE — Telephone Encounter (Signed)
Bernie from Dr [facility] office called today regarding the  surgical  consult for possible partial colectomy on pt. [name] said the request form was faxed to this office on 12/05/12 .

## 2012-12-09 NOTE — Telephone Encounter (Signed)
Dr Donell Beers needs surgical  Consult for poss open partial colectomy faxed request 12-05-12, checking on status

## 2012-12-09 NOTE — Telephone Encounter (Signed)
Pt had to cancel her CT scan due to illness.  I gave her the telephone number for [facility] and instructions

## 2012-12-09 NOTE — Progress Notes (Unsigned)
Bowel prep and Rx mailed to pt this date.

## 2012-12-09 NOTE — Progress Notes (Signed)
Subjective:    Patient ID: [name], female    DOB: 05-17-1941, 72 y.o.   MRN: [id number]  HPI  72 year old patient who has a history of stable coronary artery disease and ongoing tobacco use. She is scheduled for an elective colectomy in the near future. She states that since Thanksgiving she has had chest congestion and cough;  she's been using Mucinex..  Cough is productive of thick slightly discolored sputum. She has noticed an occasional congestion and wheezing. She continues to smoke but has moderated somewhat. She states that she is scheduled for an abdominal CT later today.  Past Medical History  Diagnosis Date  . Coronary atherosclerosis of unspecified type of vessel, native or graft   . Other and unspecified hyperlipidemia   . Unspecified essential hypertension   . Backache, unspecified   . Raynaud's syndrome   . Personal history of other diseases of digestive system   . Diverticulosis of colon (without mention of hemorrhage)   . Disorder of bone and cartilage, unspecified   . Diverticulosis of colon (without mention of hemorrhage)   . Hemorrhage of gastrointestinal tract, unspecified   . Unspecified hypothyroidism   . 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 Urinary tract infection, site not specified   . Urinary frequency   . Personal history of colonic polyps   . Hypothyroidism   . Anxiety state, unspecified     no per pt  . Arthritis   . Blood transfusion without reported diagnosis   . Myocardial infarction     2009  . Ulcer     History   Social History  . Marital Status: Married    Spouse Name: N/A    Number of Children: N/A  . 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 Smoking status: Current Every Day Smoker -- 0.5 packs/day  . Smokeless tobacco: Never Used  . Alcohol Use: No  . Drug Use: No  . Sexually Active: Not on file   Other Topics Concern  . Not on file     Social History Narrative  . No narrative on file    Past Surgical History  Procedure Date  . Coronary angioplasty with stent placement     x1  . Gastrointestinal stomal tumor resection   . Cholecystectomy   . Tubal ligation   . Abdominal hysterectomy   . Appendectomy     Family History  Problem Relation Age of Onset  . Heart disease Mother     Arta Bruce  . Lung cancer Father     SISTER, UNCLE  . Cerebral aneurysm Sister     UNCLE NEPHEW  . Colon cancer Neg Hx   . Esophageal cancer Neg Hx   . Stomach cancer Neg Hx   . Rectal cancer Neg Hx     No Known Allergies  Current Outpatient Prescriptions on File Prior to Visit  Medication Sig Dispense Refill  . aspirin 81 MG tablet Take 81 mg by mouth daily.        Marland Kitchen ibuprofen (ADVIL,MOTRIN) 200 MG tablet Take 200 mg by mouth every 6 (six) hours as needed. pain      . levothyroxine (SYNTHROID, LEVOTHROID) 100 MCG tablet Take 1 tablet (100 mcg total) by mouth daily.  90 tablet  3  . lisinopril (PRINIVIL,ZESTRIL) 20 MG tablet TAKE ONE TABLET BY MOUTH ONE TIME DAILY  90 tablet  1  .  metoprolol succinate (TOPROL-XL) 50 MG 24 hr tablet TAKE ONE TABLET BY MOUTH ONE TIME DAILY  90 tablet  3  . nitroGLYCERIN (NITROSTAT) 0.4 MG SL tablet Place 1 tablet (0.4 mg total) under the tongue every 5 (five) minutes as needed for chest pain.  25 tablet  3    BP 140/80  Pulse 84  Temp 98.4 F (36.9 C) (Oral)  Resp 20  Wt 132 lb (59.875 kg)  SpO2 97%      Review of Systems  Constitutional: Positive for fatigue.  HENT: Positive for congestion. Negative for hearing loss, sore throat, rhinorrhea, dental problem, sinus pressure and tinnitus.   Eyes: Negative for pain, discharge and visual disturbance.  Respiratory: Positive for cough. Negative for shortness of breath.   Cardiovascular: Negative for chest pain, palpitations and leg swelling.  Gastrointestinal: Negative for nausea, vomiting, abdominal pain, diarrhea, constipation, blood  in stool and abdominal distention.  Genitourinary: Negative for dysuria, urgency, frequency, hematuria, flank pain, vaginal bleeding, vaginal discharge, difficulty urinating, vaginal pain and pelvic pain.  Musculoskeletal: Negative for joint swelling, arthralgias and gait problem.  Skin: Negative for rash.  Neurological: Negative for dizziness, syncope, speech difficulty, weakness, numbness and headaches.  Hematological: Negative for adenopathy.  Psychiatric/Behavioral: Negative for behavioral problems, dysphoric mood and agitation. The patient is not nervous/anxious.        Objective:   Physical Exam  Constitutional: She is oriented to person, place, and time. She appears well-developed and well-nourished.  HENT:  Head: Normocephalic.  Right Ear: External ear normal.  Left Ear: External ear normal.  Mouth/Throat: Oropharynx is clear and moist.  Eyes: Conjunctivae normal and EOM are normal. Pupils are equal, round, and reactive to light.  Neck: Normal range of motion. Neck supple. No thyromegaly present.  Cardiovascular: Normal rate, regular rhythm, normal heart sounds and intact distal pulses.   Pulmonary/Chest: Effort normal.       Scattered coarse rhonchi O2 saturation 97  Abdominal: Soft. Bowel sounds are normal. She exhibits no mass. There is no tenderness.  Musculoskeletal: Normal range of motion.  Lymphadenopathy:    She has no cervical adenopathy.  Neurological: She is alert and oriented to person, place, and time.  Skin: Skin is warm and dry. No rash noted.  Psychiatric: She has a normal mood and affect. Her behavior is normal.          Assessment & Plan:   AECOPD-  Will continue expectiorants, and treat with azithromycin Smoking cessation encouraged  HTN  CAD  Colonic polyp- for elective resection

## 2012-12-11 ENCOUNTER — Telehealth (INDEPENDENT_AMBULATORY_CARE_PROVIDER_SITE_OTHER): Payer: Self-pay

## 2012-12-11 NOTE — Telephone Encounter (Signed)
Pt is on antibiotics with a severe chest cold.  I told her to wait until she is fully recovered before rescheduling her CT scan.  Dr. Donell Beers will be made aware.

## 2012-12-15 NOTE — Telephone Encounter (Signed)
Dr Excell Seltzer did addendum to 08/14/12 office note in regards to surgical clearance.  Note faxed to [phone].

## 2012-12-16 ENCOUNTER — Encounter (HOSPITAL_COMMUNITY): Payer: Self-pay | Admitting: Pharmacy Technician

## 2012-12-17 ENCOUNTER — Ambulatory Visit (INDEPENDENT_AMBULATORY_CARE_PROVIDER_SITE_OTHER): Payer: Medicare Other | Admitting: Internal Medicine

## 2012-12-17 ENCOUNTER — Encounter: Payer: Self-pay | Admitting: Internal Medicine

## 2012-12-17 VITALS — BP 160/90 | HR 76 | Temp 98.0°F | Resp 20 | Wt 133.0 lb

## 2012-12-17 DIAGNOSIS — I10 Essential (primary) hypertension: Secondary | ICD-10-CM

## 2012-12-17 DIAGNOSIS — D126 Benign neoplasm of colon, unspecified: Secondary | ICD-10-CM | POA: Diagnosis not present

## 2012-12-17 DIAGNOSIS — Z720 Tobacco use: Secondary | ICD-10-CM

## 2012-12-17 DIAGNOSIS — F17200 Nicotine dependence, unspecified, uncomplicated: Secondary | ICD-10-CM

## 2012-12-17 MED ORDER — MOMETASONE FURO-FORMOTEROL FUM 200-5 MCG/ACT IN AERO: 2.0000 | INHALATION_SPRAY | Freq: Two times a day (BID) | RESPIRATORY_TRACT | Status: DC

## 2012-12-17 NOTE — Patient Instructions (Addendum)
Mucinex DM twice daily  Drink as much fluid as you  can tolerate over the next few days  Smoking tobacco is very bad for your health. You should stop smoking immediately.  Substitute Benicar for lisinopril for the next 4 weeks

## 2012-12-17 NOTE — Progress Notes (Signed)
Subjective:    Patient ID: [name], female    DOB: 05/11/1941, 72 y.o.   MRN: [id number]  HPI  72 year old patient who has a history of ongoing tobacco use. She was seen about one week ago and treated for acute bronchitis. She has been scheduled for elective partial colectomy do to a nonresectable polyp. She continues to have a cough but continues to smoke about one half pack daily. She describes some thick sputum production but no fever or purulent sputum production. No wheezing or shortness of breath. She was given samples of Duler last visit with improvement.  Past Medical History  Diagnosis Date  . Coronary atherosclerosis of unspecified type of vessel, native or graft   . Other and unspecified hyperlipidemia   . Unspecified essential hypertension   . Backache, unspecified   . Raynaud's syndrome   . Personal history of other diseases of digestive system   . Diverticulosis of colon (without mention of hemorrhage)   . Disorder of bone and cartilage, unspecified   . Diverticulosis of colon (without mention of hemorrhage)   . Hemorrhage of gastrointestinal tract, unspecified   . Unspecified hypothyroidism   . 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 Urinary tract infection, site not specified   . Urinary frequency   . Personal history of colonic polyps   . Hypothyroidism   . Anxiety state, unspecified     no per pt  . Arthritis   . Blood transfusion without reported diagnosis   . Myocardial infarction     2009  . Ulcer     History   Social History  . Marital Status: Married    Spouse Name: N/A    Number of Children: N/A  . 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 Smoking status: Current Every Day Smoker -- 0.5 packs/day  . Smokeless tobacco: Never Used  . Alcohol Use: No  . Drug Use: No  . Sexually Active: Not on file   Other Topics Concern  . Not on file   Social History  Narrative  . No narrative on file    Past Surgical History  Procedure Date  . Coronary angioplasty with stent placement     x1  . Gastrointestinal stomal tumor resection   . Cholecystectomy   . Tubal ligation   . Abdominal hysterectomy   . Appendectomy     Family History  Problem Relation Age of Onset  . Heart disease Mother     Arta Bruce  . Lung cancer Father     SISTER, UNCLE  . Cerebral aneurysm Sister     UNCLE NEPHEW  . Colon cancer Neg Hx   . Esophageal cancer Neg Hx   . Stomach cancer Neg Hx   . Rectal cancer Neg Hx     No Known Allergies  Current Outpatient Prescriptions on File Prior to Visit  Medication Sig Dispense Refill  . aspirin 81 MG tablet Take 81 mg by mouth daily.        Marland Kitchen ibuprofen (ADVIL,MOTRIN) 200 MG tablet Take 200 mg by mouth every 6 (six) hours as needed. pain      . levothyroxine (SYNTHROID, LEVOTHROID) 100 MCG tablet Take 1 tablet (100 mcg total) by mouth daily.  90 tablet  3  . lisinopril (PRINIVIL,ZESTRIL) 20 MG tablet TAKE ONE TABLET BY MOUTH ONE TIME DAILY  90 tablet  1  . metoprolol  succinate (TOPROL-XL) 50 MG 24 hr tablet TAKE ONE TABLET BY MOUTH ONE TIME DAILY  90 tablet  3  . nitroGLYCERIN (NITROSTAT) 0.4 MG SL tablet Place 1 tablet (0.4 mg total) under the tongue every 5 (five) minutes as needed for chest pain.  25 tablet  3    BP 160/90  Pulse 76  Temp 98 F (36.7 C) (Oral)  Resp 20  Wt 133 lb (60.328 kg)  SpO2 96%       Review of Systems  Constitutional: Negative.   HENT: Negative for hearing loss, congestion, sore throat, rhinorrhea, dental problem, sinus pressure and tinnitus.   Eyes: Negative for pain, discharge and visual disturbance.  Respiratory: Positive for cough. Negative for shortness of breath.   Cardiovascular: Negative for chest pain, palpitations and leg swelling.  Gastrointestinal: Negative for nausea, vomiting, abdominal pain, diarrhea, constipation, blood in stool and abdominal distention.    Genitourinary: Negative for dysuria, urgency, frequency, hematuria, flank pain, vaginal bleeding, vaginal discharge, difficulty urinating, vaginal pain and pelvic pain.  Musculoskeletal: Negative for joint swelling, arthralgias and gait problem.  Skin: Negative for rash.  Neurological: Negative for dizziness, syncope, speech difficulty, weakness, numbness and headaches.  Hematological: Negative for adenopathy.  Psychiatric/Behavioral: Negative for behavioral problems, dysphoric mood and agitation. The patient is not nervous/anxious.        Objective:   Physical Exam  Constitutional: She is oriented to person, place, and time. She appears well-developed and well-nourished.  HENT:  Head: Normocephalic.  Right Ear: External ear normal.  Left Ear: External ear normal.  Mouth/Throat: Oropharynx is clear and moist.  Eyes: Conjunctivae normal and EOM are normal. Pupils are equal, round, and reactive to light.  Neck: Normal range of motion. Neck supple. No thyromegaly present.  Cardiovascular: Normal rate, regular rhythm, normal heart sounds and intact distal pulses.   Pulmonary/Chest: Effort normal and breath sounds normal. No respiratory distress. She has no wheezes. She has no rales.       O2 saturation 96%  Abdominal: Soft. Bowel sounds are normal. She exhibits no mass. There is no tenderness.  Musculoskeletal: Normal range of motion.  Lymphadenopathy:    She has no cervical adenopathy.  Neurological: She is alert and oriented to person, place, and time.  Skin: Skin is warm and dry. No rash noted.  Psychiatric: She has a normal mood and affect. Her behavior is normal.          Assessment & Plan:    ongoing tobacco use resolving bronchitis Hypertension   Total cessation of tobacco use encouraged We'll continue DULERA twice daily as well as expectorants

## 2012-12-19 ENCOUNTER — Encounter (INDEPENDENT_AMBULATORY_CARE_PROVIDER_SITE_OTHER): Payer: Self-pay

## 2012-12-19 ENCOUNTER — Other Ambulatory Visit (INDEPENDENT_AMBULATORY_CARE_PROVIDER_SITE_OTHER): Payer: Self-pay | Admitting: General Surgery

## 2012-12-19 ENCOUNTER — Ambulatory Visit
Admission: RE | Admit: 2012-12-19 | Discharge: 2012-12-19 | Disposition: A | Discharge status: Home / Self Care | Payer: Medicare Other | Source: Ambulatory Visit | Attending: General Surgery | Admitting: General Surgery

## 2012-12-19 DIAGNOSIS — Z8509 Personal history of malignant neoplasm of other digestive organs: Secondary | ICD-10-CM | POA: Diagnosis not present

## 2012-12-19 DIAGNOSIS — R1031 Right lower quadrant pain: Secondary | ICD-10-CM | POA: Diagnosis not present

## 2012-12-19 MED ORDER — IOHEXOL 300 MG/ML  SOLN
100.0000 mL | Freq: Once | INTRAMUSCULAR | Status: AC | PRN
  Administered 2012-12-19: 100 mL via INTRAVENOUS

## 2012-12-22 ENCOUNTER — Encounter (HOSPITAL_COMMUNITY): Payer: Self-pay

## 2012-12-22 ENCOUNTER — Encounter (HOSPITAL_COMMUNITY)
Admission: RE | Admit: 2012-12-22 | Discharge: 2012-12-22 | Disposition: A | Discharge status: Home / Self Care | Payer: Medicare Other | Source: Ambulatory Visit | Attending: General Surgery | Admitting: General Surgery

## 2012-12-22 ENCOUNTER — Ambulatory Visit (HOSPITAL_COMMUNITY)
Admission: RE | Admit: 2012-12-22 | Discharge: 2012-12-22 | Disposition: A | Discharge status: Home / Self Care | Payer: Medicare Other | Source: Ambulatory Visit | Attending: Anesthesiology | Admitting: Anesthesiology

## 2012-12-22 DIAGNOSIS — I10 Essential (primary) hypertension: Secondary | ICD-10-CM | POA: Insufficient documentation

## 2012-12-22 DIAGNOSIS — J4489 Other specified chronic obstructive pulmonary disease: Secondary | ICD-10-CM | POA: Insufficient documentation

## 2012-12-22 DIAGNOSIS — Z01818 Encounter for other preprocedural examination: Secondary | ICD-10-CM | POA: Insufficient documentation

## 2012-12-22 DIAGNOSIS — J449 Chronic obstructive pulmonary disease, unspecified: Secondary | ICD-10-CM | POA: Insufficient documentation

## 2012-12-22 HISTORY — DX: Diverticulitis of intestine, part unspecified, without perforation or abscess without bleeding: K57.92

## 2012-12-22 HISTORY — DX: Dorsalgia, unspecified: M54.9

## 2012-12-22 HISTORY — DX: Other chronic pain: G89.29

## 2012-12-22 HISTORY — DX: Anemia, unspecified: D64.9

## 2012-12-22 HISTORY — DX: Bronchitis, not specified as acute or chronic: J40

## 2012-12-22 LAB — CBC
HCT: 46.3 % — ABNORMAL HIGH (ref 36.0–46.0)
Platelets: 244 10*3/uL (ref 150–400)
RBC: 4.71 MIL/uL (ref 3.87–5.11)
RDW: 14.2 % (ref 11.5–15.5)
WBC: 8.1 10*3/uL (ref 4.0–10.5)

## 2012-12-22 LAB — TYPE AND SCREEN
ABO/RH(D): A POS
Antibody Screen: NEGATIVE

## 2012-12-22 LAB — BASIC METABOLIC PANEL
Chloride: 102 mEq/L (ref 96–112)
Creatinine, Ser: 1.09 mg/dL (ref 0.50–1.10)
GFR calc Af Amer: 58 mL/min — ABNORMAL LOW (ref >90)
Sodium: 140 mEq/L (ref 135–145)

## 2012-12-22 LAB — SURGICAL PCR SCREEN: Staphylococcus aureus: NEGATIVE

## 2012-12-22 MED ORDER — DEXTROSE 5 % IV SOLN
2.0000 g | Freq: Once | INTRAVENOUS | Status: AC
  Administered 2012-12-23: 2 g via INTRAVENOUS
  Filled 2012-12-22: qty 2

## 2012-12-22 MED ORDER — ALVIMOPAN 12 MG PO CAPS
12.0000 mg | ORAL_CAPSULE | Freq: Once | ORAL | Status: AC
  Administered 2012-12-23: 12 mg via ORAL
  Filled 2012-12-22: qty 1

## 2012-12-22 NOTE — Pre-Procedure Instructions (Signed)
[name]  12/22/2012   Your procedure is scheduled on:  Tuesday December 23, 2012  Report to [facility] at 9:00 AM.  Call this number if you have problems the morning of surgery: [phone]   Remember:   Do not eat food or drink liquids after midnight.   Take these medicines the morning of surgery with A SIP OF WATER: synthroid, metoprolol, dulera, nitroglycerin (if needed)   Do not wear jewelry, make-up or nail polish.  Do not wear lotions, powders, or perfumes.  Do not shave 48 hours prior to surgery.  Do not bring valuables to the hospital.  Contacts, dentures or bridgework may not be worn into surgery.  Leave suitcase in the car. After surgery it may be brought to your room.  For patients admitted to the hospital, checkout time is 11:00 AM the day of  discharge.   Patients discharged the day of surgery will not be allowed to drive  home.  Name and phone number of your driver: family / friend  Special Instructions: Shower using CHG 2 nights before surgery and the night before surgery.  If you shower the day of surgery use CHG.  Use special wash - you have one bottle of CHG for all showers.  You should use approximately 1/3 of the bottle for each shower.   Please read over the following fact sheets that you were given: Pain Booklet, Coughing and Deep Breathing, Blood Transfusion Information, MRSA Information and Surgical Site Infection Prevention

## 2012-12-23 ENCOUNTER — Inpatient Hospital Stay (HOSPITAL_COMMUNITY)
Admission: RE | Admit: 2012-12-23 | Discharge: 2012-12-28 | DRG: 331 | Disposition: A | Discharge status: Home / Self Care | Payer: Medicare Other | Source: Ambulatory Visit | Attending: General Surgery | Admitting: General Surgery

## 2012-12-23 ENCOUNTER — Encounter (HOSPITAL_COMMUNITY)
Admission: RE | Disposition: A | Discharge status: Home / Self Care | Payer: Self-pay | Source: Ambulatory Visit | Attending: General Surgery

## 2012-12-23 ENCOUNTER — Encounter (HOSPITAL_COMMUNITY): Payer: Self-pay | Admitting: *Deleted

## 2012-12-23 ENCOUNTER — Inpatient Hospital Stay (HOSPITAL_COMMUNITY): Payer: Medicare Other | Admitting: Certified Registered Nurse Anesthetist

## 2012-12-23 ENCOUNTER — Encounter (HOSPITAL_COMMUNITY): Payer: Self-pay | Admitting: Certified Registered Nurse Anesthetist

## 2012-12-23 ENCOUNTER — Encounter (HOSPITAL_COMMUNITY): Payer: Self-pay | Admitting: General Surgery

## 2012-12-23 ENCOUNTER — Encounter (HOSPITAL_COMMUNITY): Payer: Self-pay | Admitting: General Practice

## 2012-12-23 DIAGNOSIS — I252 Old myocardial infarction: Secondary | ICD-10-CM

## 2012-12-23 DIAGNOSIS — Z860100 Personal history of colon polyps, unspecified: Secondary | ICD-10-CM

## 2012-12-23 DIAGNOSIS — Z01818 Encounter for other preprocedural examination: Secondary | ICD-10-CM

## 2012-12-23 DIAGNOSIS — I7300 Raynaud's syndrome without gangrene: Secondary | ICD-10-CM | POA: Diagnosis present

## 2012-12-23 DIAGNOSIS — D126 Benign neoplasm of colon, unspecified: Secondary | ICD-10-CM | POA: Diagnosis not present

## 2012-12-23 DIAGNOSIS — I498 Other specified cardiac arrhythmias: Secondary | ICD-10-CM | POA: Diagnosis not present

## 2012-12-23 DIAGNOSIS — J4489 Other specified chronic obstructive pulmonary disease: Secondary | ICD-10-CM | POA: Diagnosis not present

## 2012-12-23 DIAGNOSIS — I10 Essential (primary) hypertension: Secondary | ICD-10-CM | POA: Diagnosis present

## 2012-12-23 DIAGNOSIS — Z79899 Other long term (current) drug therapy: Secondary | ICD-10-CM | POA: Diagnosis not present

## 2012-12-23 DIAGNOSIS — Z7982 Long term (current) use of aspirin: Secondary | ICD-10-CM | POA: Diagnosis not present

## 2012-12-23 DIAGNOSIS — E039 Hypothyroidism, unspecified: Secondary | ICD-10-CM | POA: Diagnosis present

## 2012-12-23 DIAGNOSIS — F17200 Nicotine dependence, unspecified, uncomplicated: Secondary | ICD-10-CM | POA: Diagnosis present

## 2012-12-23 DIAGNOSIS — I2510 Atherosclerotic heart disease of native coronary artery without angina pectoris: Secondary | ICD-10-CM | POA: Diagnosis present

## 2012-12-23 DIAGNOSIS — J449 Chronic obstructive pulmonary disease, unspecified: Secondary | ICD-10-CM | POA: Diagnosis present

## 2012-12-23 DIAGNOSIS — M549 Dorsalgia, unspecified: Secondary | ICD-10-CM | POA: Diagnosis not present

## 2012-12-23 DIAGNOSIS — M129 Arthropathy, unspecified: Secondary | ICD-10-CM | POA: Diagnosis present

## 2012-12-23 DIAGNOSIS — Z800 Family history of malignant neoplasm of digestive organs: Secondary | ICD-10-CM | POA: Diagnosis not present

## 2012-12-23 DIAGNOSIS — E785 Hyperlipidemia, unspecified: Secondary | ICD-10-CM | POA: Diagnosis present

## 2012-12-23 DIAGNOSIS — K5732 Diverticulitis of large intestine without perforation or abscess without bleeding: Secondary | ICD-10-CM | POA: Diagnosis not present

## 2012-12-23 DIAGNOSIS — D378 Neoplasm of uncertain behavior of other specified digestive organs: Secondary | ICD-10-CM | POA: Diagnosis not present

## 2012-12-23 DIAGNOSIS — Z9861 Coronary angioplasty status: Secondary | ICD-10-CM | POA: Diagnosis not present

## 2012-12-23 DIAGNOSIS — D360 Benign neoplasm of lymph nodes: Secondary | ICD-10-CM | POA: Diagnosis not present

## 2012-12-23 HISTORY — PX: LAPAROSCOPIC PARTIAL COLECTOMY: SHX5907

## 2012-12-23 HISTORY — PX: PARTIAL COLECTOMY: SHX5273

## 2012-12-23 LAB — CBC
Hemoglobin: 12.7 g/dL (ref 12.0–15.0)
MCHC: 32.2 g/dL (ref 30.0–36.0)

## 2012-12-23 LAB — CREATININE, SERUM
Creatinine, Ser: 0.98 mg/dL (ref 0.50–1.10)
GFR calc non Af Amer: 57 mL/min — ABNORMAL LOW (ref >90)

## 2012-12-23 SURGERY — LAPAROSCOPIC PARTIAL COLECTOMY
Anesthesia: General | Site: Abdomen | Wound class: Clean Contaminated

## 2012-12-23 MED ORDER — DIPHENHYDRAMINE HCL 50 MG/ML IJ SOLN: 12.5000 mg | Freq: Four times a day (QID) | INTRAMUSCULAR | Status: DC | PRN

## 2012-12-23 MED ORDER — ROCURONIUM BROMIDE 100 MG/10ML IV SOLN
INTRAVENOUS | Status: DC | PRN
  Administered 2012-12-23: 30 mg via INTRAVENOUS
  Administered 2012-12-23 (×2): 10 mg via INTRAVENOUS

## 2012-12-23 MED ORDER — HYDROMORPHONE HCL PF 1 MG/ML IJ SOLN
INTRAMUSCULAR | Status: AC
  Filled 2012-12-23: qty 1

## 2012-12-23 MED ORDER — PROPOFOL 10 MG/ML IV BOLUS
INTRAVENOUS | Status: DC | PRN
  Administered 2012-12-23: 150 mg via INTRAVENOUS

## 2012-12-23 MED ORDER — SODIUM CHLORIDE 0.9 % IV SOLN
10.0000 mg | INTRAVENOUS | Status: DC | PRN
  Administered 2012-12-23: 25 ug/min via INTRAVENOUS

## 2012-12-23 MED ORDER — HYDROMORPHONE HCL PF 1 MG/ML IJ SOLN
0.2500 mg | INTRAMUSCULAR | Status: DC | PRN
  Administered 2012-12-23: 0.25 mg via INTRAVENOUS

## 2012-12-23 MED ORDER — MORPHINE SULFATE 2 MG/ML IJ SOLN: 1.0000 mg | INTRAMUSCULAR | Status: DC | PRN

## 2012-12-23 MED ORDER — MORPHINE SULFATE (PF) 1 MG/ML IV SOLN
INTRAVENOUS | Status: AC
  Filled 2012-12-23: qty 25

## 2012-12-23 MED ORDER — ONDANSETRON HCL 4 MG/2ML IJ SOLN: 4.0000 mg | Freq: Four times a day (QID) | INTRAMUSCULAR | Status: DC | PRN

## 2012-12-23 MED ORDER — ONDANSETRON HCL 4 MG/2ML IJ SOLN
INTRAMUSCULAR | Status: DC | PRN
  Administered 2012-12-23: 4 mg via INTRAVENOUS

## 2012-12-23 MED ORDER — NITROGLYCERIN 0.4 MG SL SUBL: 0.4000 mg | SUBLINGUAL_TABLET | SUBLINGUAL | Status: DC | PRN

## 2012-12-23 MED ORDER — LACTATED RINGERS IV SOLN
INTRAVENOUS | Status: DC | PRN
  Administered 2012-12-23 (×3): via INTRAVENOUS

## 2012-12-23 MED ORDER — LEVOTHYROXINE SODIUM 100 MCG PO TABS
100.0000 ug | ORAL_TABLET | Freq: Every day | ORAL | Status: DC
  Administered 2012-12-24 – 2012-12-28 (×5): 100 ug via ORAL
  Filled 2012-12-23 (×7): qty 1

## 2012-12-23 MED ORDER — METOPROLOL SUCCINATE ER 50 MG PO TB24
50.0000 mg | ORAL_TABLET | Freq: Once | ORAL | Status: AC
  Administered 2012-12-23: 50 mg via ORAL
  Filled 2012-12-23: qty 1

## 2012-12-23 MED ORDER — GLYCOPYRROLATE 0.2 MG/ML IJ SOLN
INTRAMUSCULAR | Status: DC | PRN
  Administered 2012-12-23: .8 mg via INTRAVENOUS

## 2012-12-23 MED ORDER — BUPIVACAINE-EPINEPHRINE 0.25% -1:200000 IJ SOLN
INTRAMUSCULAR | Status: AC
  Filled 2012-12-23: qty 1

## 2012-12-23 MED ORDER — NALOXONE HCL 0.4 MG/ML IJ SOLN: 0.4000 mg | INTRAMUSCULAR | Status: DC | PRN

## 2012-12-23 MED ORDER — BUPIVACAINE 0.25 % ON-Q PUMP DUAL CATH 300 ML
300.0000 mL | INJECTION | Status: AC
  Filled 2012-12-23: qty 300

## 2012-12-23 MED ORDER — NEOSTIGMINE METHYLSULFATE 1 MG/ML IJ SOLN
INTRAMUSCULAR | Status: DC | PRN
  Administered 2012-12-23: 5 mg via INTRAVENOUS

## 2012-12-23 MED ORDER — CHLORHEXIDINE GLUCONATE 0.12 % MT SOLN
15.0000 mL | Freq: Two times a day (BID) | OROMUCOSAL | Status: DC
  Administered 2012-12-23 – 2012-12-26 (×7): 15 mL via OROMUCOSAL
  Filled 2012-12-23 (×6): qty 15

## 2012-12-23 MED ORDER — LIDOCAINE HCL (PF) 1 % IJ SOLN
INTRAMUSCULAR | Status: AC
  Filled 2012-12-23: qty 30

## 2012-12-23 MED ORDER — BIOTENE DRY MOUTH MT LIQD
15.0000 mL | Freq: Two times a day (BID) | OROMUCOSAL | Status: DC
  Administered 2012-12-24 – 2012-12-26 (×5): 15 mL via OROMUCOSAL

## 2012-12-23 MED ORDER — LISINOPRIL 20 MG PO TABS
20.0000 mg | ORAL_TABLET | Freq: Every day | ORAL | Status: DC
  Administered 2012-12-25: 20 mg via ORAL
  Filled 2012-12-23 (×2): qty 1

## 2012-12-23 MED ORDER — ARTIFICIAL TEARS OP OINT: TOPICAL_OINTMENT | OPHTHALMIC | Status: DC | PRN

## 2012-12-23 MED ORDER — OXYCODONE HCL 5 MG PO TABS: 5.0000 mg | ORAL_TABLET | Freq: Once | ORAL | Status: DC | PRN

## 2012-12-23 MED ORDER — ASPIRIN 81 MG PO CHEW
81.0000 mg | CHEWABLE_TABLET | Freq: Every day | ORAL | Status: DC
  Administered 2012-12-24 – 2012-12-28 (×5): 81 mg via ORAL
  Filled 2012-12-23 (×5): qty 1

## 2012-12-23 MED ORDER — ALBUMIN HUMAN 5 % IV SOLN
INTRAVENOUS | Status: DC | PRN
  Administered 2012-12-23 (×2): via INTRAVENOUS

## 2012-12-23 MED ORDER — ACETAMINOPHEN 10 MG/ML IV SOLN
INTRAVENOUS | Status: AC
  Filled 2012-12-23: qty 100

## 2012-12-23 MED ORDER — DEXTROSE 5 % IV SOLN
1.0000 g | Freq: Two times a day (BID) | INTRAVENOUS | Status: AC
  Administered 2012-12-24 (×2): 1 g via INTRAVENOUS
  Filled 2012-12-23 (×2): qty 1

## 2012-12-23 MED ORDER — EPHEDRINE SULFATE 50 MG/ML IJ SOLN
INTRAMUSCULAR | Status: DC | PRN
  Administered 2012-12-23: 10 mg via INTRAVENOUS
  Administered 2012-12-23: 5 mg via INTRAVENOUS

## 2012-12-23 MED ORDER — FENTANYL CITRATE 0.05 MG/ML IJ SOLN
INTRAMUSCULAR | Status: DC | PRN
  Administered 2012-12-23: 50 ug via INTRAVENOUS
  Administered 2012-12-23: 150 ug via INTRAVENOUS
  Administered 2012-12-23 (×2): 50 ug via INTRAVENOUS

## 2012-12-23 MED ORDER — LACTATED RINGERS IV SOLN
INTRAVENOUS | Status: DC
  Administered 2012-12-23: 10:00:00 via INTRAVENOUS

## 2012-12-23 MED ORDER — OXYCODONE HCL 5 MG/5ML PO SOLN: 5.0000 mg | Freq: Once | ORAL | Status: DC | PRN

## 2012-12-23 MED ORDER — MOMETASONE FURO-FORMOTEROL FUM 200-5 MCG/ACT IN AERO
2.0000 | INHALATION_SPRAY | Freq: Two times a day (BID) | RESPIRATORY_TRACT | Status: DC
  Administered 2012-12-23 – 2012-12-28 (×10): 2 via RESPIRATORY_TRACT
  Filled 2012-12-23: qty 8.8

## 2012-12-23 MED ORDER — METOPROLOL SUCCINATE ER 25 MG PO TB24
25.0000 mg | ORAL_TABLET | Freq: Every day | ORAL | Status: DC
  Filled 2012-12-23 (×2): qty 1

## 2012-12-23 MED ORDER — PHENYLEPHRINE HCL 10 MG/ML IJ SOLN
INTRAMUSCULAR | Status: DC | PRN
  Administered 2012-12-23: 40 ug via INTRAVENOUS
  Administered 2012-12-23: 80 ug via INTRAVENOUS
  Administered 2012-12-23 (×2): 40 ug via INTRAVENOUS
  Administered 2012-12-23: 20 ug via INTRAVENOUS

## 2012-12-23 MED ORDER — ONDANSETRON HCL 4 MG PO TABS: 4.0000 mg | ORAL_TABLET | Freq: Four times a day (QID) | ORAL | Status: DC | PRN

## 2012-12-23 MED ORDER — LIDOCAINE HCL 1 % IJ SOLN
INTRAMUSCULAR | Status: DC | PRN
  Administered 2012-12-23: 11:00:00 via INTRAMUSCULAR

## 2012-12-23 MED ORDER — MORPHINE SULFATE (PF) 1 MG/ML IV SOLN
INTRAVENOUS | Status: DC
  Administered 2012-12-23 – 2012-12-24 (×3): 1 mg via INTRAVENOUS

## 2012-12-23 MED ORDER — BUPIVACAINE 0.25 % ON-Q PUMP DUAL CATH 300 ML
INJECTION | Status: DC | PRN
  Administered 2012-12-23: 300 mL

## 2012-12-23 MED ORDER — LIDOCAINE HCL (CARDIAC) 20 MG/ML IV SOLN
INTRAVENOUS | Status: DC | PRN
  Administered 2012-12-23: 60 mg via INTRAVENOUS

## 2012-12-23 MED ORDER — MIDAZOLAM HCL 5 MG/5ML IJ SOLN
INTRAMUSCULAR | Status: DC | PRN
  Administered 2012-12-23: 2 mg via INTRAVENOUS

## 2012-12-23 MED ORDER — KCL IN DEXTROSE-NACL 20-5-0.45 MEQ/L-%-% IV SOLN
INTRAVENOUS | Status: DC
  Administered 2012-12-23 – 2012-12-24 (×3): via INTRAVENOUS
  Administered 2012-12-25: 100 mL via INTRAVENOUS
  Administered 2012-12-25 – 2012-12-26 (×2): 1000 mL via INTRAVENOUS
  Filled 2012-12-23 (×9): qty 1000

## 2012-12-23 MED ORDER — 0.9 % SODIUM CHLORIDE (POUR BTL) OPTIME
TOPICAL | Status: DC | PRN
  Administered 2012-12-23 (×2): 1000 mL

## 2012-12-23 MED ORDER — BUPIVACAINE ON-Q PAIN PUMP (FOR ORDER SET NO CHG)
INJECTION | Status: DC
  Filled 2012-12-23: qty 1

## 2012-12-23 MED ORDER — ACETAMINOPHEN 10 MG/ML IV SOLN
1000.0000 mg | Freq: Four times a day (QID) | INTRAVENOUS | Status: AC
  Administered 2012-12-23 – 2012-12-24 (×4): 1000 mg via INTRAVENOUS
  Filled 2012-12-23 (×3): qty 100

## 2012-12-23 MED ORDER — ENOXAPARIN SODIUM 30 MG/0.3ML ~~LOC~~ SOLN
40.0000 mg | SUBCUTANEOUS | Status: DC
  Administered 2012-12-24 – 2012-12-28 (×5): 40 mg via SUBCUTANEOUS
  Filled 2012-12-23 (×5): qty 0.4

## 2012-12-23 MED ORDER — DIPHENHYDRAMINE HCL 12.5 MG/5ML PO ELIX
12.5000 mg | ORAL_SOLUTION | Freq: Four times a day (QID) | ORAL | Status: DC | PRN
  Filled 2012-12-23: qty 5

## 2012-12-23 MED ORDER — SODIUM CHLORIDE 0.9 % IJ SOLN: 9.0000 mL | INTRAMUSCULAR | Status: DC | PRN

## 2012-12-23 SURGICAL SUPPLY — 91 items
APPLIER CLIP ROT 10 11.4 M/L (STAPLE)
BENZOIN TINCTURE PRP APPL 2/3 (GAUZE/BANDAGES/DRESSINGS) ×2 IMPLANT
BLADE SURG 10 STRL SS (BLADE) ×2 IMPLANT
BLADE SURG ROTATE 9660 (MISCELLANEOUS) IMPLANT
CANISTER SUCTION 2500CC (MISCELLANEOUS) ×2 IMPLANT
CATH KIT ON Q 5IN SLV (PAIN MANAGEMENT) ×4 IMPLANT
CELLS DAT CNTRL 66122 CELL SVR (MISCELLANEOUS) IMPLANT
CHLORAPREP W/TINT 26ML (MISCELLANEOUS) ×2 IMPLANT
CLIP APPLIE ROT 10 11.4 M/L (STAPLE) IMPLANT
CLOTH BEACON ORANGE TIMEOUT ST (SAFETY) ×2 IMPLANT
COVER MAYO STAND STRL (DRAPES) ×2 IMPLANT
COVER SURGICAL LIGHT HANDLE (MISCELLANEOUS) ×2 IMPLANT
DECANTER SPIKE VIAL GLASS SM (MISCELLANEOUS) ×4 IMPLANT
DISSECTOR BLUNT TIP ENDO 5MM (MISCELLANEOUS) IMPLANT
DRAPE PROXIMA HALF (DRAPES) IMPLANT
DRAPE UTILITY 15X26 W/TAPE STR (DRAPE) ×6 IMPLANT
DRAPE WARM FLUID 44X44 (DRAPE) ×2 IMPLANT
DRSG MEPILEX BORDER 4X4 (GAUZE/BANDAGES/DRESSINGS) ×2 IMPLANT
ELECT CAUTERY BLADE 6.4 (BLADE) ×4 IMPLANT
ELECT REM PT RETURN 9FT ADLT (ELECTROSURGICAL) ×2
ELECTRODE REM PT RTRN 9FT ADLT (ELECTROSURGICAL) ×1 IMPLANT
GAUZE SPONGE 2X2 8PLY STRL LF (GAUZE/BANDAGES/DRESSINGS) ×1 IMPLANT
GEL ULTRASOUND 20GR AQUASONIC (MISCELLANEOUS) IMPLANT
GLOVE BIO SURGEON STRL SZ 6 (GLOVE) ×4 IMPLANT
GLOVE BIO SURGEON STRL SZ7 (GLOVE) ×2 IMPLANT
GLOVE BIOGEL PI IND STRL 6.5 (GLOVE) ×3 IMPLANT
GLOVE BIOGEL PI IND STRL 7.0 (GLOVE) ×1 IMPLANT
GLOVE BIOGEL PI IND STRL 7.5 (GLOVE) ×3 IMPLANT
GLOVE BIOGEL PI IND STRL 8 (GLOVE) ×2 IMPLANT
GLOVE BIOGEL PI INDICATOR 6.5 (GLOVE) ×3
GLOVE BIOGEL PI INDICATOR 7.0 (GLOVE) ×1
GLOVE BIOGEL PI INDICATOR 7.5 (GLOVE) ×3
GLOVE BIOGEL PI INDICATOR 8 (GLOVE) ×2
GLOVE ECLIPSE 6.5 STRL STRAW (GLOVE) ×4 IMPLANT
GLOVE SS BIOGEL STRL SZ 7.5 (GLOVE) ×2 IMPLANT
GLOVE SUPERSENSE BIOGEL SZ 7.5 (GLOVE) ×2
GOWN PREVENTION PLUS XLARGE (GOWN DISPOSABLE) ×4 IMPLANT
GOWN PREVENTION PLUS XXLARGE (GOWN DISPOSABLE) ×6 IMPLANT
GOWN STRL NON-REIN LRG LVL3 (GOWN DISPOSABLE) ×6 IMPLANT
KIT BASIN OR (CUSTOM PROCEDURE TRAY) ×2 IMPLANT
KIT ROOM TURNOVER OR (KITS) ×2 IMPLANT
LEGGING LITHOTOMY PAIR STRL (DRAPES) IMPLANT
LIGASURE 5MM LAPAROSCOPIC (INSTRUMENTS) IMPLANT
LIGASURE IMPACT 36 18CM CVD LR (INSTRUMENTS) IMPLANT
NS IRRIG 1000ML POUR BTL (IV SOLUTION) ×4 IMPLANT
PAD ARMBOARD 7.5X6 YLW CONV (MISCELLANEOUS) ×4 IMPLANT
PAD SHARPS MAGNETIC DISPOSAL (MISCELLANEOUS) ×2 IMPLANT
PENCIL BUTTON HOLSTER BLD 10FT (ELECTRODE) ×2 IMPLANT
RELOAD PROXIMATE 75MM BLUE (ENDOMECHANICALS) ×4 IMPLANT
RTRCTR WOUND ALEXIS 18CM MED (MISCELLANEOUS)
SCALPEL HARMONIC ACE (MISCELLANEOUS) IMPLANT
SCISSORS LAP 5X35 DISP (ENDOMECHANICALS) ×2 IMPLANT
SEALER TISSUE G2 STRG ARTC 35C (ENDOMECHANICALS) ×2 IMPLANT
SET IRRIG TUBING LAPAROSCOPIC (IRRIGATION / IRRIGATOR) IMPLANT
SLEEVE ENDOPATH XCEL 5M (ENDOMECHANICALS) ×6 IMPLANT
SPECIMEN JAR LARGE (MISCELLANEOUS) IMPLANT
SPONGE GAUZE 2X2 STER 10/PKG (GAUZE/BANDAGES/DRESSINGS) ×1
SPONGE LAP 18X18 X RAY DECT (DISPOSABLE) ×2 IMPLANT
STAPLER GUN LINEAR PROX 60 (STAPLE) ×2 IMPLANT
STAPLER PROXIMATE 75MM BLUE (STAPLE) ×2 IMPLANT
STAPLER VISISTAT 35W (STAPLE) ×2 IMPLANT
SUCTION POOLE TIP (SUCTIONS) ×2 IMPLANT
SURGILUBE 2OZ TUBE FLIPTOP (MISCELLANEOUS) IMPLANT
SUT MNCRL AB 4-0 PS2 18 (SUTURE) ×2 IMPLANT
SUT PDS AB 1 CT  36 (SUTURE)
SUT PDS AB 1 CT 36 (SUTURE) IMPLANT
SUT PDS II 0 TP-1 LOOPED 60 (SUTURE) ×4 IMPLANT
SUT PROLENE 2 0 CT2 30 (SUTURE) IMPLANT
SUT PROLENE 2 0 KS (SUTURE) IMPLANT
SUT SILK 2 0 (SUTURE) ×1
SUT SILK 2 0 SH CR/8 (SUTURE) ×2 IMPLANT
SUT SILK 2-0 18XBRD TIE 12 (SUTURE) ×1 IMPLANT
SUT SILK 3 0 (SUTURE) ×1
SUT SILK 3 0 SH CR/8 (SUTURE) ×2 IMPLANT
SUT SILK 3-0 18XBRD TIE 12 (SUTURE) ×1 IMPLANT
SYR BULB IRRIGATION 50ML (SYRINGE) ×2 IMPLANT
SYS LAPSCP GELPORT 120MM (MISCELLANEOUS) ×2
SYSTEM LAPSCP GELPORT 120MM (MISCELLANEOUS) ×1 IMPLANT
TOWEL OR 17X26 10 PK STRL BLUE (TOWEL DISPOSABLE) ×4 IMPLANT
TRAY FOLEY CATH 14FRSI W/METER (CATHETERS) IMPLANT
TRAY LAPAROSCOPIC (CUSTOM PROCEDURE TRAY) ×2 IMPLANT
TRAY PROCTOSCOPIC FIBER OPTIC (SET/KITS/TRAYS/PACK) IMPLANT
TROCAR XCEL 12X100 BLDLESS (ENDOMECHANICALS) IMPLANT
TROCAR XCEL BLUNT TIP 100MML (ENDOMECHANICALS) ×2 IMPLANT
TROCAR XCEL NON-BLD 11X100MML (ENDOMECHANICALS) IMPLANT
TROCAR XCEL NON-BLD 5MMX100MML (ENDOMECHANICALS) ×2 IMPLANT
TUBE CONNECTING 12X1/4 (SUCTIONS) ×2 IMPLANT
TUBING FILTER THERMOFLATOR (ELECTROSURGICAL) ×2 IMPLANT
TUNNELER SHEATH ON-Q 16GX12 DP (PAIN MANAGEMENT) ×2 IMPLANT
WATER STERILE IRR 1000ML POUR (IV SOLUTION) ×2 IMPLANT
YANKAUER SUCT BULB TIP NO VENT (SUCTIONS) ×4 IMPLANT

## 2012-12-23 NOTE — Progress Notes (Signed)
Called pharmacy for [facility]

## 2012-12-23 NOTE — Transfer of Care (Signed)
Immediate Anesthesia Transfer of Care Note  Patient: [name]  Procedure(s) Performed: Procedure(s) (LRB) with comments: LAPAROSCOPIC PARTIAL COLECTOMY (N/A)  Patient Location: PACU  Anesthesia Type:General  Level of Consciousness: awake, alert  and oriented  Airway & Oxygen Therapy: Patient Spontanous Breathing and Patient connected to face mask oxygen  Post-op Assessment: Report given to PACU RN, Post -op Vital signs reviewed and stable and Patient moving all extremities X 4  Post vital signs: Reviewed and stable  Complications: No apparent anesthesia complications

## 2012-12-23 NOTE — Op Note (Signed)
Laparoscopic Right Hemicolectomy Procedure Note  Indications: This patient presents for a laparoscopic partial colectomy for recurrent serrated adenoma hepatic flexure  Pre-operative Diagnosis:  Serrated adenoma  Post-operative Diagnosis:  Same  Surgeon: Almond Lint   Assistants: Jaclynn Guarneri   Anesthesia: General endotracheal anesthesia and Local anesthesia 1% buffered lidocaine, 0.25.% bupivacaine, with epinephrine  ASA Class: 3  Procedure Details  The patient was seen in the Holding Room. The risks, benefits, complications, treatment options, and expected outcomes were discussed with the patient. The possibilities of reaction to medication, perforation of viscus, bleeding, recurrent infection, finding a normal colon, the need for additional procedures, failure to diagnose a condition, and creating a complication requiring transfusion or operation were discussed with the patient. The patient was advised of the risk of ostomy.  The patient concurred with the proposed plan, giving informed consent.   The patient was taken to the operating room, identified, and the procedure verified as partial colectomy. A Time Out was held and the above information confirmed.  The patient was brought to the operating room and placed supine. After induction of a general anesthetic, a Foley catheter was inserted and the abdomen was prepped and draped in standard fashion. The patient was placed into reverse the lumbar position and rotated to the right. A small incision at the costal margin was made with a #11 blade. Local anesthetic was introduced.  The 5 mm Optiview trocar was placed under direct visualization.  Pneumoperitoneum was insufflated to a pressure of 15 mm Hg. The laparoscope was introduced.    Exploration revealed numerous adhesions to the abdominal wall.  The visualized structures were grossly normal including omentum, colon, small bowel, peritoneum, liver, and stomach. Two 5 mm left sided 5-mm  trocars were then placed after anesthetizing the skin and peritoneum with local anesthetic. The adhesions were taken down sharply with scissors.  An additional 5 mm port was placed in the RLQ.  The ascending colon and hepatic flexure were then mobilized with gentle retraction of the colon in a medial direction with mobilization of the peritoneal reflection with cautery and the EnSeal.  The omentum was taken off the transverse colon. Mobilization of this area was complete to expose the retroperitoneum.  There was minimal blood loss during this portion of the procedure.      The colon was evaluated to make sure it was adequately mobilized.  A 6 cm vertical incision was made above the umbilicus.  The subcutaneous tissues were divided with the cautery.  The fascia was the cautery as well. The fascial incision was carried out point the skin incision. The protractor was placed and the abdomen to protect the skin. The colon was delivered through the incision.  The colon was resected with a linear stapling device proximal and distal to the area in question in regard to the specimen. The mesenteric vessels were clamped and ligated. The tissues were extremely friable and several areas required suture ligation.The specimen was submitted to pathology.      An side-to-side, functional end-to-end anastomosis was performed through the small anterior incision with the linear stapling device. The mucosa was inspected and found to be hemostatic. Closure was achieved with the linear stapling device. A 3-0 vicryl suture was used to reapproximate the angle of the anastomosis. Hemostasis was confirmed. The bowel anastomosis was returned.  Gowns and gloves were changed.   The abdomen was irrigated.    The OnQ tunneler was placed on both sides of the fascial incision. The fascial incision was  then closed with a running 0 looped PDS suture. The incisions were closed with 4-0 monocryl. The OnQ catheters were advanced through the wound.   Soft dressings were applied.  Instrument, sponge, and needle counts were correct prior to abdominal closure and at the conclusion of the case.   Findings: tattoo at hepatic flexure.  No palpable lesion  Estimated Blood Loss: 25 mL         Drains: none  Specimens: right colon            Complications: None; patient tolerated the procedure well.         Disposition: PACU - hemodynamically stable.

## 2012-12-23 NOTE — Anesthesia Preprocedure Evaluation (Signed)
Anesthesia Evaluation  Patient identified by MRN, date of birth, ID band Patient awake    Reviewed: Allergy & Precautions, H&P , NPO status , Patient's Chart, lab work & pertinent test results  Airway Mallampati: II TM Distance: >3 FB Neck ROM: Full    Dental No notable dental hx. (+) Edentulous Upper, Edentulous Lower and Dental Advisory Given   Pulmonary neg pulmonary ROS,  breath sounds clear to auscultation  Pulmonary exam normal       Cardiovascular hypertension, On Medications and On Home Beta Blockers + CAD, + Past MI, + Cardiac Stents and + Peripheral Vascular Disease Rhythm:Regular Rate:Normal     Neuro/Psych negative neurological ROS  negative psych ROS   GI/Hepatic negative GI ROS, Neg liver ROS,   Endo/Other  Hypothyroidism   Renal/GU negative Renal ROS  negative genitourinary   Musculoskeletal   Abdominal   Peds  Hematology negative hematology ROS (+)   Anesthesia Other Findings   Reproductive/Obstetrics negative OB ROS                           Anesthesia Physical Anesthesia Plan  ASA: III  Anesthesia Plan: General   Post-op Pain Management:    Induction: Intravenous  Airway Management Planned: Oral ETT  Additional Equipment:   Intra-op Plan:   Post-operative Plan: Extubation in OR  Informed Consent: I have reviewed the patients History and Physical, chart, labs and discussed the procedure including the risks, benefits and alternatives for the proposed anesthesia with the patient or authorized representative who has indicated his/her understanding and acceptance.   Dental advisory given  Plan Discussed with: CRNA  Anesthesia Plan Comments:         Anesthesia Quick Evaluation

## 2012-12-23 NOTE — H&P (View-Only) (Signed)
Chief Complaint:  Polyp at hepatic flexure   HISTORY: Patient is a 72-year-old female who has had a history of colonic polyps and a mother who had colon cancer.  She has had numerous colonoscopies for polyps. She has had a polyp now in her hepatic flexure that has come back multiple times. Because of its location,  Dr. Jacobs has not been able to be confident of the negative margin.  She is referred for segmental colon resection. She is on an 18 GI bleeding as she is aware of. Of note, she has had a GI stromal tumor taken from her duodenum and 2008 by Dr. Howerton at [facility]. Recently, she has had right lower coronary pain. She says this has come back out while, but she has not had this evaluated other than the colonoscopy for several years. Her last CT scan was in 2011.    Past Medical History  Diagnosis Date  . Coronary atherosclerosis of unspecified type of vessel, native or graft   . Other and unspecified hyperlipidemia   . Unspecified essential hypertension   . Backache, unspecified   . Raynaud's syndrome   . Personal history of other diseases of digestive system   . Diverticulosis of colon (without mention of hemorrhage)   . Disorder of bone and cartilage, unspecified   . Diverticulosis of colon (without mention of hemorrhage)   . Hemorrhage of gastrointestinal tract, unspecified   . Unspecified hypothyroidism   . 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 Urinary tract infection, site not specified   . Urinary frequency   . Personal history of colonic polyps   . Hypothyroidism   . Anxiety state, unspecified     no per pt  . Arthritis   . Blood transfusion without reported diagnosis   . Myocardial infarction     2009  . Ulcer     Past Surgical History  Procedure Date  . Coronary angioplasty with stent placement     x1  . Gastrointestinal stomal tumor resection   . Cholecystectomy   . Tubal ligation   . Abdominal hysterectomy   . Appendectomy      Current Outpatient Prescriptions  Medication Sig Dispense Refill  . aspirin 81 MG tablet Take 81 mg by mouth daily.        . ibuprofen (ADVIL,MOTRIN) 200 MG tablet Take 200 mg by mouth every 6 (six) hours as needed. pain      . levothyroxine (SYNTHROID, LEVOTHROID) 100 MCG tablet Take 1 tablet (100 mcg total) by mouth daily.  90 tablet  3  . lisinopril (PRINIVIL,ZESTRIL) 20 MG tablet TAKE ONE TABLET BY MOUTH ONE TIME DAILY  90 tablet  1  . metoprolol succinate (TOPROL-XL) 50 MG 24 hr tablet TAKE ONE TABLET BY MOUTH ONE TIME DAILY  90 tablet  3  . nitroGLYCERIN (NITROSTAT) 0.4 MG SL tablet Place 1 tablet (0.4 mg total) under the tongue every 5 (five) minutes as needed for chest pain.  25 tablet  3     No Known Allergies   Family History  Problem Relation Age of Onset  . Heart disease Mother     BROTHER,SISTER  . Lung cancer Father     SISTER, UNCLE  . Cerebral aneurysm Sister     UNCLE NEPHEW  . Colon cancer Neg Hx   . Esophageal cancer Neg Hx   . Stomach cancer Neg Hx   . Rectal cancer Neg Hx      History     Social History  . Marital Status: Married    Spouse Name: N/A    Number of Children: N/A  . 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 Smoking status: Current Every Day Smoker -- 0.5 packs/day  . Smokeless tobacco: Never Used  . Alcohol Use: No  . Drug Use: No  . Sexually Active: None      REVIEW OF SYSTEMS - PERTINENT POSITIVES ONLY: 12 point review of systems negative other than HPI and PMH except for nasal congestion.  EXAM: Filed Vitals:   12/05/12 1344  BP: 138/80  Pulse: 72  Temp: 96.7 F (35.9 C)  Resp: 20    Gen:  No acute distress.  Well nourished and well groomed.   Neurological: Alert and oriented to person, place, and time. Coordination normal.  Head: Normocephalic and atraumatic.  Eyes: Conjunctivae are normal. Pupils are equal, round, and reactive to light. No scleral icterus.   Neck: Normal range of motion. Neck supple. No tracheal deviation or thyromegaly present.  Cardiovascular: Normal rate, regular rhythm, normal heart sounds and intact distal pulses.  Exam reveals no gallop and no friction rub.  No murmur heard. Respiratory: Effort normal.  No respiratory distress. No chest wall tenderness. Breath sounds coarse.  GI: Soft. Bowel sounds are normal. The abdomen is soft and nontender.  There is no rebound and no guarding. Midline scar.   Musculoskeletal: Normal range of motion. Extremities are nontender.  Lymphadenopathy: No cervical, preauricular, postauricular or axillary adenopathy is present Skin: Skin is warm and dry. No rash noted. No diaphoresis. No erythema. No pallor. No clubbing, cyanosis, or edema.   Psychiatric: Normal mood and affect. Behavior is normal. Judgment and thought content normal.    LABORATORY RESULTS: Available labs are reviewed  No recent   RADIOLOGY RESULTS: See E-Chart or I-Site for most recent results.  Images and reports are reviewed. None recent.   ASSESSMENT AND PLAN: Adenomatous colon polyp Patient has a recurrent serrated polyp at the hepatic flexure. This is then unable to be resected completely a colonoscopy. I would plan to do a right hemicolectomy.  I would still plan to attempt this laparoscopically. However, with her prior surgery and extended midline incision, she is at high risk to require an open operation. I discussed this with her.  I advised the patient to stop smoking to decrease her risk of colon leak, wound infection, and postoperative pneumonia.  I discussed complications such as: Bleeding Infection and possible wound complications such as hernia Damage to adjacent structures Leak of surgical connections, which can lead to other surgeries and possibly an ostomy. (5-10%) Possible need for other procedures, such as abscess drains in radiology  Possible prolonged hospital stay Possible diarrhea from  removal of part of the colon. Possible constipation from narcotics.    Prolonged fatigue/weakness/appetite MOST PATIENTS' ENERGY LEVEL IS NOT BACK TO NORMAL FOR AT LEAST 2-4 MONTHS.  OLDER PATIENTS MAY FEEL WEAK FOR LONGER PERIODS OF TIME.   Difficulty with eating or post operative nausea  Possible cancer in final pathology Possible complications of your medical problems such as heart disease or arrhythmias. Death (less than 1%)  She will need cardiac clearance from Dr. Cooper.     Also, based on her history of RLQ pain and GIST, I will order CT scan.     Claudean Leavelle L Astrid Vides MD Surgical Oncology, General and Endocrine Surgery [facility]        Visit Diagnoses: 1. Abdominal pain, right lower quadrant   2. Adenomatous colon polyp     Primary Care Physician: KWIATKOWSKI,PETER FRANK, MD    

## 2012-12-23 NOTE — Interval H&P Note (Signed)
History and Physical Interval Note:  12/23/2012 10:32 AM  [name]  has presented today for surgery, with the diagnosis of hepatic flexure polyp  The various methods of treatment have been discussed with the patient and family. After consideration of risks, benefits and other options for treatment, the patient has consented to  Procedure(s) (LRB) with comments: LAPAROSCOPIC PARTIAL COLECTOMY (N/A) as a surgical intervention .  The patient's history has been reviewed, patient examined, no change in status, stable for surgery.  I have reviewed the patient's chart and labs.  Questions were answered to the patient's satisfaction.     Fayrene Towner

## 2012-12-23 NOTE — Anesthesia Postprocedure Evaluation (Signed)
  Anesthesia Post-op Note  Patient: [name]  Procedure(s) Performed: Procedure(s) (LRB) with comments: LAPAROSCOPIC PARTIAL COLECTOMY (N/A)  Patient Location: PACU  Anesthesia Type:General  Level of Consciousness: awake  Airway and Oxygen Therapy: Patient Spontanous Breathing and Patient connected to nasal cannula oxygen  Post-op Pain: mild  Post-op Assessment: Post-op Vital signs reviewed, Patient's Cardiovascular Status Stable, Respiratory Function Stable, Patent Airway and No signs of Nausea or vomiting  Post-op Vital Signs: Reviewed and stable  Complications: No apparent anesthesia complications

## 2012-12-23 NOTE — Preoperative (Signed)
Beta Blockers   Reason not to administer Beta Blockers:Not Applicable 

## 2012-12-24 DIAGNOSIS — I498 Other specified cardiac arrhythmias: Secondary | ICD-10-CM

## 2012-12-24 DIAGNOSIS — J449 Chronic obstructive pulmonary disease, unspecified: Secondary | ICD-10-CM

## 2012-12-24 DIAGNOSIS — I10 Essential (primary) hypertension: Secondary | ICD-10-CM

## 2012-12-24 LAB — BASIC METABOLIC PANEL
CO2: 28 mEq/L (ref 19–32)
Calcium: 8.2 mg/dL — ABNORMAL LOW (ref 8.4–10.5)
GFR calc Af Amer: 64 mL/min — ABNORMAL LOW (ref >90)
GFR calc non Af Amer: 55 mL/min — ABNORMAL LOW (ref >90)
Sodium: 139 mEq/L (ref 135–145)

## 2012-12-24 LAB — CBC
MCH: 31.6 pg (ref 26.0–34.0)
MCHC: 31.9 g/dL (ref 30.0–36.0)
Platelets: 205 10*3/uL (ref 150–400)
RBC: 3.64 MIL/uL — ABNORMAL LOW (ref 3.87–5.11)
RDW: 14.4 % (ref 11.5–15.5)

## 2012-12-24 MED ORDER — SODIUM CHLORIDE 0.9 % IJ SOLN: 9.0000 mL | INTRAMUSCULAR | Status: DC | PRN

## 2012-12-24 MED ORDER — DIPHENHYDRAMINE HCL 50 MG/ML IJ SOLN: 12.5000 mg | Freq: Four times a day (QID) | INTRAMUSCULAR | Status: DC | PRN

## 2012-12-24 MED ORDER — ALVIMOPAN 12 MG PO CAPS
12.0000 mg | ORAL_CAPSULE | Freq: Two times a day (BID) | ORAL | Status: DC
  Administered 2012-12-24 – 2012-12-26 (×5): 12 mg via ORAL
  Filled 2012-12-24 (×7): qty 1

## 2012-12-24 MED ORDER — MORPHINE SULFATE (PF) 1 MG/ML IV SOLN
INTRAVENOUS | Status: DC
  Administered 2012-12-24: 1 mg via INTRAVENOUS
  Administered 2012-12-24: 3 mg via INTRAVENOUS
  Administered 2012-12-25 (×3): 1 mg via INTRAVENOUS
  Administered 2012-12-25: 2 mg via INTRAVENOUS
  Administered 2012-12-26: 3 mg via INTRAVENOUS

## 2012-12-24 MED ORDER — ONDANSETRON HCL 4 MG/2ML IJ SOLN: 4.0000 mg | Freq: Four times a day (QID) | INTRAMUSCULAR | Status: DC | PRN

## 2012-12-24 MED ORDER — METOPROLOL SUCCINATE 12.5 MG HALF TABLET
12.5000 mg | ORAL_TABLET | Freq: Every day | ORAL | Status: DC
  Administered 2012-12-25: 12.5 mg via ORAL
  Filled 2012-12-24 (×3): qty 1

## 2012-12-24 MED ORDER — DIPHENHYDRAMINE HCL 12.5 MG/5ML PO ELIX
12.5000 mg | ORAL_SOLUTION | Freq: Four times a day (QID) | ORAL | Status: DC | PRN
  Filled 2012-12-24: qty 5

## 2012-12-24 MED ORDER — NALOXONE HCL 0.4 MG/ML IJ SOLN: 0.4000 mg | INTRAMUSCULAR | Status: DC | PRN

## 2012-12-24 NOTE — Progress Notes (Signed)
UR completed 

## 2012-12-24 NOTE — Progress Notes (Signed)
1 Day Post-Op  Subjective: Doing ok other than bradycardia overnight.  Denies nausea.  Objective: Vital signs in last 24 hours: Temp:  [96.7 F (35.9 C)-98.4 F (36.9 C)] 98.2 F (36.8 C) (01/22 0700) Pulse Rate:  [49-68] 51  (01/22 0700) Resp:  [9-19] 18  (01/22 0820) BP: (109-156)/(54-75) 124/57 mmHg (01/22 0700) SpO2:  [95 %-100 %] 99 % (01/22 0820) Last BM Date: 12/23/12  Intake/Output from previous day: 01/21 0701 - 01/22 0700 In: 4800 [I.V.:4300; IV Piggyback:500] Out: 815 [Urine:765; Blood:50] Intake/Output this shift:    General appearance: alert, cooperative and mild distress Resp: breathing comfortably GI: soft, mild distention, approp tender at incision.  Lab Results:   Basename 12/24/12 0630 12/23/12 1800  WBC 7.5 10.4  HGB 11.5* 12.7  HCT 36.0 39.5  PLT 205 209   BMET  Basename 12/24/12 0630 12/23/12 1800 12/22/12 1355  NA 139 -- 140  K 4.3 -- 3.9  CL 105 -- 102  CO2 28 -- 29  GLUCOSE 109* -- 70  BUN 8 -- 13  CREATININE 1.01 0.98 --  CALCIUM 8.2* -- 9.6   PT/INR No results found for this basename: LABPROT:2,INR:2 in the last 72 hours ABG No results found for this basename: PHART:2,PCO2:2,PO2:2,HCO3:2 in the last 72 hours  Studies/Results: Dg Chest 2 View  12/22/2012  *RADIOLOGY REPORT*  Clinical Data: Cough.  Previous myocardial infarct. Pre-op respiratory exam.  CHEST - 2 VIEW  Comparison: 08/15/2009  Findings: Pulmonary hyperinflation again seen, consistent with COPD.  Heart size is normal.  Both lungs are clear.  No evidence of pleural effusion.  No mass or lymphadenopathy identified.  IMPRESSION: Stable exam.  COPD.  No active disease.   Original Report Authenticated By: Myles Rosenthal, M.D.     Anti-infectives: Anti-infectives     Start     Dose/Rate Route Frequency Ordered Stop   12/23/12 2200   cefoTEtan (CEFOTAN) 1 g in dextrose 5 % 50 mL IVPB        1 g 100 mL/hr over 30 Minutes Intravenous Every 12 hours 12/23/12 1619 12/24/12 2159   12/22/12 1430   cefoTEtan (CEFOTAN) 2 g in dextrose 5 % 50 mL IVPB        2 g 100 mL/hr over 30 Minutes Intravenous  Once 12/22/12 1419 12/23/12 1114          Assessment/Plan: s/p Procedure(s) (LRB) with comments: LAPAROSCOPIC PARTIAL COLECTOMY (N/A) d/c foley tomorrow 1/23 Ambulate Pulmonary toilet Bradycardia - decrease metoprolol HTN - ace inhibitor Dulera for COPD   LOS: 1 day    [facility] 12/24/2012

## 2012-12-25 LAB — BASIC METABOLIC PANEL
CO2: 29 mEq/L (ref 19–32)
Chloride: 105 mEq/L (ref 96–112)
GFR calc Af Amer: 79 mL/min — ABNORMAL LOW (ref >90)
Potassium: 4.1 mEq/L (ref 3.5–5.1)
Sodium: 141 mEq/L (ref 135–145)

## 2012-12-25 LAB — CBC
Platelets: 193 10*3/uL (ref 150–400)
RBC: 3.87 MIL/uL (ref 3.87–5.11)
WBC: 7.8 10*3/uL (ref 4.0–10.5)

## 2012-12-25 MED ORDER — LISINOPRIL 40 MG PO TABS
40.0000 mg | ORAL_TABLET | Freq: Every day | ORAL | Status: DC
  Administered 2012-12-26 – 2012-12-28 (×3): 40 mg via ORAL
  Filled 2012-12-25 (×3): qty 1

## 2012-12-25 MED ORDER — HYDRALAZINE HCL 20 MG/ML IJ SOLN
20.0000 mg | INTRAMUSCULAR | Status: DC | PRN
  Administered 2012-12-26: 20 mg via INTRAVENOUS
  Filled 2012-12-25: qty 1

## 2012-12-25 NOTE — Progress Notes (Signed)
Patient ID: [name], female   DOB: 05-Jul-1941, 72 y.o.   MRN: [id number] 2 Days Post-Op  Subjective: Improved bradycardia, now with hypertension.    Objective: Vital signs in last 24 hours: Temp:  [98 F (36.7 C)-98.5 F (36.9 C)] 98.4 F (36.9 C) (01/23 0531) Pulse Rate:  [50-65] 65  (01/23 0531) Resp:  [14-22] 22  (01/23 0815) BP: (127-174)/(67-85) 158/85 mmHg (01/23 0531) SpO2:  [91 %-100 %] 93 % (01/23 0815) Last BM Date: 12/23/12  Intake/Output from previous day: 01/22 0701 - 01/23 0700 In: 2485 [I.V.:2485] Out: 4075 [Urine:4075] Intake/Output this shift:    General appearance: alert, cooperative and mild distress Resp: breathing comfortably GI: soft, mild distention, approp tender at incision.  Lab Results:   Basename 12/25/12 0625 12/24/12 0630  WBC 7.8 7.5  HGB 12.3 11.5*  HCT 38.5 36.0  PLT 193 205   BMET  Basename 12/25/12 0625 12/24/12 0630  NA 141 139  K 4.1 4.3  CL 105 105  CO2 29 28  GLUCOSE 111* 109*  BUN 5* 8  CREATININE 0.85 1.01  CALCIUM 8.6 8.2*   PT/INR No results found for this basename: LABPROT:2,INR:2 in the last 72 hours ABG No results found for this basename: PHART:2,PCO2:2,PO2:2,HCO3:2 in the last 72 hours  Studies/Results: No results found.  Anti-infectives: Anti-infectives     Start     Dose/Rate Route Frequency Ordered Stop   12/23/12 2200   cefoTEtan (CEFOTAN) 1 g in dextrose 5 % 50 mL IVPB        1 g 100 mL/hr over 30 Minutes Intravenous Every 12 hours 12/23/12 1619 12/24/12 1156   12/22/12 1430   cefoTEtan (CEFOTAN) 2 g in dextrose 5 % 50 mL IVPB        2 g 100 mL/hr over 30 Minutes Intravenous  Once 12/22/12 1419 12/23/12 1114          Assessment/Plan: s/p Procedure(s) (LRB) with comments: LAPAROSCOPIC PARTIAL COLECTOMY (N/A) d/c foley tomorrow 1/23 Ambulate Pulmonary toilet Bradycardia - decreased metoprolol from baseline.  Will get OnQ out tomorrow. HTN - ace inhibitor increase and PRN  hydralazine Advance diet. Dulera for COPD   LOS: 2 days    [facility] 12/25/2012

## 2012-12-26 ENCOUNTER — Encounter (HOSPITAL_COMMUNITY): Payer: Self-pay | Admitting: General Surgery

## 2012-12-26 ENCOUNTER — Inpatient Hospital Stay (HOSPITAL_COMMUNITY): Payer: Medicare Other

## 2012-12-26 LAB — CBC
HCT: 36.5 % (ref 36.0–46.0)
Hemoglobin: 12.1 g/dL (ref 12.0–15.0)
RBC: 3.73 MIL/uL — ABNORMAL LOW (ref 3.87–5.11)

## 2012-12-26 LAB — BASIC METABOLIC PANEL
CO2: 27 mEq/L (ref 19–32)
Glucose, Bld: 99 mg/dL (ref 70–99)
Potassium: 3.9 mEq/L (ref 3.5–5.1)
Sodium: 142 mEq/L (ref 135–145)

## 2012-12-26 MED ORDER — METOPROLOL SUCCINATE ER 25 MG PO TB24
25.0000 mg | ORAL_TABLET | Freq: Every day | ORAL | Status: DC
  Administered 2012-12-26 – 2012-12-28 (×3): 25 mg via ORAL
  Filled 2012-12-26 (×3): qty 1

## 2012-12-26 MED ORDER — GUAIFENESIN ER 600 MG PO TB12
600.0000 mg | ORAL_TABLET | Freq: Two times a day (BID) | ORAL | Status: DC
  Administered 2012-12-26 – 2012-12-28 (×4): 600 mg via ORAL
  Filled 2012-12-26 (×7): qty 1

## 2012-12-26 MED ORDER — SODIUM CHLORIDE 0.9 % IN NEBU
3.0000 mL | INHALATION_SOLUTION | Freq: Three times a day (TID) | RESPIRATORY_TRACT | Status: DC | PRN
  Filled 2012-12-26: qty 3

## 2012-12-26 MED ORDER — MORPHINE SULFATE 2 MG/ML IJ SOLN: 1.0000 mg | INTRAMUSCULAR | Status: DC | PRN

## 2012-12-26 NOTE — Progress Notes (Signed)
Patient ID: [name], female   DOB: 07/27/1941, 72 y.o.   MRN: [id number] 3 Days Post-Op  Subjective: Pt says she "feels bad."  She cannot describe how.  She denies significant abdominal pain or nausea.  She denies rigors.  She feels very congested and flushed.  Had BM, but has not eaten more.    Objective: Vital signs in last 24 hours: Temp:  [98 F (36.7 C)-99.2 F (37.3 C)] 98.2 F (36.8 C) (01/24 0606) Pulse Rate:  [56-70] 65  (01/24 0606) Resp:  [12-22] 18  (01/24 0606) BP: (110-165)/(58-84) 165/84 mmHg (01/24 0606) SpO2:  [91 %-98 %] 97 % (01/24 0606) Last BM Date: 12/23/12  Intake/Output from previous day: 01/23 0701 - 01/24 0700 In: 2360 [P.O.:360; I.V.:2000] Out: 800 [Urine:800] Intake/Output this shift:    General appearance: alert, cooperative and mild distress Resp: breathing comfortably GI: soft, mild distention, approp tender at incision. No evidence of infection.    Lab Results:   [facility] 12/26/12 0632 12/25/12 0625  WBC 7.9 7.8  HGB 12.1 12.3  HCT 36.5 38.5  PLT 192 193   BMET  Basename 12/25/12 0625 12/24/12 0630  NA 141 139  K 4.1 4.3  CL 105 105  CO2 29 28  GLUCOSE 111* 109*  BUN 5* 8  CREATININE 0.85 1.01  CALCIUM 8.6 8.2*   PT/INR No results found for this basename: LABPROT:2,INR:2 in the last 72 hours ABG No results found for this basename: PHART:2,PCO2:2,PO2:2,HCO3:2 in the last 72 hours  Studies/Results: No results found.  Anti-infectives: Anti-infectives     Start     Dose/Rate Route Frequency Ordered Stop   12/23/12 2200   cefoTEtan (CEFOTAN) 1 g in dextrose 5 % 50 mL IVPB        1 g 100 mL/hr over 30 Minutes Intravenous Every 12 hours 12/23/12 1619 12/24/12 1156   12/22/12 1430   cefoTEtan (CEFOTAN) 2 g in dextrose 5 % 50 mL IVPB        2 g 100 mL/hr over 30 Minutes Intravenous  Once 12/22/12 1419 12/23/12 1114          Assessment/Plan: s/p Procedure(s) (LRB) with comments: LAPAROSCOPIC PARTIAL COLECTOMY  (N/A) d/c foley tomorrow 1/23 Ambulate Pulmonary toilet Bradycardia - improved a bit.  OnQ out HTN - ace inhibitor increase and PRN hydralazine Hold on diet advance due to mild distention.Elwin Sleight for COPD Add mucinex, saline nebs, and get CXR.   LOS: 3 days    [facility] 12/26/2012

## 2012-12-26 NOTE — Progress Notes (Signed)
On Q discontinued and all abdominal dressings as ordered.Incision site clean dry and intact.

## 2012-12-27 MED ORDER — HYDROCODONE-ACETAMINOPHEN 5-325 MG PO TABS: 1.0000 | ORAL_TABLET | ORAL | Status: DC | PRN

## 2012-12-27 NOTE — Progress Notes (Signed)
Patient ID: [name], female   DOB: 1941-05-19, 72 y.o.   MRN: [id number] 4 Days Post-Op  Subjective: Pt feeling much better today.  Ambulated more yesterday.  Continues to have flatus.  Had 2 bms yesterday.  CXR showed small bilat effusions.      Objective: Vital signs in last 24 hours: Temp:  [98.1 F (36.7 C)-99 F (37.2 C)] 99 F (37.2 C) (01/25 0547) Pulse Rate:  [59-70] 59  (01/25 0547) Resp:  [12-20] 20  (01/25 0547) BP: (125-152)/(57-78) 152/59 mmHg (01/25 0547) SpO2:  [91 %-94 %] 92 % (01/25 0547) Last BM Date: 12/22/12  Intake/Output from previous day: 01/24 0701 - 01/25 0700 In: 1257.5 [I.V.:1257.5] Out: -  Intake/Output this shift:    General appearance: alert, cooperative and mild distress Resp: breathing comfortably GI: soft, mild distention, approp tender at incision. No evidence of infection.    Lab Results:   [facility] 12/26/12 0632 12/25/12 0625  WBC 7.9 7.8  HGB 12.1 12.3  HCT 36.5 38.5  PLT 192 193   BMET  Basename 12/26/12 0632 12/25/12 0625  NA 142 141  K 3.9 4.1  CL 104 105  CO2 27 29  GLUCOSE 99 111*  BUN 5* 5*  CREATININE 0.74 0.85  CALCIUM 9.1 8.6   PT/INR No results found for this basename: LABPROT:2,INR:2 in the last 72 hours ABG No results found for this basename: PHART:2,PCO2:2,PO2:2,HCO3:2 in the last 72 hours  Studies/Results: Dg Chest 2 View  12/26/2012  *RADIOLOGY REPORT*  Clinical Data: Cough, congestion, wheezing.  CHEST - 2 VIEW  Comparison: 12/22/2012  Findings: There is hyperinflation of the lungs compatible with COPD.  New bibasilar airspace opacities with small effusions. Heart is normal size.  Tortuosity of the thoracic aorta.  No acute bony abnormality.  IMPRESSION: COPD.  Small bilateral effusions.  Bibasilar atelectasis or infiltrates.   Original Report Authenticated By: Charlett Nose, M.D.     Anti-infectives: Anti-infectives     Start     Dose/Rate Route Frequency Ordered Stop   12/23/12 2200   cefoTEtan  (CEFOTAN) 1 g in dextrose 5 % 50 mL IVPB        1 g 100 mL/hr over 30 Minutes Intravenous Every 12 hours 12/23/12 1619 12/24/12 1156   12/22/12 1430   cefoTEtan (CEFOTAN) 2 g in dextrose 5 % 50 mL IVPB        2 g 100 mL/hr over 30 Minutes Intravenous  Once 12/22/12 1419 12/23/12 1114          Assessment/Plan: s/p Procedure(s) (LRB) with comments: LAPAROSCOPIC PARTIAL COLECTOMY (N/A) d/c foley tomorrow 1/23 Ambulate Pulmonary toilet Bradycardia - continues to improve. HTN - ace inhibitor increase and PRN hydralazine Advance to heart healthy diet Saline lock. Home tomorrow possibly depending on how diet goes. Dulera for COPD, mucinex, saline nebs. .   LOS: 4 days    [facility] 12/27/2012

## 2012-12-28 MED ORDER — HYDROCODONE-ACETAMINOPHEN 5-325 MG PO TABS: 1.0000 | ORAL_TABLET | ORAL | Status: DC | PRN

## 2012-12-28 MED ORDER — NYSTATIN 100000 UNIT/ML MT SUSP
5.0000 mL | Freq: Four times a day (QID) | OROMUCOSAL | Status: DC
  Filled 2012-12-28 (×4): qty 5

## 2012-12-28 MED ORDER — GUAIFENESIN ER 600 MG PO TB12: 600.0000 mg | ORAL_TABLET | Freq: Two times a day (BID) | ORAL | Status: DC | PRN

## 2012-12-28 NOTE — Progress Notes (Signed)
Reviewed discharge instructions with patient. Home medications gone over. Prescription given. Diet, incisional care, and activity discussed. Signs and symptoms of infection gone over. Follow up appointment to be made. Patient verbalized understanding of instructions.

## 2012-12-28 NOTE — Discharge Summary (Signed)
Physician Discharge Summary  Patient ID: [name] MRN: [id number] DOB/AGE: 1941/09/30 72 y.o.  Admit date: 12/23/2012 Discharge date: 12/30/2012  Admission Diagnoses: Serrated adenoma of hepatic flexure Patient Active Problem List  Diagnosis  . NEOPLASM UNCERTAIN BHV CNCTV&OTH SOFT TISSUE  . HYPOTHYROIDISM  . HYPERLIPIDEMIA  . ANXIETY  . HYPERTENSION  . CORONARY ARTERY DISEASE  . CAROTID ARTERY DISEASE  . CEREBRAL ANEURYSM  . RAYNAUD'S SYNDROME  . Diverticulosis of Colon (without Mention of Hemorrhage)  . UNSPECIFIED HEMORRHAGE OF GASTROINTESTINAL TRACT  . UTI  . BACK PAIN  . OSTEOPENIA  . PALPITATIONS  . URINARY FREQUENCY  . ABDOMINAL PAIN-RLQ  . COLONIC POLYPS, HX OF  . GASTROINTESTINAL HEMORRHAGE, HX OF  . Tobacco abuse  . Adenomatous colon polyp     Discharge Diagnoses:  Same + thrush  Discharged Condition: stable  Hospital Course:  Patient was admitted to the floor following her laparoscopic right hemicolectomy.  She had some rocky issues up front with pain control and with her breathing. She is a smoker with COPD and was started on Mucinex to assist with her secretions. She needed a lot of encouragement to use her incentive spirometer. She also needed a lot of encouragement to get out of bed and use the restroom following Foley removal. Once her pain was better controlled she did much better. She was able to tolerate a regular diet following flatus. She developed thrush and was placed on nystatin swish and swallow.  She was able to transition to oral pain medication. She is discharged to home in stable condition.  Consults: None  Significant Diagnostic Studies: labs: HCT prior to d/c 36.5, K 3.9  Treatments: surgery: lap right hemicolectomy  Discharge Exam: Blood pressure 144/68, pulse 58, temperature 98.7 F (37.1 C), temperature source Oral, resp. rate 20, height 5\' 5"  (1.651 m), weight 129 lb 12.8 oz (58.877 kg), SpO2 93.00%. General appearance: alert,  cooperative and no distress Resp: junky upper respiratory sounds transmitted GI: soft, approp tender, no drainage or erythema of wound Extremities: extremities normal, atraumatic, no cyanosis or edema  Disposition: 01-Home or Self Care      Discharge Orders    Future Appointments: Provider: Department: Dept Phone: Center:   01/08/2013 11:00 AM Almond Lint, MD [facility] [phone] None     Future Orders Please Complete By Expires   Diet - low sodium heart healthy      Increase activity slowly      Call MD for:  temperature >100.4      Call MD for:  persistant nausea and vomiting      Call MD for:  severe uncontrolled pain      Call MD for:  redness, tenderness, or signs of infection (pain, swelling, redness, odor or green/yellow discharge around incision site)          Medication List     As of 12/30/2012  4:01 PM    TAKE these medications         aspirin 81 MG tablet   Take 81 mg by mouth daily.      guaiFENesin 600 MG 12 hr tablet   Commonly known as: MUCINEX   Take 1 tablet (600 mg total) by mouth 2 (two) times daily as needed for congestion.      HYDROcodone-acetaminophen 5-325 MG per tablet   Commonly known as: NORCO/VICODIN   Take 1-2 tablets by mouth every 4 (four) hours as needed for pain.      ibuprofen  200 MG tablet   Commonly known as: ADVIL,MOTRIN   Take 200 mg by mouth every 6 (six) hours as needed. pain      levothyroxine 100 MCG tablet   Commonly known as: SYNTHROID, LEVOTHROID   Take 1 tablet (100 mcg total) by mouth daily.      lisinopril 20 MG tablet   Commonly known as: PRINIVIL,ZESTRIL   TAKE ONE TABLET BY MOUTH ONE TIME DAILY      metoprolol succinate 50 MG 24 hr tablet   Commonly known as: TOPROL-XL   TAKE ONE TABLET BY MOUTH ONE TIME DAILY      mometasone-formoterol 200-5 MCG/ACT Aero   Commonly known as: DULERA   Inhale 2 puffs into the lungs 2 times daily at 12 noon and 4 pm.      nitroGLYCERIN 0.4 MG SL tablet    Commonly known as: NITROSTAT   Place 1 tablet (0.4 mg total) under the tongue every 5 (five) minutes as needed for chest pain.      UNKNOWN TO PATIENT   Antibiotic given to her by Dr. Donell Beers         Follow-up Information    Follow up with [facility], [facility]. Schedule an appointment as soon as possible for a visit in 2 weeks.   Contact information:   [address] [place] [address] [phone]          Signed: Almond Lint 12/30/2012, 4:01 PM

## 2012-12-30 ENCOUNTER — Telehealth (INDEPENDENT_AMBULATORY_CARE_PROVIDER_SITE_OTHER): Payer: Self-pay | Admitting: General Surgery

## 2012-12-30 NOTE — Telephone Encounter (Signed)
Husband of pt called to request medicine for wife's thrush.  Thrush in mouth and throat.  Please call to [facility]:  [phone].

## 2012-12-30 NOTE — Telephone Encounter (Signed)
I called and notified the pt's husband that I called in Nystatin suspension 100,000u/ mL Take  5 mL 4 x / day swish and swallow/ dispense 200 mL with no refills to [facility] [phone].

## 2013-01-08 ENCOUNTER — Ambulatory Visit (INDEPENDENT_AMBULATORY_CARE_PROVIDER_SITE_OTHER): Payer: Medicare Other | Admitting: General Surgery

## 2013-01-08 ENCOUNTER — Encounter (INDEPENDENT_AMBULATORY_CARE_PROVIDER_SITE_OTHER): Payer: Self-pay | Admitting: General Surgery

## 2013-01-08 VITALS — BP 142/84 | HR 74 | Temp 96.7°F | Resp 16 | Ht 65.0 in | Wt 124.6 lb

## 2013-01-08 DIAGNOSIS — D126 Benign neoplasm of colon, unspecified: Secondary | ICD-10-CM

## 2013-01-08 NOTE — Progress Notes (Signed)
HISTORY: Pt doing well after lap right colectomy.  She is no longer taking narcotics.  She denies nausea or vomiting.  She is eating well.  Her energy level is improving.  She denies fevers/ chills.  She has had some diarrhea, but has also had some firmer stools.  She has not had any wound drainage.  EXAM: General:  Alert and oriented.  Well groomed. Incision:  Slightly sore.  No evidence of infection or hernia.     PATHOLOGY: Colon, segmental resection for tumor, Right - SESSILE SERRATED ADENOMA, 2.1 CM. - SESSILE TUBULAR ADENOMA, 1.2 CM. - NO HIGH GRADE DYSPLASIA OR CARCINOMA. - SIXTEEN BENIGN LYMPH NODES (0/16).   ASSESSMENT AND PLAN:   Serrated adenoma of right colon No evidence of surgical complications.  Follow up as needed.   Stressed importance of quitting smoking.      Maudry Diego, MD Surgical Oncology, General & Endocrine Surgery [facility]  Rogelia Boga, MD Gordy Savers, MD

## 2013-01-08 NOTE — Patient Instructions (Signed)
Follow up as needed.  Call when you want to get set up for removal of buttock mass.

## 2013-02-16 ENCOUNTER — Telehealth (INDEPENDENT_AMBULATORY_CARE_PROVIDER_SITE_OTHER): Payer: Self-pay | Admitting: General Surgery

## 2013-02-16 NOTE — Telephone Encounter (Signed)
Pt called to report new peri-umbilical pain since surgery in January.  She states it is constant and becoming worse.  She had tried NSAIDs without much improvement.  Will try to schedule her in with Dr. Donell Beers.

## 2013-02-19 DIAGNOSIS — H61309 Acquired stenosis of external ear canal, unspecified, unspecified ear: Secondary | ICD-10-CM | POA: Diagnosis not present

## 2013-03-09 ENCOUNTER — Encounter (INDEPENDENT_AMBULATORY_CARE_PROVIDER_SITE_OTHER): Payer: Medicare Other | Admitting: General Surgery

## 2013-08-04 DIAGNOSIS — H903 Sensorineural hearing loss, bilateral: Secondary | ICD-10-CM | POA: Diagnosis not present

## 2013-08-04 DIAGNOSIS — H6120 Impacted cerumen, unspecified ear: Secondary | ICD-10-CM | POA: Diagnosis not present

## 2013-08-04 DIAGNOSIS — H61309 Acquired stenosis of external ear canal, unspecified, unspecified ear: Secondary | ICD-10-CM | POA: Diagnosis not present

## 2013-08-19 ENCOUNTER — Encounter: Payer: Self-pay | Admitting: Cardiovascular Disease

## 2013-08-19 ENCOUNTER — Ambulatory Visit (INDEPENDENT_AMBULATORY_CARE_PROVIDER_SITE_OTHER): Payer: Medicare Other | Admitting: Cardiovascular Disease

## 2013-08-19 VITALS — BP 138/80 | HR 58 | Ht 65.5 in | Wt 127.0 lb

## 2013-08-19 DIAGNOSIS — I2510 Atherosclerotic heart disease of native coronary artery without angina pectoris: Secondary | ICD-10-CM | POA: Diagnosis not present

## 2013-08-19 MED ORDER — NITROGLYCERIN 0.4 MG SL SUBL: 0.4000 mg | SUBLINGUAL_TABLET | SUBLINGUAL | Status: DC | PRN

## 2013-08-19 NOTE — Progress Notes (Signed)
HPI:   72 year old woman presenting for followup evaluation. The patient has coronary artery disease. She initially presented with non-ST elevation infarction in 2008 and was treated with bare-metal stenting of the mid LAD. She was seen last year with recurrent chest pain and underwent cardiac catheterization. She was noted to have patency of the stent in her mid LAD with diffuse nonobstructive disease in the right coronary artery and left circumflex vessels. The patient's LV function was normal. She returns today for followup.  The patient is doing fairly well. She has mild dyspnea with exertion. She continues to smoke. She denies orthopnea, PND, chest pain or pressure, palpitations, or leg swelling.  Outpatient Encounter Prescriptions as of 08/19/2013  Medication Sig Dispense Refill  . aspirin 81 MG tablet Take 81 mg by mouth daily.        Marland Kitchen levothyroxine (SYNTHROID, LEVOTHROID) 100 MCG tablet Take 1 tablet (100 mcg total) by mouth daily.  90 tablet  3  . lisinopril (PRINIVIL,ZESTRIL) 20 MG tablet TAKE ONE TABLET BY MOUTH ONE TIME DAILY  90 tablet  1  . metoprolol succinate (TOPROL-XL) 50 MG 24 hr tablet TAKE ONE TABLET BY MOUTH ONE TIME DAILY  90 tablet  3  . nitroGLYCERIN (NITROSTAT) 0.4 MG SL tablet Place 1 tablet (0.4 mg total) under the tongue every 5 (five) minutes as needed for chest pain.  25 tablet  3  . [DISCONTINUED] HYDROcodone-acetaminophen (NORCO/VICODIN) 5-325 MG per tablet Take 1-2 tablets by mouth every 4 (four) hours as needed for pain.  30 tablet  1  . [DISCONTINUED] mometasone-formoterol (DULERA) 200-5 MCG/ACT AERO Inhale 2 puffs into the lungs 2 times daily at 12 noon and 4 pm.      . [DISCONTINUED] UNKNOWN TO PATIENT Antibiotic given to her by Dr. Donell Beers       No facility-administered encounter medications on file as of 08/19/2013.    No Known Allergies  Past Medical History  Diagnosis Date  . Coronary atherosclerosis of unspecified type of vessel, native or graft     . Other and unspecified hyperlipidemia   . Backache, unspecified   . Raynaud's syndrome   . Personal history of other diseases of digestive system   . Diverticulosis of colon (without mention of hemorrhage)   . Disorder of bone and cartilage, unspecified   . Diverticulosis of colon (without mention of hemorrhage)   . Hemorrhage of gastrointestinal tract, unspecified   . Unspecified hypothyroidism   . 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 Urinary tract infection, site not specified   . Urinary frequency   . Personal history of colonic polyps     sees Dr. Christella Hartigan, gastro  . Hypothyroidism   . Anxiety state, unspecified     no per pt  . Arthritis   . Blood transfusion without reported diagnosis   . Ulcer   . Myocardial infarction     2009sees Dr. Excell Seltzer, saw last 02/2012  . Bronchitis     hx of  . Cough     "patient is a smoker"  . Pneumonia   . Anemia     hx of  . Diverticulitis     hx of  . Chronic back pain   . Unspecified essential hypertension     sees Dr. Eleonore Chiquito [phone] at [facility]   ROS: Negative except as per HPI  BP 138/80  Pulse 58  Ht 5' 5.5" (1.664 m)  Wt 127 lb (57.607 kg)  BMI  20.81 kg/m2  PHYSICAL EXAM: Pt is alert and oriented, NAD HEENT: normal Neck: JVP - normal, carotids 2+= without bruits Lungs: CTA bilaterally CV: RRR without murmur or gallop Abd: soft, NT, Positive BS, no hepatomegaly Ext: no C/C/E, distal pulses intact and equal Skin: warm/dry no rash  EKG:  Sinus rhythm 58 beats per minute, left axis deviation, possible anterior infarct age undetermined.  Cardiac cath 04/30/2012: Hemodynamic Findings:  Central aortic pressure: 101/53  Left ventricular pressure: 118/10/15  Angiographic Findings:  Left main: 30% mid stenosis with mild calcification. This vessel bifurcates into the LAD and Circumflex.  Left Anterior Descending Artery: Moderate sized vessel that courses to the apex and  gives off a small caliber diagonal branch. The proximal LAD has diffuse 30% calcified stenosis. The mid vessel has a patent stent with 25% restenosis in the proximal segment of the stent. The distal LAD has diffuse non-obstructive plaque. The diagonal is a small caliber vessel with 90% mid stenosis. The diagonal is a 1.0-1.5 mm vessel.  Circumflex Artery:Small caliber system with early OM branch. The OM branch is 1.6-1.7 mm in diameter and has an ostial 60-70% stenosis. The proximal Circumflex is a small caliber vessel with 60% stenosis. The remainder of the AV groove Circumflex appears to be diffusely diseased with no focal stenosis.  Right Coronary Artery: Large, dominant vessel with serial 30% stenoses in the proximal vessel. The mid vessel has a 40% stenosis. The PDA and PL branch are patent with mild plaque disease.  Left Ventricular Angiogram: LVEF 65-70%.  Impression:  1. Triple vessel CAD with patent stent mid LAD, diffuse non flow limiting disease in the RCA, diffuse disease in the small caliber Circumflex system that is too small for PCI.  2. Preserved LV systolic function  Recommendations: Continue medical management.  Complications: None. The patient tolerated the procedure well.   ASSESSMENT AND PLAN: 1. Coronary artery disease, native vessel. Cardiac catheterization from last year was reviewed and showed no significant obstructive disease. She will continue her current medical program. She will followup in one year.  2. Hypertension. Blood pressure is well controlled on a combination of lisinopril and metoprolol succinate.  3. Dyslipidemia. She has been unwilling to take statin drugs. We have discussed this extensively in the past.  4. Tobacco abuse. Continued smoking unlikely to quit. She was counseled today.  Tonny Bollman 08/21/2013 4:52 PM

## 2013-08-19 NOTE — Patient Instructions (Addendum)
Your physician wants you to follow-up in: 1 YEAR with Dr Cooper.  You will receive a reminder letter in the mail two months in advance. If you don't receive a letter, please call our office to schedule the follow-up appointment.  Your physician recommends that you continue on your current medications as directed. Please refer to the Current Medication list given to you today.  

## 2013-08-21 ENCOUNTER — Encounter: Payer: Self-pay | Admitting: Cardiovascular Disease

## 2013-08-26 ENCOUNTER — Ambulatory Visit (INDEPENDENT_AMBULATORY_CARE_PROVIDER_SITE_OTHER): Payer: Medicare Other | Admitting: Internal Medicine

## 2013-08-26 ENCOUNTER — Encounter: Payer: Self-pay | Admitting: Internal Medicine

## 2013-08-26 VITALS — BP 150/90 | HR 63 | Temp 97.7°F | Resp 18 | Ht 65.5 in | Wt 129.0 lb

## 2013-08-26 DIAGNOSIS — I10 Essential (primary) hypertension: Secondary | ICD-10-CM

## 2013-08-26 DIAGNOSIS — E785 Hyperlipidemia, unspecified: Secondary | ICD-10-CM

## 2013-08-26 DIAGNOSIS — Z86010 Personal history of colonic polyps: Secondary | ICD-10-CM

## 2013-08-26 DIAGNOSIS — I2510 Atherosclerotic heart disease of native coronary artery without angina pectoris: Secondary | ICD-10-CM

## 2013-08-26 DIAGNOSIS — E039 Hypothyroidism, unspecified: Secondary | ICD-10-CM | POA: Diagnosis not present

## 2013-08-26 DIAGNOSIS — Z720 Tobacco use: Secondary | ICD-10-CM

## 2013-08-26 DIAGNOSIS — M8580 Other specified disorders of bone density and structure, unspecified site: Secondary | ICD-10-CM

## 2013-08-26 DIAGNOSIS — D649 Anemia, unspecified: Secondary | ICD-10-CM

## 2013-08-26 DIAGNOSIS — F17200 Nicotine dependence, unspecified, uncomplicated: Secondary | ICD-10-CM | POA: Diagnosis not present

## 2013-08-26 DIAGNOSIS — Z0000 Encounter for general adult medical examination without abnormal findings: Secondary | ICD-10-CM | POA: Diagnosis not present

## 2013-08-26 DIAGNOSIS — M899 Disorder of bone, unspecified: Secondary | ICD-10-CM

## 2013-08-26 LAB — COMPREHENSIVE METABOLIC PANEL
ALT: 12 U/L (ref 0–35)
AST: 22 U/L (ref 0–37)
Alkaline Phosphatase: 74 U/L (ref 39–117)
CO2: 31 mEq/L (ref 19–32)
Creatinine, Ser: 0.9 mg/dL (ref 0.4–1.2)
GFR: 70.04 mL/min (ref >60.00)
Total Bilirubin: 0.5 mg/dL (ref 0.3–1.2)

## 2013-08-26 LAB — LIPID PANEL
Cholesterol: 275 mg/dL — ABNORMAL HIGH (ref 0–200)
HDL: 63.5 mg/dL (ref >39.00)
Total CHOL/HDL Ratio: 4
Triglycerides: 194 mg/dL — ABNORMAL HIGH (ref 0.0–149.0)
VLDL: 38.8 mg/dL (ref 0.0–40.0)

## 2013-08-26 LAB — CBC WITH DIFFERENTIAL/PLATELET
Basophils Absolute: 0 10*3/uL (ref 0.0–0.1)
HCT: 44.3 % (ref 36.0–46.0)
Hemoglobin: 14.9 g/dL (ref 12.0–15.0)
Lymphs Abs: 2.7 10*3/uL (ref 0.7–4.0)
Monocytes Relative: 7.3 % (ref 3.0–12.0)
Neutro Abs: 3.5 10*3/uL (ref 1.4–7.7)
RDW: 14.6 % (ref 11.5–14.6)

## 2013-08-26 MED ORDER — LISINOPRIL 20 MG PO TABS: ORAL_TABLET | ORAL | Status: DC

## 2013-08-26 MED ORDER — METOPROLOL SUCCINATE ER 50 MG PO TB24: ORAL_TABLET | ORAL | Status: DC

## 2013-08-26 MED ORDER — ATORVASTATIN CALCIUM 40 MG PO TABS: 40.0000 mg | ORAL_TABLET | Freq: Every day | ORAL | Status: DC

## 2013-08-26 MED ORDER — LEVOTHYROXINE SODIUM 100 MCG PO TABS: 100.0000 ug | ORAL_TABLET | Freq: Every day | ORAL | Status: DC

## 2013-08-26 NOTE — Patient Instructions (Signed)
Resume statin therapy  Smoking tobacco is very bad for your health. You should stop smoking immediately.  Return in 6 months for follow-up

## 2013-08-26 NOTE — Progress Notes (Signed)
Patient ID: [name], female   DOB: Feb 02, 1941, 72 y.o.   MRN: [id number]  Subjective:    Patient ID: [name], female    DOB: Apr 25, 1941, 72 y.o.   MRN: [id number]  HPI  72 year-old patient who is seen today for a health maintenance exam. She is followed closely by cardiology for coronary artery disease. She has been on statin therapy in the past but apparently is no longer taking pravastatin. Her cardiac status has been stable. Only complaint today is back pain which has been chronic she does have a history of osteopenia. She has a history of colonic polyps and was scheduled for colonoscopy followup in January.  She has ongoing tobacco use history of hypertension and dyslipidemia.  Past Medical History  Diagnosis Date  . Coronary atherosclerosis of unspecified type of vessel, native or graft   . Other and unspecified hyperlipidemia   . Backache, unspecified   . Raynaud's syndrome   . Personal history of other diseases of digestive system   . Diverticulosis of colon (without mention of hemorrhage)   . Disorder of bone and cartilage, unspecified   . Diverticulosis of colon (without mention of hemorrhage)   . Hemorrhage of gastrointestinal tract, unspecified   . Unspecified hypothyroidism   . 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 Urinary tract infection, site not specified   . Urinary frequency   . Personal history of colonic polyps     sees Dr. Christella Hartigan, gastro  . Hypothyroidism   . Anxiety state, unspecified     no per pt  . Arthritis   . Blood transfusion without reported diagnosis   . Ulcer   . Myocardial infarction     2009sees Dr. Excell Seltzer, saw last 02/2012  . Bronchitis     hx of  . Cough     "patient is a smoker"  . Pneumonia   . Anemia     hx of  . Diverticulitis     hx of  . Chronic back pain   . Unspecified essential hypertension     sees Dr. Eleonore Chiquito [phone] at [facility]    History   Social History  .  Marital Status: Married    Spouse Name: N/A    Number of Children: N/A  . 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 Smoking status: Current Every Day Smoker -- 0.50 packs/day for 40 years    Types: Cigarettes  . Smokeless tobacco: Never Used  . Alcohol Use: No  . Drug Use: No  . Sexual Activity: Not on file   Other Topics Concern  . Not on file   Social History Narrative  . No narrative on file    Past Surgical History  Procedure Laterality Date  . Gastrointestinal stomal tumor resection    . Cholecystectomy    . Tubal ligation    . Abdominal hysterectomy    . Appendectomy    . Coronary angioplasty with stent placement      x1, 2007or 2008  . Abdominal hysterectomy    . Partial colectomy  12/23/2012  . Laparoscopic partial colectomy  12/23/2012    Procedure: LAPAROSCOPIC PARTIAL COLECTOMY;  Surgeon: Almond Lint, MD;  Location: [facility] OR;  Service: General;  Laterality: N/A;    Family History  Problem Relation Age of Onset  . Heart disease Mother     Arta Bruce  . Lung  cancer Father     SISTER, UNCLE  . Cerebral aneurysm Sister     UNCLE NEPHEW  . Colon cancer Neg Hx   . Esophageal cancer Neg Hx   . Stomach cancer Neg Hx   . Rectal cancer Neg Hx     No Known Allergies  Current Outpatient Prescriptions on File Prior to Visit  Medication Sig Dispense Refill  . aspirin 81 MG tablet Take 81 mg by mouth daily.        . nitroGLYCERIN (NITROSTAT) 0.4 MG SL tablet Place 1 tablet (0.4 mg total) under the tongue every 5 (five) minutes as needed for chest pain.  25 tablet  3   No current facility-administered medications on file prior to visit.    BP 150/90  Pulse 63  Temp(Src) 97.7 F (36.5 C) (Oral)  Resp 18  Ht 5' 5.5" (1.664 m)  Wt 129 lb (58.514 kg)  BMI 21.13 kg/m2  SpO2 93%    1. Risk factors, based on past  M,S,F history- patient has known coronary artery disease status post stenting  cardiovascular risk factors include tobacco use dyslipidemia hypertension.  2.  Physical activities: No activity restrictions  3.  Depression/mood: No history depression or mood disorder  4.  Hearing: No deficits  5.  ADL's: Independent in all aspects of daily living  6.  Fall risk: Low  7.  Home safety: No problems identified  8.  Height weight, and visual acuity; height and weight stable no change in visual acuity  9.  Counseling: Discontinuation of tobacco encouraged we'll resume statin therapy  10. Lab orders based on risk factors: Laboratory profile including lipid panel vitamin D TSH will be reviewed  11. Referral : Followup cardiology  12. Care plan: Followup colonoscopy this January  resume statin therapy   13. Cognitive assessment: Alert and oriented normal affect. No cognitive dysfunction        Review of Systems  Constitutional: Negative for fever, appetite change, fatigue and unexpected weight change.  HENT: Negative for hearing loss, ear pain, nosebleeds, congestion, sore throat, mouth sores, trouble swallowing, neck stiffness, dental problem, voice change, sinus pressure and tinnitus.   Eyes: Negative for photophobia, pain, redness and visual disturbance.  Respiratory: Negative for cough, chest tightness and shortness of breath.   Cardiovascular: Negative for chest pain, palpitations and leg swelling.  Gastrointestinal: Negative for nausea, vomiting, abdominal pain, diarrhea, constipation, blood in stool, abdominal distention and rectal pain.  Genitourinary: Negative for dysuria, urgency, frequency, hematuria, flank pain, vaginal bleeding, vaginal discharge, difficulty urinating, genital sores, vaginal pain, menstrual problem and pelvic pain.  Musculoskeletal: Negative for back pain and arthralgias.  Skin: Negative for rash.  Neurological: Negative for dizziness, syncope, speech difficulty, weakness, light-headedness, numbness and headaches.  Hematological:  Negative for adenopathy. Does not bruise/bleed easily.  Psychiatric/Behavioral: Negative for suicidal ideas, behavioral problems, self-injury, dysphoric mood and agitation. The patient is not nervous/anxious.        Objective:   Physical Exam  Constitutional: She is oriented to person, place, and time. She appears well-developed and well-nourished.  HENT:  Head: Normocephalic and atraumatic.  Right Ear: External ear normal.  Left Ear: External ear normal.  Mouth/Throat: Oropharynx is clear and moist.  Edentulous  Cerumen impactions bilaterally  Eyes: Conjunctivae and EOM are normal.  Neck: Normal range of motion. Neck supple. No JVD present. No thyromegaly present.  Cardiovascular: Normal rate, regular rhythm, normal heart sounds and intact distal pulses.   No murmur heard. Pulmonary/Chest: Effort  normal and breath sounds normal. She has no wheezes. She has no rales.  Abdominal: Soft. Bowel sounds are normal. She exhibits no distension and no mass. There is no tenderness. There is no rebound and no guarding.   Surgical scar  Genitourinary: Vagina normal.  Musculoskeletal: Normal range of motion. She exhibits no edema and no tenderness.  Neurological: She is alert and oriented to person, place, and time. She has normal reflexes. No cranial nerve deficit. She exhibits normal muscle tone. Coordination normal.  Skin: Skin is warm and dry. No rash noted.  Psychiatric: She has a normal mood and affect. Her behavior is normal.          Assessment & Plan:   Preventive health examination Coronary artery disease Ongoing tobacco use. Total smoking cessation encouraged Dyslipidemia. We'll resume statin therapy Colonic polyps. Followup colonoscopy this year  Recheck 6 months

## 2013-09-01 ENCOUNTER — Telehealth: Payer: Self-pay | Admitting: Internal Medicine

## 2013-09-01 NOTE — Telephone Encounter (Signed)
Pt would like to try a topical cream for arthritis. Pt had the company ([employer]) fax an order to Dr Kirtland Bouchard last week and today about 12:30 pm. Pt states medicare will pay for this. Pls advise. Pt would like to steer away from medicine.

## 2013-09-03 NOTE — Telephone Encounter (Signed)
Left message on voicemail to call office.  

## 2013-09-04 MED ORDER — DICLOFENAC SODIUM 1 % TD GEL: TRANSDERMAL | Status: DC

## 2013-09-04 NOTE — Telephone Encounter (Signed)
Spoke to pt told her Dr.K does not like to order compounded topical medications from unknown pharmacy's. Told pt we can order Voltaren Gel for her to try topically for her back. Pt verbalized understanding and said okay I just do not want to take po pain medicine. Told her okay lets try this. Rx sent to pharmacy. Pt verbalized understanding.

## 2013-09-16 ENCOUNTER — Telehealth: Payer: Self-pay | Admitting: Internal Medicine

## 2013-09-16 NOTE — Telephone Encounter (Signed)
Pharm following up for pt on a compound pain cream for arthritis and back pain. Pharm has been faxing since Aug. Last faxed 10/15. pls advise.

## 2013-09-17 NOTE — Telephone Encounter (Signed)
Called Judeth Cornfield back told her Dr. Kirtland Bouchard is not ordering medication for pt and pt is aware. Judeth Cornfield verbalized understanding.

## 2013-09-22 ENCOUNTER — Ambulatory Visit: Payer: Medicare Other | Admitting: Internal Medicine

## 2013-09-28 ENCOUNTER — Encounter: Payer: Self-pay | Admitting: Internal Medicine

## 2013-09-28 ENCOUNTER — Other Ambulatory Visit: Payer: Self-pay | Admitting: *Deleted

## 2013-09-28 ENCOUNTER — Ambulatory Visit (INDEPENDENT_AMBULATORY_CARE_PROVIDER_SITE_OTHER): Payer: Medicare Other | Admitting: Internal Medicine

## 2013-09-28 VITALS — BP 110/70 | HR 67 | Temp 98.0°F | Resp 18 | Wt 128.0 lb

## 2013-09-28 DIAGNOSIS — I10 Essential (primary) hypertension: Secondary | ICD-10-CM | POA: Diagnosis not present

## 2013-09-28 DIAGNOSIS — E039 Hypothyroidism, unspecified: Secondary | ICD-10-CM | POA: Diagnosis not present

## 2013-09-28 DIAGNOSIS — M810 Age-related osteoporosis without current pathological fracture: Secondary | ICD-10-CM

## 2013-09-28 DIAGNOSIS — E785 Hyperlipidemia, unspecified: Secondary | ICD-10-CM

## 2013-09-28 MED ORDER — LEVOTHYROXINE SODIUM 125 MCG PO TABS: 125.0000 ug | ORAL_TABLET | Freq: Every day | ORAL | Status: DC

## 2013-09-28 NOTE — Progress Notes (Signed)
Subjective:    Patient ID: [name], female    DOB: 08-23-1941, 72 y.o.   MRN: [id number]  HPI  72 year old patient who has a history of hypothyroidism. One month ago her TSH was elevated but she had been noncompliant with her medication. For the past month she states she is only perhaps missed a single dose. She complains of some fatigue. She also complains of a irritated lesion beneath her left eye. She has treated hypertension coronary artery disease and ongoing tobacco use  Past Medical History  Diagnosis Date  . Coronary atherosclerosis of unspecified type of vessel, native or graft   . Other and unspecified hyperlipidemia   . Backache, unspecified   . Raynaud's syndrome   . Personal history of other diseases of digestive system   . Diverticulosis of colon (without mention of hemorrhage)   . Disorder of bone and cartilage, unspecified   . Diverticulosis of colon (without mention of hemorrhage)   . Hemorrhage of gastrointestinal tract, unspecified   . Unspecified hypothyroidism   . 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 Urinary tract infection, site not specified   . Urinary frequency   . Personal history of colonic polyps     sees Dr. Christella Hartigan, gastro  . Hypothyroidism   . Anxiety state, unspecified     no per pt  . Arthritis   . Blood transfusion without reported diagnosis   . Ulcer   . Myocardial infarction     2009sees Dr. Excell Seltzer, saw last 02/2012  . Bronchitis     hx of  . Cough     "patient is a smoker"  . Pneumonia   . Anemia     hx of  . Diverticulitis     hx of  . Chronic back pain   . Unspecified essential hypertension     sees Dr. Eleonore Chiquito [phone] at [facility]    History   Social History  . Marital Status: Married    Spouse Name: N/A    Number of Children: N/A  . 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 Smoking status: Current Every Day  Smoker -- 0.50 packs/day for 40 years    Types: Cigarettes  . Smokeless tobacco: Never Used  . Alcohol Use: No  . Drug Use: No  . Sexual Activity: Not on file   Other Topics Concern  . Not on file   Social History Narrative  . No narrative on file    Past Surgical History  Procedure Laterality Date  . Gastrointestinal stomal tumor resection    . Cholecystectomy    . Tubal ligation    . Abdominal hysterectomy    . Appendectomy    . Coronary angioplasty with stent placement      x1, 2007or 2008  . Abdominal hysterectomy    . Partial colectomy  12/23/2012  . Laparoscopic partial colectomy  12/23/2012    Procedure: LAPAROSCOPIC PARTIAL COLECTOMY;  Surgeon: Almond Lint, MD;  Location: [facility] OR;  Service: General;  Laterality: N/A;    Family History  Problem Relation Age of Onset  . Heart disease Mother     Arta Bruce  . Lung cancer Father     SISTER, UNCLE  . Cerebral aneurysm Sister     UNCLE NEPHEW  . Colon cancer Neg Hx   . Esophageal cancer Neg Hx   . Stomach cancer Neg Hx   .  Rectal cancer Neg Hx     No Known Allergies  Current Outpatient Prescriptions on File Prior to Visit  Medication Sig Dispense Refill  . aspirin 81 MG tablet Take 81 mg by mouth daily.        . diclofenac sodium (VOLTAREN) 1 % GEL Apply topically to areas four times a day as needed.  100 g  2  . levothyroxine (SYNTHROID, LEVOTHROID) 100 MCG tablet Take 1 tablet (100 mcg total) by mouth daily.  90 tablet  3  . lisinopril (PRINIVIL,ZESTRIL) 20 MG tablet TAKE ONE TABLET BY MOUTH ONE TIME DAILY  90 tablet  3  . metoprolol succinate (TOPROL-XL) 50 MG 24 hr tablet TAKE ONE TABLET BY MOUTH ONE TIME DAILY  90 tablet  3  . nitroGLYCERIN (NITROSTAT) 0.4 MG SL tablet Place 1 tablet (0.4 mg total) under the tongue every 5 (five) minutes as needed for chest pain.  25 tablet  3   No current facility-administered medications on file prior to visit.    BP 110/70  Pulse 67  Temp(Src) 98 F (36.7 C)  (Oral)  Resp 18  Wt 128 lb (58.06 kg)  BMI 20.97 kg/m2  SpO2 96%       Review of Systems  Constitutional: Positive for fatigue.  HENT: Negative for congestion, dental problem, hearing loss, rhinorrhea, sinus pressure, sore throat and tinnitus.   Eyes: Negative for pain, discharge and visual disturbance.  Respiratory: Negative for cough and shortness of breath.   Cardiovascular: Negative for chest pain, palpitations and leg swelling.  Gastrointestinal: Negative for nausea, vomiting, abdominal pain, diarrhea, constipation, blood in stool and abdominal distention.  Genitourinary: Negative for dysuria, urgency, frequency, hematuria, flank pain, vaginal bleeding, vaginal discharge, difficulty urinating, vaginal pain and pelvic pain.  Musculoskeletal: Negative for arthralgias, gait problem and joint swelling.  Skin: Positive for rash.  Neurological: Negative for dizziness, syncope, speech difficulty, weakness, numbness and headaches.  Hematological: Negative for adenopathy.  Psychiatric/Behavioral: Negative for behavioral problems, dysphoric mood and agitation. The patient is not nervous/anxious.        Objective:   Physical Exam  Constitutional: She is oriented to person, place, and time. She appears well-developed and well-nourished.  Blood pressure 110/70  HENT:  Head: Normocephalic.  Right Ear: External ear normal.  Left Ear: External ear normal.  Mouth/Throat: Oropharynx is clear and moist.  Eyes: Conjunctivae and EOM are normal. Pupils are equal, round, and reactive to light.  Neck: Normal range of motion. Neck supple. No thyromegaly present.  Cardiovascular: Normal rate, regular rhythm, normal heart sounds and intact distal pulses.   Pulmonary/Chest: Effort normal and breath sounds normal.  Abdominal: Soft. Bowel sounds are normal. She exhibits no mass. There is no tenderness.  Musculoskeletal: Normal range of motion.  Lymphadenopathy:    She has no cervical adenopathy.   Neurological: She is alert and oriented to person, place, and time.  Skin: Skin is warm and dry. No rash noted.  Multiple facial skin lesions consistent with small seborrheic dermatoses. The lesion beneath her left eye appeared to be slightly irritated  Psychiatric: She has a normal mood and affect. Her behavior is normal.          Assessment & Plan:   Fatigue history of hypothyroidism. We'll check a TSH she has been compliant with her medications. May need dose adjustment Hypertension stable Coronary artery disease stable Ongoing tobacco use  The patient appears to have an irritated seborrheic dermatosis involving her left facial area. She was given  recommendations for dermatology evaluation  Return in 6 months for followup

## 2013-09-28 NOTE — Patient Instructions (Addendum)
Take a calcium supplement, plus [phone] units of vitamin D    It is important that you exercise regularly, at least 20 minutes 3 to 4 times per week.  If you develop chest pain or shortness of breath seek  medical attention.  Please check your blood pressure on a regular basis.  If it is consistently greater than 150/90, please make an office appointment.   Dermatology followup as discussed

## 2013-12-10 DIAGNOSIS — L821 Other seborrheic keratosis: Secondary | ICD-10-CM | POA: Diagnosis not present

## 2013-12-10 DIAGNOSIS — I781 Nevus, non-neoplastic: Secondary | ICD-10-CM | POA: Diagnosis not present

## 2013-12-22 ENCOUNTER — Telehealth: Payer: Self-pay | Admitting: Gastroenterology

## 2013-12-22 NOTE — Telephone Encounter (Signed)
Dr Ardis Hughs the patient would like to know when she needs a follow up.  She had a partial colectomy last year.

## 2013-12-23 NOTE — Telephone Encounter (Signed)
Recall has been added to EPIC Left message on machine to call back

## 2013-12-23 NOTE — Telephone Encounter (Signed)
Had surgery 12/2012, colonoscopy 10/2012; she needs repeat colonoscopy 10/2015

## 2013-12-25 NOTE — Telephone Encounter (Signed)
Pt has been notified.

## 2013-12-31 ENCOUNTER — Other Ambulatory Visit: Payer: Self-pay | Admitting: Internal Medicine

## 2014-01-11 DIAGNOSIS — H61309 Acquired stenosis of external ear canal, unspecified, unspecified ear: Secondary | ICD-10-CM | POA: Diagnosis not present

## 2014-01-11 DIAGNOSIS — H6120 Impacted cerumen, unspecified ear: Secondary | ICD-10-CM | POA: Diagnosis not present

## 2014-01-11 DIAGNOSIS — H903 Sensorineural hearing loss, bilateral: Secondary | ICD-10-CM | POA: Diagnosis not present

## 2014-02-25 ENCOUNTER — Ambulatory Visit (INDEPENDENT_AMBULATORY_CARE_PROVIDER_SITE_OTHER): Payer: Medicare Other | Admitting: Internal Medicine

## 2014-02-25 ENCOUNTER — Encounter: Payer: Self-pay | Admitting: Internal Medicine

## 2014-02-25 VITALS — BP 120/80 | HR 63 | Temp 98.6°F | Resp 18 | Ht 65.5 in | Wt 126.0 lb

## 2014-02-25 DIAGNOSIS — M1610 Unilateral primary osteoarthritis, unspecified hip: Secondary | ICD-10-CM | POA: Diagnosis not present

## 2014-02-25 DIAGNOSIS — I10 Essential (primary) hypertension: Secondary | ICD-10-CM

## 2014-02-25 DIAGNOSIS — M169 Osteoarthritis of hip, unspecified: Secondary | ICD-10-CM

## 2014-02-25 DIAGNOSIS — M549 Dorsalgia, unspecified: Secondary | ICD-10-CM | POA: Diagnosis not present

## 2014-02-25 DIAGNOSIS — M1612 Unilateral primary osteoarthritis, left hip: Secondary | ICD-10-CM | POA: Insufficient documentation

## 2014-02-25 DIAGNOSIS — M7022 Olecranon bursitis, left elbow: Secondary | ICD-10-CM

## 2014-02-25 DIAGNOSIS — M7020 Olecranon bursitis, unspecified elbow: Secondary | ICD-10-CM | POA: Diagnosis not present

## 2014-02-25 NOTE — Patient Instructions (Signed)
Rheumatology followup as discussedOlecranon Bursitis Bursitis is swelling and soreness (inflammation) of a fluid-filled sac (bursa) that covers and protects a joint. Olecranon bursitis occurs over the elbow.  CAUSES Bursitis can be caused by injury, overuse of the joint, arthritis, or infection.  SYMPTOMS   Tenderness, swelling, warmth, or redness over the elbow.  Elbow pain with movement. This is greater with bending the elbow.  Squeaking sound when the bursa is rubbed or moved.  Increasing size of the bursa without pain or discomfort.  Fever with increasing pain and swelling if the bursa becomes infected. HOME CARE INSTRUCTIONS   Put ice on the affected area.  Put ice in a plastic bag.  Place a towel between your skin and the bag.  Leave the ice on for 15-20 minutes each hour while awake. Do this for the first 2 days.  When resting, elevate your elbow above the level of your heart. This helps reduce swelling.  Continue to put the joint through a full range of motion 4 times per day. Rest the injured joint at other times. When the pain lessens, begin normal slow movements and usual activities.  Only take over-the-counter or prescription medicines for pain, discomfort, or fever as directed by your caregiver.  Reduce your intake of milk and related dairy products (cheese, yogurt). They may make your condition worse. SEEK IMMEDIATE MEDICAL CARE IF:   Your pain increases even during treatment.  You have a fever.  You have heat and inflammation over the bursa and elbow.  You have a red line that goes up your arm.  You have pain with movement of your elbow. MAKE SURE YOU:   Understand these instructions.  Will watch your condition.  Will get help right away if you are not doing well or get worse. Document Released: 12/19/2006 Document Revised: 02/11/2012 Document Reviewed: 11/04/2007 [facility] Patient Information 2014 [place], [place].

## 2014-02-25 NOTE — Progress Notes (Signed)
Pre-visit discussion using our clinic review tool. No additional management support is needed unless otherwise documented below in the visit note.  

## 2014-02-25 NOTE — Progress Notes (Signed)
Subjective:    Patient ID: [name], female    DOB: 26-Aug-1941, 73 y.o.   MRN: [id number]  HPI  73 year old patient, who presents today with a chief complaint of left elbow pain.  This has been present for about 2 months.  She is right-handed. She also complains of low back and cervical pain.  She has pain in the small joints of the hands.  She does have a history of osteoarthritis.  Cervical spine x-rays in 2009 reveals significant osteoarthritic changes.  She is requesting referral to rheumatology  Past Medical History  Diagnosis Date  . Coronary atherosclerosis of unspecified type of vessel, native or graft   . Other and unspecified hyperlipidemia   . Backache, unspecified   . Raynaud's syndrome   . Personal history of other diseases of digestive system   . Diverticulosis of colon (without mention of hemorrhage)   . Disorder of bone and cartilage, unspecified   . Diverticulosis of colon (without mention of hemorrhage)   . Hemorrhage of gastrointestinal tract, unspecified   . Unspecified hypothyroidism   . 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 Urinary tract infection, site not specified   . Urinary frequency   . Personal history of colonic polyps     sees Dr. Ardis Hughs, gastro  . Hypothyroidism   . Anxiety state, unspecified     no per pt  . Arthritis   . Blood transfusion without reported diagnosis   . Ulcer   . Myocardial infarction     2009sees Dr. Burt Knack, saw last 02/2012  . Bronchitis     hx of  . Cough     "patient is a smoker"  . Pneumonia   . Anemia     hx of  . Diverticulitis     hx of  . Chronic back pain   . Unspecified essential hypertension     sees Dr. Bluford Kaufmann [phone] at [facility]    History   Social History  . Marital Status: Married    Spouse Name: N/A    Number of Children: N/A  . 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 Smoking  status: Current Every Day Smoker -- 0.50 packs/day for 40 years    Types: Cigarettes  . Smokeless tobacco: Never Used  . Alcohol Use: No  . Drug Use: No  . Sexual Activity: Not on file   Other Topics Concern  . Not on file   Social History Narrative  . No narrative on file    Past Surgical History  Procedure Laterality Date  . Gastrointestinal stomal tumor resection    . Cholecystectomy    . Tubal ligation    . Abdominal hysterectomy    . Appendectomy    . Coronary angioplasty with stent placement      x1, 2007or 2008  . Abdominal hysterectomy    . Partial colectomy  12/23/2012  . Laparoscopic partial colectomy  12/23/2012    Procedure: LAPAROSCOPIC PARTIAL COLECTOMY;  Surgeon: Stark Klein, MD;  Location: [facility] OR;  Service: General;  Laterality: N/A;    Family History  Problem Relation Age of Onset  . Heart disease Mother     Verdie Drown  . Lung cancer Father     SISTER, UNCLE  . Cerebral aneurysm Sister     UNCLE NEPHEW  . Colon cancer Neg Hx   . Esophageal cancer Neg Hx   .  Stomach cancer Neg Hx   . Rectal cancer Neg Hx     No Known Allergies  Current Outpatient Prescriptions on File Prior to Visit  Medication Sig Dispense Refill  . aspirin 81 MG tablet Take 81 mg by mouth daily.        Marland Kitchen levothyroxine (SYNTHROID, LEVOTHROID) 125 MCG tablet Take 1 tablet (125 mcg total) by mouth daily.  90 tablet  1  . lisinopril (PRINIVIL,ZESTRIL) 20 MG tablet TAKE ONE TABLET BY MOUTH ONE TIME DAILY  90 tablet  3  . metoprolol succinate (TOPROL-XL) 50 MG 24 hr tablet TAKE 1 TABLET BY MOUTH EVERY DAY  90 tablet  1  . nitroGLYCERIN (NITROSTAT) 0.4 MG SL tablet Place 1 tablet (0.4 mg total) under the tongue every 5 (five) minutes as needed for chest pain.  25 tablet  3   No current facility-administered medications on file prior to visit.    BP 120/80  Pulse 63  Temp(Src) 98.6 F (37 C) (Oral)  Resp 18  Ht 5' 5.5" (1.664 m)  Wt 126 lb (57.153 kg)  BMI 20.64 kg/m2  SpO2  97%       Review of Systems  Constitutional: Negative.   HENT: Negative for congestion, dental problem, hearing loss, rhinorrhea, sinus pressure, sore throat and tinnitus.   Eyes: Negative for pain, discharge and visual disturbance.  Respiratory: Negative for cough and shortness of breath.   Cardiovascular: Negative for chest pain, palpitations and leg swelling.  Gastrointestinal: Negative for nausea, vomiting, abdominal pain, diarrhea, constipation, blood in stool and abdominal distention.  Genitourinary: Negative for dysuria, urgency, frequency, hematuria, flank pain, vaginal bleeding, vaginal discharge, difficulty urinating, vaginal pain and pelvic pain.  Musculoskeletal: Positive for arthralgias, joint swelling, neck pain and neck stiffness. Negative for gait problem.  Skin: Negative for rash.  Neurological: Negative for dizziness, syncope, speech difficulty, weakness, numbness and headaches.  Hematological: Negative for adenopathy.  Psychiatric/Behavioral: Negative for behavioral problems, dysphoric mood and agitation. The patient is not nervous/anxious.        Objective:   Physical Exam  Constitutional: She is oriented to person, place, and time. She appears well-developed and well-nourished.  HENT:  Head: Normocephalic.  Right Ear: External ear normal.  Left Ear: External ear normal.  Mouth/Throat: Oropharynx is clear and moist.  Eyes: Conjunctivae and EOM are normal. Pupils are equal, round, and reactive to light.  Neck: Normal range of motion. Neck supple. No thyromegaly present.  Cardiovascular: Normal rate, regular rhythm, normal heart sounds and intact distal pulses.   Pulmonary/Chest: Effort normal and breath sounds normal.  Abdominal: Soft. Bowel sounds are normal. She exhibits no mass. There is no tenderness.  Musculoskeletal: Normal range of motion.  Slightly tender over the left olecranon bursa, which was slightly swollen. Osteoarthritic changes of the small  joints of the hands  Lymphadenopathy:    She has no cervical adenopathy.  Neurological: She is alert and oriented to person, place, and time.  Skin: Skin is warm and dry. No rash noted.  Psychiatric: She has a normal mood and affect. Her behavior is normal.          Assessment & Plan:   Osteoarthritis.  Will refer to rheumatology as requested.  She has requested referral to Dr. Letta Pate [facility] in [place] Left olecranon bursitis  Procedure note.  After informed consent and local prep with alcohol swab.  1 cc of 80 mg of Depo-Medrol with 1 point 5 cc of lidocaine injected into the  left olecranon bursa without difficulty.  A Band-Aid applied

## 2014-03-01 ENCOUNTER — Ambulatory Visit (INDEPENDENT_AMBULATORY_CARE_PROVIDER_SITE_OTHER): Payer: Medicare Other | Admitting: Internal Medicine

## 2014-03-01 ENCOUNTER — Encounter: Payer: Self-pay | Admitting: Internal Medicine

## 2014-03-01 ENCOUNTER — Other Ambulatory Visit: Payer: Self-pay | Admitting: Internal Medicine

## 2014-03-01 VITALS — BP 138/90 | HR 62 | Temp 98.1°F | Resp 18 | Ht 65.5 in | Wt 123.0 lb

## 2014-03-01 DIAGNOSIS — M7022 Olecranon bursitis, left elbow: Secondary | ICD-10-CM

## 2014-03-01 DIAGNOSIS — M7020 Olecranon bursitis, unspecified elbow: Secondary | ICD-10-CM

## 2014-03-01 DIAGNOSIS — M1990 Unspecified osteoarthritis, unspecified site: Secondary | ICD-10-CM

## 2014-03-01 MED ORDER — TRAMADOL HCL 50 MG PO TABS
50.0000 mg | ORAL_TABLET | Freq: Three times a day (TID) | ORAL | Status: DC | PRN
Start: 2014-03-01 — End: 2014-08-17

## 2014-03-01 MED ORDER — CEPHALEXIN 500 MG PO CAPS: 500.0000 mg | ORAL_CAPSULE | Freq: Four times a day (QID) | ORAL | Status: DC

## 2014-03-01 NOTE — Patient Instructions (Signed)
Olecranon Bursitis   A bursa is a fluid-filled sac that covers and protects a joint. Bursitis is when the fluid-filled sac gets puffy and sore (inflammed). Olecranon bursitis occurs over the elbow. It is often caused by falling on the elbow, a joint disorder (arthritis), or infection. It can also be caused by overuse of the elbow joint.  HOME CARE  · Put ice on the affected area.  · Put ice in a plastic bag.  · Place a towel between your skin and the bag.  · Leave the ice on for 15-20 minutes each hour while awake. Do this for the first 2 days.  · Raise (elevate) your elbow above your heart when resting.  · Bend, straighten, and move your joint 4 times a day. Rest the injured joint at other times. When you have less pain, begin slow movements and usual activities.  · Only take medicine as told by your doctor.  · Limit the amount of dairy you eat and drink (milk, cheese, yogurt).  GET HELP RIGHT AWAY IF:   · You have more pain even with treatment.  · You have a fever.  · You have warmth and irritation over the fluid-filled sac and elbow.  · You have a red line going up your arm.  · You have pain when you move your elbow.  MAKE SURE YOU:   · Understand these instructions.  · Will watch your condition.  · Will get help right away if you are not doing well or get worse.  Document Released: 05/09/2010 Document Revised: 02/11/2012 Document Reviewed: 05/09/2010  [employer]® Patient Information ©2014 [employer].

## 2014-03-01 NOTE — Progress Notes (Signed)
Subjective:    Patient ID: [name], female    DOB: 10/25/1941, 73 y.o.   MRN: [id number]  HPI  73 year old patient who returns for followup.  She was seen last week with the mild left olecranon bursitis.  Symptoms have been present for least 2 months.  She was here with a cortisone injection.  Today she feels the left elbow has worsened with increasing pain and swelling.  There's been no fever or other constitutional complaints.  She does have osteoarthritis and does have a rheumatology appointment scheduled in June  Past Medical History  Diagnosis Date  . Coronary atherosclerosis of unspecified type of vessel, native or graft   . Other and unspecified hyperlipidemia   . Backache, unspecified   . Raynaud's syndrome   . Personal history of other diseases of digestive system   . Diverticulosis of colon (without mention of hemorrhage)   . Disorder of bone and cartilage, unspecified   . Diverticulosis of colon (without mention of hemorrhage)   . Hemorrhage of gastrointestinal tract, unspecified   . Unspecified hypothyroidism   . 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 Urinary tract infection, site not specified   . Urinary frequency   . Personal history of colonic polyps     sees Dr. Ardis Hughs, gastro  . Hypothyroidism   . Anxiety state, unspecified     no per pt  . Arthritis   . Blood transfusion without reported diagnosis   . Ulcer   . Myocardial infarction     2009sees Dr. Burt Knack, saw last 02/2012  . Bronchitis     hx of  . Cough     "patient is a smoker"  . Pneumonia   . Anemia     hx of  . Diverticulitis     hx of  . Chronic back pain   . Unspecified essential hypertension     sees Dr. Bluford Kaufmann [phone] at [facility]    History   Social History  . Marital Status: Married    Spouse Name: N/A    Number of Children: N/A  . 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 Smoking status: Current Every Day Smoker -- 0.50 packs/day for 40 years    Types: Cigarettes  . Smokeless tobacco: Never Used  . Alcohol Use: No  . Drug Use: No  . Sexual Activity: Not on file   Other Topics Concern  . Not on file   Social History Narrative  . No narrative on file    Past Surgical History  Procedure Laterality Date  . Gastrointestinal stomal tumor resection    . Cholecystectomy    . Tubal ligation    . Abdominal hysterectomy    . Appendectomy    . Coronary angioplasty with stent placement      x1, 2007or 2008  . Abdominal hysterectomy    . Partial colectomy  12/23/2012  . Laparoscopic partial colectomy  12/23/2012    Procedure: LAPAROSCOPIC PARTIAL COLECTOMY;  Surgeon: Stark Klein, MD;  Location: [facility] OR;  Service: General;  Laterality: N/A;    Family History  Problem Relation Age of Onset  . Heart disease Mother     Verdie Drown  . Lung cancer Father     SISTER, UNCLE  . Cerebral aneurysm Sister     UNCLE NEPHEW  . Colon cancer Neg Hx   . Esophageal cancer  Neg Hx   . Stomach cancer Neg Hx   . Rectal cancer Neg Hx     No Known Allergies  Current Outpatient Prescriptions on File Prior to Visit  Medication Sig Dispense Refill  . aspirin 81 MG tablet Take 81 mg by mouth daily.        Marland Kitchen levothyroxine (SYNTHROID, LEVOTHROID) 125 MCG tablet Take 1 tablet (125 mcg total) by mouth daily.  90 tablet  1  . lisinopril (PRINIVIL,ZESTRIL) 20 MG tablet TAKE ONE TABLET BY MOUTH ONE TIME DAILY  90 tablet  3  . metoprolol succinate (TOPROL-XL) 50 MG 24 hr tablet TAKE 1 TABLET BY MOUTH EVERY DAY  90 tablet  1  . nitroGLYCERIN (NITROSTAT) 0.4 MG SL tablet Place 1 tablet (0.4 mg total) under the tongue every 5 (five) minutes as needed for chest pain.  25 tablet  3   No current facility-administered medications on file prior to visit.    BP 138/90  Pulse 62  Temp(Src) 98.1 F (36.7 C) (Oral)  Resp 18  Ht 5' 5.5" (1.664 m)  Wt 123 lb (55.792 kg)  BMI  20.15 kg/m2  SpO2 95%       Review of Systems  Musculoskeletal: Positive for arthralgias and joint swelling.       Objective:   Physical Exam  Constitutional: She appears well-developed and well-nourished. No distress.  Musculoskeletal: She exhibits edema and tenderness.  The left elbow was examined.  There is mild soft tissue swelling over the left olecranon, which was quite tender.  No erythema or excessive warmth noted          Assessment & Plan:   Clinically, appears to be a mild left olecranon bursitis.  This has not responded to an injection of Depo-Medrol.  The patient feels it has worsened with more pain and swelling although no striking signs of active infection.  Procedure note.  After Betadine and alcohol prep and local anesthesia with 1% Xylocaine, the left olecranon bursa was aspirated with 1 cc of fairly clear fluid obtained.  This was sent for culture  Possible early olecranon bursitis secondary to infection or possible early cellulitis over the olecranon region.  Specimen sent for culture.  We'll treat with cephalexin for 7 days and observe.  Prescription for Ultram dispensed

## 2014-03-01 NOTE — Progress Notes (Signed)
Pre-visit discussion using our clinic review tool. No additional management support is needed unless otherwise documented below in the visit note.  

## 2014-03-02 DIAGNOSIS — M7020 Olecranon bursitis, unspecified elbow: Secondary | ICD-10-CM | POA: Diagnosis not present

## 2014-03-04 ENCOUNTER — Telehealth: Payer: Self-pay | Admitting: *Deleted

## 2014-03-04 NOTE — Telephone Encounter (Signed)
Spoke to pt told her culture came back showing no growth. Pt verbalized understanding.

## 2014-06-17 ENCOUNTER — Other Ambulatory Visit: Payer: Self-pay | Admitting: Internal Medicine

## 2014-06-22 ENCOUNTER — Encounter: Payer: Self-pay | Admitting: Internal Medicine

## 2014-06-22 ENCOUNTER — Ambulatory Visit (INDEPENDENT_AMBULATORY_CARE_PROVIDER_SITE_OTHER): Payer: Medicare Other | Admitting: Internal Medicine

## 2014-06-22 VITALS — BP 102/70 | HR 58 | Temp 98.0°F | Resp 18 | Ht 65.5 in | Wt 119.0 lb

## 2014-06-22 DIAGNOSIS — R634 Abnormal weight loss: Secondary | ICD-10-CM | POA: Diagnosis not present

## 2014-06-22 DIAGNOSIS — G8929 Other chronic pain: Secondary | ICD-10-CM

## 2014-06-22 DIAGNOSIS — D126 Benign neoplasm of colon, unspecified: Secondary | ICD-10-CM | POA: Diagnosis not present

## 2014-06-22 DIAGNOSIS — E039 Hypothyroidism, unspecified: Secondary | ICD-10-CM

## 2014-06-22 DIAGNOSIS — M810 Age-related osteoporosis without current pathological fracture: Secondary | ICD-10-CM

## 2014-06-22 DIAGNOSIS — I2510 Atherosclerotic heart disease of native coronary artery without angina pectoris: Secondary | ICD-10-CM

## 2014-06-22 DIAGNOSIS — R1011 Right upper quadrant pain: Secondary | ICD-10-CM

## 2014-06-22 DIAGNOSIS — I10 Essential (primary) hypertension: Secondary | ICD-10-CM | POA: Diagnosis not present

## 2014-06-22 LAB — COMPREHENSIVE METABOLIC PANEL
ALBUMIN: 3.9 g/dL (ref 3.5–5.2)
ALK PHOS: 82 U/L (ref 39–117)
ALT: 12 U/L (ref 0–35)
AST: 19 U/L (ref 0–37)
BUN: 10 mg/dL (ref 6–23)
CO2: 28 mEq/L (ref 19–32)
CREATININE: 0.8 mg/dL (ref 0.4–1.2)
Calcium: 9.5 mg/dL (ref 8.4–10.5)
Chloride: 104 mEq/L (ref 96–112)
GFR: 70.84 mL/min (ref >60.00)
GLUCOSE: 74 mg/dL (ref 70–99)
POTASSIUM: 4.3 meq/L (ref 3.5–5.1)
Sodium: 138 mEq/L (ref 135–145)
Total Bilirubin: 0.6 mg/dL (ref 0.2–1.2)
Total Protein: 6.6 g/dL (ref 6.0–8.3)

## 2014-06-22 LAB — TSH: TSH: 1.17 u[IU]/mL (ref 0.35–4.50)

## 2014-06-22 LAB — CBC WITH DIFFERENTIAL/PLATELET
BASOS PCT: 0.3 % (ref 0.0–3.0)
Basophils Absolute: 0 10*3/uL (ref 0.0–0.1)
EOS PCT: 1.7 % (ref 0.0–5.0)
Eosinophils Absolute: 0.1 10*3/uL (ref 0.0–0.7)
HEMATOCRIT: 41.9 % (ref 36.0–46.0)
Hemoglobin: 14.2 g/dL (ref 12.0–15.0)
Lymphocytes Relative: 36.1 % (ref 12.0–46.0)
Lymphs Abs: 2.5 10*3/uL (ref 0.7–4.0)
MCHC: 33.8 g/dL (ref 30.0–36.0)
MCV: 94.6 fl (ref 78.0–100.0)
MONOS PCT: 8 % (ref 3.0–12.0)
Monocytes Absolute: 0.6 10*3/uL (ref 0.1–1.0)
Neutro Abs: 3.8 10*3/uL (ref 1.4–7.7)
Neutrophils Relative %: 53.9 % (ref 43.0–77.0)
Platelets: 196 10*3/uL (ref 150.0–400.0)
RBC: 4.42 Mil/uL (ref 3.87–5.11)
RDW: 13.7 % (ref 11.5–15.5)
WBC: 7.1 10*3/uL (ref 4.0–10.5)

## 2014-06-22 LAB — SEDIMENTATION RATE: SED RATE: 5 mm/h (ref 0–22)

## 2014-06-22 MED ORDER — PROMETHAZINE HCL 25 MG PO TABS: 25.0000 mg | ORAL_TABLET | Freq: Three times a day (TID) | ORAL | Status: DC | PRN

## 2014-06-22 MED ORDER — VITAMIN D (ERGOCALCIFEROL) 1.25 MG (50000 UNIT) PO CAPS
50000.0000 [IU] | ORAL_CAPSULE | ORAL | Status: DC
Start: 2014-06-22 — End: 2016-03-29

## 2014-06-22 MED ORDER — OMEPRAZOLE 40 MG PO CPDR: 40.0000 mg | DELAYED_RELEASE_CAPSULE | Freq: Every day | ORAL | Status: DC

## 2014-06-22 NOTE — Progress Notes (Signed)
Pre visit review using our clinic review tool, if applicable. No additional management support is needed unless otherwise documented below in the visit note. 

## 2014-06-22 NOTE — Patient Instructions (Addendum)
Avoids foods high in acid such as tomatoes citrus juices, and spicy foods.  Avoid eating within two hours of lying down or before exercising.  Do not overheat.  Try smaller more frequent meals.  If symptoms persist, elevate the head of her bed 12 inches while sleeping.  GI followup as scheduled   Return in one month for follow-up   Take a calcium supplement, plus [phone] units of vitamin D .

## 2014-06-22 NOTE — Progress Notes (Signed)
Subjective:    Patient ID: [name], female    DOB: August 05, 1941, 73 y.o.   MRN: [id number]  HPI 73 year old patient who is seen today for followup.  Her chief complaint is right upper quadrant pain.  She states that this has been present since her partial colectomy in January of 2014.  She also complains of nausea, anorexia, and weight loss. The nausea and poor appetite had been present about 2 months.  Weight is down 4 pounds over the past 4 months. She is scheduled for GI followup.  Wt Readings from Last 3 Encounters:  06/22/14 119 lb (53.978 kg)  03/01/14 123 lb (55.792 kg)  02/25/14 126 lb (57.153 kg)    Past Medical History  Diagnosis Date  . Coronary atherosclerosis of unspecified type of vessel, native or graft   . Other and unspecified hyperlipidemia   . Backache, unspecified   . Raynaud's syndrome   . Personal history of other diseases of digestive system   . Diverticulosis of colon (without mention of hemorrhage)   . Disorder of bone and cartilage, unspecified   . Diverticulosis of colon (without mention of hemorrhage)   . Hemorrhage of gastrointestinal tract, unspecified   . Unspecified hypothyroidism   . 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 Urinary tract infection, site not specified   . Urinary frequency   . Personal history of colonic polyps     sees Dr. Ardis Hughs, gastro  . Hypothyroidism   . Anxiety state, unspecified     no per pt  . Arthritis   . Blood transfusion without reported diagnosis   . Ulcer   . Myocardial infarction     2009sees Dr. Burt Knack, saw last 02/2012  . Bronchitis     hx of  . Cough     "patient is a smoker"  . Pneumonia   . Anemia     hx of  . Diverticulitis     hx of  . Chronic back pain   . Unspecified essential hypertension     sees Dr. Bluford Kaufmann [phone] at [facility]    History   Social History  . Marital Status: Married    Spouse Name: N/A    Number of Children: N/A    . 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 Smoking status: Current Every Day Smoker -- 0.50 packs/day for 40 years    Types: Cigarettes  . Smokeless tobacco: Never Used  . Alcohol Use: No  . Drug Use: No  . Sexual Activity: Not on file   Other Topics Concern  . Not on file   Social History Narrative  . No narrative on file    Past Surgical History  Procedure Laterality Date  . Gastrointestinal stomal tumor resection    . Cholecystectomy    . Tubal ligation    . Abdominal hysterectomy    . Appendectomy    . Coronary angioplasty with stent placement      x1, 2007or 2008  . Abdominal hysterectomy    . Partial colectomy  12/23/2012  . Laparoscopic partial colectomy  12/23/2012    Procedure: LAPAROSCOPIC PARTIAL COLECTOMY;  Surgeon: Stark Klein, MD;  Location: [facility] OR;  Service: General;  Laterality: N/A;    Family History  Problem Relation Age of Onset  . Heart disease Mother     Verdie Drown  . Lung cancer Father  SISTER, UNCLE  . Cerebral aneurysm Sister     UNCLE NEPHEW  . Colon cancer Neg Hx   . Esophageal cancer Neg Hx   . Stomach cancer Neg Hx   . Rectal cancer Neg Hx     No Known Allergies  Current Outpatient Prescriptions on File Prior to Visit  Medication Sig Dispense Refill  . aspirin 81 MG tablet Take 81 mg by mouth daily.        Marland Kitchen lisinopril (PRINIVIL,ZESTRIL) 20 MG tablet TAKE ONE TABLET BY MOUTH ONE TIME DAILY  90 tablet  3  . metoprolol succinate (TOPROL-XL) 50 MG 24 hr tablet TAKE 1 TABLET BY MOUTH EVERY DAY  90 tablet  1  . nitroGLYCERIN (NITROSTAT) 0.4 MG SL tablet Place 1 tablet (0.4 mg total) under the tongue every 5 (five) minutes as needed for chest pain.  25 tablet  3  . SYNTHROID 125 MCG tablet TAKE 1 TABLET BY MOUTH ONCE DAILY  90 tablet  1  . traMADol (ULTRAM) 50 MG tablet Take 1 tablet (50 mg total) by mouth every 8 (eight) hours as needed.  90 tablet  3   No current  facility-administered medications on file prior to visit.    BP 102/70  Pulse 58  Temp(Src) 98 F (36.7 C) (Oral)  Resp 18  Ht 5' 5.5" (1.664 m)  Wt 119 lb (53.978 kg)  BMI 19.49 kg/m2  SpO2 96%     Review of Systems  Constitutional: Positive for appetite change, fatigue and unexpected weight change.  HENT: Negative for congestion, dental problem, hearing loss, rhinorrhea, sinus pressure, sore throat and tinnitus.   Eyes: Negative for pain, discharge and visual disturbance.  Respiratory: Negative for cough and shortness of breath.   Cardiovascular: Negative for chest pain, palpitations and leg swelling.  Gastrointestinal: Positive for nausea and abdominal pain. Negative for vomiting, diarrhea, constipation, blood in stool and abdominal distention.  Genitourinary: Negative for dysuria, urgency, frequency, hematuria, flank pain, vaginal bleeding, vaginal discharge, difficulty urinating, vaginal pain and pelvic pain.  Musculoskeletal: Negative for arthralgias, gait problem and joint swelling.  Skin: Negative for rash.  Neurological: Negative for dizziness, syncope, speech difficulty, weakness, numbness and headaches.  Hematological: Negative for adenopathy.  Psychiatric/Behavioral: Negative for behavioral problems, dysphoric mood and agitation. The patient is not nervous/anxious.        Objective:   Physical Exam  Constitutional: She is oriented to person, place, and time. She appears well-developed and well-nourished.  HENT:  Head: Normocephalic.  Right Ear: External ear normal.  Left Ear: External ear normal.  Mouth/Throat: Oropharynx is clear and moist.  Eyes: Conjunctivae and EOM are normal. Pupils are equal, round, and reactive to light.  Neck: Normal range of motion. Neck supple. No thyromegaly present.  Cardiovascular: Normal rate, regular rhythm, normal heart sounds and intact distal pulses.   Pulmonary/Chest: Effort normal and breath sounds normal.  Abdominal: Soft.  Bowel sounds are normal. She exhibits no distension and no mass. There is no tenderness. There is no rebound and no guarding.  Midline surgical scar No distention or significant tenderness  Musculoskeletal: Normal range of motion.  Lymphadenopathy:    She has no cervical adenopathy.  Neurological: She is alert and oriented to person, place, and time.  Skin: Skin is warm and dry. No rash noted.  Psychiatric: She has a normal mood and affect. Her behavior is normal.          Assessment & Plan:    Anorexia/weight loss.  We'll  check updated labs Right upper quadrant pain Hypertension stable CAD stable  GI followup Review lab  Will empirically place on PPI therapy Treat symptomatically

## 2014-06-23 ENCOUNTER — Telehealth: Payer: Self-pay | Admitting: Internal Medicine

## 2014-06-23 NOTE — Telephone Encounter (Signed)
Relevant patient education assigned to patient using [facility]. ° °

## 2014-06-28 ENCOUNTER — Telehealth: Payer: Self-pay | Admitting: Internal Medicine

## 2014-06-28 NOTE — Telephone Encounter (Signed)
Pt is requesting results from labs on 06/24/14.

## 2014-06-29 NOTE — Telephone Encounter (Signed)
Spoke to pt told her labs were normal. Pt verbalized understanding and asked what Vit d level was? Told pt he did not order Vit D level, but calcium was normal. Pt verbalized understanding.

## 2014-06-29 NOTE — Telephone Encounter (Signed)
Left message with husband to have pt call.

## 2014-07-21 DIAGNOSIS — H6120 Impacted cerumen, unspecified ear: Secondary | ICD-10-CM | POA: Diagnosis not present

## 2014-07-21 DIAGNOSIS — H903 Sensorineural hearing loss, bilateral: Secondary | ICD-10-CM | POA: Diagnosis not present

## 2014-07-21 DIAGNOSIS — H61309 Acquired stenosis of external ear canal, unspecified, unspecified ear: Secondary | ICD-10-CM | POA: Diagnosis not present

## 2014-08-17 ENCOUNTER — Ambulatory Visit (INDEPENDENT_AMBULATORY_CARE_PROVIDER_SITE_OTHER): Payer: Medicare Other | Admitting: Cardiovascular Disease

## 2014-08-17 ENCOUNTER — Encounter: Payer: Self-pay | Admitting: Cardiovascular Disease

## 2014-08-17 VITALS — BP 168/102 | HR 80 | Ht 65.5 in | Wt 114.4 lb

## 2014-08-17 DIAGNOSIS — I2510 Atherosclerotic heart disease of native coronary artery without angina pectoris: Secondary | ICD-10-CM

## 2014-08-17 DIAGNOSIS — I10 Essential (primary) hypertension: Secondary | ICD-10-CM

## 2014-08-17 MED ORDER — AMLODIPINE BESYLATE 5 MG PO TABS
5.0000 mg | ORAL_TABLET | Freq: Every day | ORAL | Status: DC
Start: 2014-08-17 — End: 2014-09-01

## 2014-08-17 NOTE — Patient Instructions (Addendum)
Your physician has requested that you have a renal artery duplex. During this test, an ultrasound is used to evaluate blood flow to the kidneys. Allow one hour for this exam. Do not eat after midnight the day before and avoid carbonated beverages. Take your medications as you usually do.  Your physician has recommended you make the following change in your medication: START Amlodipine 5mg  take one by mouth daily  Your physician recommends that you schedule a follow-up appointment in: 2 WEEKS for BP check  Your physician recommends that you return for a FASTING LIPID and LIVER profile in 2 WEEKS--nothing to eat or drink after midnight  Your physician wants you to follow-up in: 1 YEAR with Dr Burt Knack.  You will receive a reminder letter in the mail two months in advance. If you don't receive a letter, please call our office to schedule the follow-up appointment.

## 2014-08-17 NOTE — Progress Notes (Signed)
HPI:  73 year old woman presenting for followup evaluation. The patient has coronary artery disease. She initially presented with non-ST elevation infarction in 2008 and was treated with bare-metal stenting of the mid LAD.  Her most recent heart catheterization in 2013 showed patency of the stent in her mid LAD with diffuse nonobstructive disease in the right coronary artery and left circumflex vessels. The patient's LV function was normal. She returns today for followup.  The patient has not been doing very well. She's lost a great deal of weight ever since a right hemicolectomy in 2014. She states that "I just had no appetite." She has frequent diarrhea. She's had a single episode of chest discomfort associated with diaphoresis several weeks ago. She's had no other cardiac related symptoms. She specifically denies shortness of breath, edema, lightheadedness, syncope, or heart palpitations.  The patient's blood pressure is markedly elevated today. She's had a bad day. Her keys were locked in her car and she was running late for this appointment. She does not monitor her blood pressure at home. She is compliant with medications and takes lisinopril and metoprolol succinate at bedtime.   Outpatient Encounter Prescriptions as of 08/17/2014  Medication Sig  . aspirin 81 MG tablet Take 81 mg by mouth daily.    Marland Kitchen lisinopril (PRINIVIL,ZESTRIL) 20 MG tablet TAKE ONE TABLET BY MOUTH ONE TIME DAILY  . metoprolol succinate (TOPROL-XL) 50 MG 24 hr tablet TAKE 1 TABLET BY MOUTH EVERY DAY  . nitroGLYCERIN (NITROSTAT) 0.4 MG SL tablet Place 1 tablet (0.4 mg total) under the tongue every 5 (five) minutes as needed for chest pain.  Marland Kitchen SYNTHROID 125 MCG tablet TAKE 1 TABLET BY MOUTH ONCE DAILY  . Vitamin D, Ergocalciferol, (DRISDOL) 50000 UNITS CAPS capsule Take 1 capsule (50,000 Units total) by mouth every 7 (seven) days.  Marland Kitchen amLODipine (NORVASC) 5 MG tablet Take 1 tablet (5 mg total) by mouth daily.  .  [DISCONTINUED] omeprazole (PRILOSEC) 40 MG capsule Take 1 capsule (40 mg total) by mouth daily.  . [DISCONTINUED] promethazine (PHENERGAN) 25 MG tablet Take 1 tablet (25 mg total) by mouth every 8 (eight) hours as needed for nausea or vomiting.  . [DISCONTINUED] traMADol (ULTRAM) 50 MG tablet Take 1 tablet (50 mg total) by mouth every 8 (eight) hours as needed.    No Known Allergies  Past Medical History  Diagnosis Date  . Coronary atherosclerosis of unspecified type of vessel, native or graft   . Other and unspecified hyperlipidemia   . Backache, unspecified   . Raynaud's syndrome   . Personal history of other diseases of digestive system   . Diverticulosis of colon (without mention of hemorrhage)   . Disorder of bone and cartilage, unspecified   . Diverticulosis of colon (without mention of hemorrhage)   . Hemorrhage of gastrointestinal tract, unspecified   . Unspecified hypothyroidism   . 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 Urinary tract infection, site not specified   . Urinary frequency   . Personal history of colonic polyps     sees Dr. Ardis Hughs, gastro  . Hypothyroidism   . Anxiety state, unspecified     no per pt  . Arthritis   . Blood transfusion without reported diagnosis   . Ulcer   . Myocardial infarction     2009sees Dr. Burt Knack, saw last 02/2012  . Bronchitis     hx of  . Cough     "patient is a smoker"  .  Pneumonia   . Anemia     hx of  . Diverticulitis     hx of  . Chronic back pain   . Unspecified essential hypertension     sees Dr. Bluford Kaufmann [phone] at [facility]    ROS: Negative except as per HPI  BP 168/102  Pulse 80  Ht 5' 5.5" (1.664 m)  Wt 114 lb 6.4 oz (51.891 kg)  BMI 18.74 kg/m2  PHYSICAL EXAM: Pt is alert and oriented, thin woman in NAD HEENT: normal Neck: JVP - normal, carotids 2+= without bruits Lungs: CTA bilaterally CV: RRR without murmur or gallop Abd: soft, NT, Positive BS, no  hepatomegaly Ext: no C/C/E, distal pulses intact and equal Skin: warm/dry no rash  EKG:  Normal sinus rhythm 80 beats per minute, left axis deviation, otherwise within normal limits  ASSESSMENT AND PLAN: 1. Coronary artery disease, native vessel. Isolated episode of chest pain noted, but no pattern of angina. Continue with current medical therapy.  2. Hypertension. Blood pressure is uncontrolled. Even considering her stressful day, her blood pressure is markedly elevated. On my check it is 190/105. I recommended that she start amlodipine 5 mg daily. She will return in one week for a blood pressure check. Also we'll order a renal arterial Dopplers and she is at risk for renal artery stenosis with her cardiovascular disease and tobacco use.   3. Dyslipidemia. She has been unwilling to take statin drugs. We have discussed this extensively in the past. Considering her significant weight loss and poor nutritional status, will repeat a lipid panel to see where things stand. Last lipids 08/26/2013: Lipid Panel     Component Value Date/Time   CHOL 275* 08/26/2013 1041   TRIG 194.0* 08/26/2013 1041   HDL 63.50 08/26/2013 1041   CHOLHDL 4 08/26/2013 1041   VLDL 38.8 08/26/2013 1041   4. Tobacco abuse. Continued smoking unlikely to quit. She was counseled today.  5. Weight loss with anorexia. Evaluated by Dr Burnice Logan. Scheduled to see Dr Ardis Hughs next month.  Plan: Add amlodipine 5 mg daily Check renal arterial doppler BP check here in 1 week Follow-up with me in one year unless BP remains uncontrolled  Sherren Mocha MD 08/17/2014 4:35 PM

## 2014-08-25 ENCOUNTER — Ambulatory Visit: Payer: Medicare Other | Admitting: Cardiovascular Disease

## 2014-08-31 ENCOUNTER — Ambulatory Visit (HOSPITAL_COMMUNITY): Payer: Medicare Other | Attending: Cardiovascular Disease | Admitting: Cardiology

## 2014-08-31 ENCOUNTER — Ambulatory Visit (INDEPENDENT_AMBULATORY_CARE_PROVIDER_SITE_OTHER): Payer: Medicare Other | Admitting: *Deleted

## 2014-08-31 ENCOUNTER — Other Ambulatory Visit (INDEPENDENT_AMBULATORY_CARE_PROVIDER_SITE_OTHER): Payer: Medicare Other | Admitting: *Deleted

## 2014-08-31 VITALS — BP 158/84 | HR 74 | Resp 18 | Wt 113.1 lb

## 2014-08-31 DIAGNOSIS — I10 Essential (primary) hypertension: Secondary | ICD-10-CM

## 2014-08-31 DIAGNOSIS — I2510 Atherosclerotic heart disease of native coronary artery without angina pectoris: Secondary | ICD-10-CM

## 2014-08-31 DIAGNOSIS — E785 Hyperlipidemia, unspecified: Secondary | ICD-10-CM | POA: Diagnosis not present

## 2014-08-31 DIAGNOSIS — I701 Atherosclerosis of renal artery: Secondary | ICD-10-CM

## 2014-08-31 DIAGNOSIS — F17200 Nicotine dependence, unspecified, uncomplicated: Secondary | ICD-10-CM | POA: Insufficient documentation

## 2014-08-31 LAB — HEPATIC FUNCTION PANEL
ALT: 9 U/L (ref 0–35)
AST: 14 U/L (ref 0–37)
Albumin: 4 g/dL (ref 3.5–5.2)
Alkaline Phosphatase: 80 U/L (ref 39–117)
BILIRUBIN DIRECT: 0.1 mg/dL (ref 0.0–0.3)
BILIRUBIN TOTAL: 0.7 mg/dL (ref 0.2–1.2)
Total Protein: 6.6 g/dL (ref 6.0–8.3)

## 2014-08-31 LAB — LIPID PANEL
CHOL/HDL RATIO: 4
CHOLESTEROL: 188 mg/dL (ref 0–200)
HDL: 46.5 mg/dL (ref >39.00)
LDL Cholesterol: 115 mg/dL — ABNORMAL HIGH (ref 0–99)
NonHDL: 141.5
Triglycerides: 133 mg/dL (ref 0.0–149.0)
VLDL: 26.6 mg/dL (ref 0.0–40.0)

## 2014-08-31 NOTE — Progress Notes (Signed)
Renal artery duplex performed  

## 2014-08-31 NOTE — Progress Notes (Signed)
Patient arrived for BP check.  Review of systems and home medications completed with patient.  Patient st she has chronic back pain and stomach pain (going to GI doctor next month), but otherwise asymptomatic. Note to be routed to [place] and [place] for review.

## 2014-09-01 ENCOUNTER — Telehealth: Payer: Self-pay | Admitting: Cardiovascular Disease

## 2014-09-01 DIAGNOSIS — I10 Essential (primary) hypertension: Secondary | ICD-10-CM

## 2014-09-01 DIAGNOSIS — I2510 Atherosclerotic heart disease of native coronary artery without angina pectoris: Secondary | ICD-10-CM

## 2014-09-01 MED ORDER — AMLODIPINE BESYLATE 10 MG PO TABS: 10.0000 mg | ORAL_TABLET | Freq: Every day | ORAL | Status: DC

## 2014-09-01 NOTE — Telephone Encounter (Signed)
Sherren Mocha, MD Lurline Del, RN            I can't tell if anyone addressed her BP today from Pam's note. I would increase amlodipine to 10 mg daily. thx         Dr Burt Knack reviewed nurse visit and recommended medication change.  Pt aware and new Rx sent to pharmacy.

## 2014-09-01 NOTE — Telephone Encounter (Signed)
New message ° ° ° °Pt want test results. °

## 2014-09-20 ENCOUNTER — Other Ambulatory Visit (INDEPENDENT_AMBULATORY_CARE_PROVIDER_SITE_OTHER): Payer: Medicare Other

## 2014-09-20 ENCOUNTER — Encounter: Payer: Self-pay | Admitting: Gastroenterology

## 2014-09-20 ENCOUNTER — Ambulatory Visit (INDEPENDENT_AMBULATORY_CARE_PROVIDER_SITE_OTHER): Payer: Medicare Other | Admitting: Gastroenterology

## 2014-09-20 VITALS — BP 116/70 | HR 64 | Ht 65.5 in | Wt 114.8 lb

## 2014-09-20 DIAGNOSIS — D481 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nd other soft tissue: Secondary | ICD-10-CM

## 2014-09-20 DIAGNOSIS — C49A2 Gastrointestinal stromal tumor of stomach: Secondary | ICD-10-CM

## 2014-09-20 DIAGNOSIS — I2510 Atherosclerotic heart disease of native coronary artery without angina pectoris: Secondary | ICD-10-CM

## 2014-09-20 DIAGNOSIS — R109 Unspecified abdominal pain: Secondary | ICD-10-CM

## 2014-09-20 LAB — CREATININE, SERUM: Creatinine, Ser: 0.9 mg/dL (ref 0.4–1.2)

## 2014-09-20 LAB — BUN: BUN: 9 mg/dL (ref 6–23)

## 2014-09-20 MED ORDER — CHOLESTYRAMINE LIGHT 4 G PO PACK: 4.0000 g | PACK | Freq: Two times a day (BID) | ORAL | Status: DC

## 2014-09-20 NOTE — Patient Instructions (Addendum)
You have been given a separate informational sheet regarding your tobacco use, the importance of quitting and local resources to help you quit. You will be set up for a CT scan of abdomen and pelvis with IV and oral contrast for abdominal pain, h/o periampullary GIST also colon surgery.  You have been scheduled for a CT scan of the abdomen and pelvis at [facility] ([address]---this is in the same building as Press [facility]).   You are scheduled on 09/23/14 at 3 pm. You should arrive 15 minutes prior to your appointment time for registration. Please follow the written instructions below on the day of your exam:  WARNING: IF YOU ARE ALLERGIC TO IODINE/X-RAY DYE, PLEASE NOTIFY RADIOLOGY IMMEDIATELY AT [phone]! YOU WILL BE GIVEN A 13 HOUR PREMEDICATION PREP.  1) Do not eat or drink anything after 11 am (4 hours prior to your test) 2) You have been given 2 bottles of oral contrast to drink. The solution may taste  better if refrigerated, but do NOT add ice or any other liquid to this solution. Shake  well before drinking.    Drink 1 bottle of contrast @ 1 pm (2 hours prior to your exam)  Drink 1 bottle of contrast @ 2 pm (1 hour prior to your exam)  You may take any medications as prescribed with a small amount of water except for the following: Metformin, Glucophage, Glucovance, Avandamet, Riomet, Fortamet, Actoplus Met, Janumet, Glumetza or Metaglip. The above medications must be held the day of the exam AND 48 hours after the exam.  The purpose of you drinking the oral contrast is to aid in the visualization of your intestinal tract. The contrast solution may cause some diarrhea. Before your exam is started, you will be given a small amount of fluid to drink. Depending on your individual set of symptoms, you may also receive an intravenous injection of x-ray contrast/dye. Plan on being at [facility] for 30 minutes or long, depending on the type of exam you are having  performed.  This test typically takes 30-45 minutes to complete.  If you have any questions regarding your exam or if you need to reschedule, you may call the CT department at [phone] between the hours of 8:00 am and 5:00 pm, Monday-Friday.  Your physician has requested that you go to the basement for lab work before leaving today.  Call to report on you symptoms in 5-6 weeks.

## 2014-09-20 NOTE — Progress Notes (Signed)
Review of gastrointestinal problems:  1. Periampullary mass seen on endoscopy in April of 2008, 2-3 cm, somewhat ulcerated. Biopsy showed focal ulceration, but no granulomas or malignancy was identified. CT scan of abdomen and pelvis with IV and oral contrast also documented a 3.2 cm periampullary mass, enhancing. The patient was on Plavix at the time of EGD. Mucosal biopsy of the mass caused more than usual bleeding which was self-limited, although she did have some melena for 1-2 days. Follow-up endoscopic ultrasound on Apr 17, 2007, showed a 2.5 periampullary duodenal mass located 2 cm distal to the major papilla, quite vascular. It was oozing following multiple biopsies. Biopsies showed spindle cell proliferation (GIST versus reactive changes). Resection by Dr. Eugenia Pancoast 2008,with choledocotomy, CBD cannulation, pathologic diagnosis confirmed GIST. 2. Question sigmoid diverticulitis seen on CT scan. She completed a Cipro/Flagyl course. Was recommended to have a colonoscopy in the past but deferred.  3. Adenomatous colon polyps, colonoscopy January 2010(4 polyps, some piecemeal). recall colonoscopy at 6 month interval. Repeat colonoscopy July 2011 found no recurrent adenomatous polyps. Repeat colonoscopy 10/2012 found hepatix flexure residual polyp, indistinct margins, easy to locate due to previous [place]ing in 2010. S/p lap right colectomy Dr. Barry Dienes, 12/2012. 4. Right lower quadrant pain; 2011 CT scan, CBC, cemented all normal. EGD April 2054found normal postoperative stomach, mild gastritis which was negative for H. pylori on biopsies.   HPI: This is a very pleasant 73 year old woman whom I last saw about 2 years ago.   Weight 2 years ago 128 (114 today).  Her weight is down 14 pounds since that visit.  Since surgery, lap right colectomy.  Feels rolling sensation in her abdomen.  Twisting sensation in abdomen.  Diarrhea often. 2 or 3 times a day. This is different from prior to her surgery.  She also describes a very brisk gastrocolic reflex  12/8297 labs: cbc, cmet, sed rate all normal     Past Medical History  Diagnosis Date  . Coronary atherosclerosis of unspecified type of vessel, native or graft   . Other and unspecified hyperlipidemia   . Backache, unspecified   . Raynaud's syndrome   . Personal history of other diseases of digestive system   . Diverticulosis of colon (without mention of hemorrhage)   . Disorder of bone and cartilage, unspecified   . Diverticulosis of colon (without mention of hemorrhage)   . Hemorrhage of gastrointestinal tract, unspecified   . Unspecified hypothyroidism   . 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 Urinary tract infection, site not specified   . Urinary frequency   . Personal history of colonic polyps     sees Dr. Ardis Hughs, gastro  . Hypothyroidism   . Anxiety state, unspecified     no per pt  . Arthritis   . Blood transfusion without reported diagnosis   . Ulcer   . Myocardial infarction     2009sees Dr. Burt Knack, saw last 02/2012  . Bronchitis     hx of  . Cough     "patient is a smoker"  . Pneumonia   . Anemia     hx of  . Diverticulitis     hx of  . Chronic back pain   . Unspecified essential hypertension     sees Dr. Bluford Kaufmann [phone] at [facility]    Past Surgical History  Procedure Laterality Date  . Gastrointestinal stomal tumor resection    . Cholecystectomy    . Tubal ligation    .  Abdominal hysterectomy    . Appendectomy    . Coronary angioplasty with stent placement      x1, 2007or 2008  . Abdominal hysterectomy    . Partial colectomy  12/23/2012  . Laparoscopic partial colectomy  12/23/2012    Procedure: LAPAROSCOPIC PARTIAL COLECTOMY;  Surgeon: Stark Klein, MD;  Location: [facility];  Service: General;  Laterality: N/A;    Current Outpatient Prescriptions  Medication Sig Dispense Refill  . amLODipine (NORVASC) 10 MG tablet Take 1 tablet (10 mg total) by  mouth daily.  30 tablet  11  . aspirin 81 MG tablet Take 81 mg by mouth daily.        Marland Kitchen lisinopril (PRINIVIL,ZESTRIL) 20 MG tablet TAKE ONE TABLET BY MOUTH ONE TIME DAILY  90 tablet  3  . metoprolol succinate (TOPROL-XL) 50 MG 24 hr tablet TAKE 1 TABLET BY MOUTH EVERY DAY  90 tablet  1  . nitroGLYCERIN (NITROSTAT) 0.4 MG SL tablet Place 1 tablet (0.4 mg total) under the tongue every 5 (five) minutes as needed for chest pain.  25 tablet  3  . SYNTHROID 125 MCG tablet TAKE 1 TABLET BY MOUTH ONCE DAILY  90 tablet  1  . Vitamin D, Ergocalciferol, (DRISDOL) 50000 UNITS CAPS capsule Take 1 capsule (50,000 Units total) by mouth every 7 (seven) days.  12 capsule  1   No current facility-administered medications for this visit.    Allergies as of 09/20/2014  . (No Known Allergies)    Family History  Problem Relation Age of Onset  . Heart disease Mother     Verdie Drown  . Lung cancer Father     SISTER, UNCLE  . Cerebral aneurysm Sister     UNCLE NEPHEW  . Colon cancer Neg Hx   . Esophageal cancer Neg Hx   . Stomach cancer Neg Hx   . Rectal cancer Neg Hx     History   Social History  . Marital Status: Married    Spouse Name: N/A    Number of Children: N/A  . 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 Smoking status: Current Every Day Smoker -- 0.50 packs/day for 40 years    Types: Cigarettes  . Smokeless tobacco: Never Used  . Alcohol Use: No  . Drug Use: No  . Sexual Activity: Not on file   Other Topics Concern  . Not on file   Social History Narrative  . No narrative on file      Physical Exam: BP 116/70  Pulse 64  Ht 5' 5.5" (1.664 m)  Wt 114 lb 12.8 oz (52.073 kg)  BMI 18.81 kg/m2 Constitutional: generally well-appearing Psychiatric: alert and oriented x3 Abdomen: soft, nontender, nondistended, no obvious ascites, no peritoneal signs, normal bowel sounds     Assessment and plan: 73 y.o. female with  brisk gastrocolic reflex, loose stools since right hemicolectomy about 2 years ago for precancerous polyp that was unable to be removed endoscopically  Her terminal ileum, ileocecal bowel and segment of her right colon were removed 2 years ago. It does seem like she's had a change in her bowel habits since then. This is often related to lack of IC valve, bile acid related phenomenon. I'm going to start her on cholestyramine, 1 g packs, she will take 1 packet twice daily she will call to report on her response in 5-6 weeks. She's also having some pains in that side  of her body as well. Given her history of polyps, more importantly history of small bowel GIST tumor that was removed several years ago I'm going to have her get a repeat CT scan abdomen and pelvis.

## 2014-09-23 ENCOUNTER — Ambulatory Visit (INDEPENDENT_AMBULATORY_CARE_PROVIDER_SITE_OTHER)
Admission: RE | Admit: 2014-09-23 | Discharge: 2014-09-23 | Disposition: A | Discharge status: Home / Self Care | Payer: Medicare Other | Source: Ambulatory Visit | Attending: Gastroenterology | Admitting: Gastroenterology

## 2014-09-23 DIAGNOSIS — C49A2 Gastrointestinal stromal tumor of stomach: Secondary | ICD-10-CM

## 2014-09-23 DIAGNOSIS — D481 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nd other soft tissue: Secondary | ICD-10-CM | POA: Diagnosis not present

## 2014-09-23 DIAGNOSIS — I700 Atherosclerosis of aorta: Secondary | ICD-10-CM | POA: Diagnosis not present

## 2014-09-23 DIAGNOSIS — R109 Unspecified abdominal pain: Secondary | ICD-10-CM | POA: Diagnosis not present

## 2014-09-23 DIAGNOSIS — K5730 Diverticulosis of large intestine without perforation or abscess without bleeding: Secondary | ICD-10-CM | POA: Diagnosis not present

## 2014-09-23 DIAGNOSIS — Z8500 Personal history of malignant neoplasm of unspecified digestive organ: Secondary | ICD-10-CM | POA: Diagnosis not present

## 2014-09-23 MED ORDER — IOHEXOL 300 MG/ML  SOLN
100.0000 mL | Freq: Once | INTRAMUSCULAR | Status: AC | PRN
  Administered 2014-09-23: 100 mL via INTRAVENOUS

## 2014-10-11 ENCOUNTER — Telehealth: Payer: Self-pay | Admitting: Internal Medicine

## 2014-10-11 MED ORDER — LISINOPRIL 20 MG PO TABS: ORAL_TABLET | ORAL | Status: DC

## 2014-10-11 NOTE — Telephone Encounter (Signed)
Rx sent 

## 2014-10-11 NOTE — Telephone Encounter (Signed)
[id number] - [place], [place] - [address] AT [address] is requesting 90 day re-fill on lisinopril (PRINIVIL,ZESTRIL) 20 MG tablet

## 2014-10-26 ENCOUNTER — Other Ambulatory Visit: Payer: Self-pay | Admitting: Internal Medicine

## 2015-02-08 DIAGNOSIS — H6123 Impacted cerumen, bilateral: Secondary | ICD-10-CM | POA: Diagnosis not present

## 2015-02-08 DIAGNOSIS — H903 Sensorineural hearing loss, bilateral: Secondary | ICD-10-CM | POA: Diagnosis not present

## 2015-02-08 DIAGNOSIS — H61393 Other acquired stenosis of external ear canal, bilateral: Secondary | ICD-10-CM | POA: Diagnosis not present

## 2015-03-18 ENCOUNTER — Other Ambulatory Visit: Payer: Self-pay | Admitting: Internal Medicine

## 2015-03-25 ENCOUNTER — Other Ambulatory Visit: Payer: Self-pay | Admitting: *Deleted

## 2015-03-25 IMAGING — CT CT ABD-PELV W/ CM
2 of 5 series · 17 of 46 positions shown, 19 images · IV contrast (Omnipaque 300)
Comparison: CT 12/19/2012

CLINICAL DATA: Patient with history of gastrointestinal stromal

EXAM:
CT ABDOMEN AND PELVIS WITH CONTRAST
TECHNIQUE: Multidetector CT imaging of the abdomen and pelvis was performed
using the standard protocol following bolus administration of
intravenous contrast.
CONTRAST:  100mL OMNIPAQUE IOHEXOL 300 MG/ML  SOLN

[Series 2: abd/ pel 5mm · axial · 0.61mm/px · z∈[-474,-114]mm · 14 of 82 slices shown, 16 images]
[im 5/82  soft-tissue]
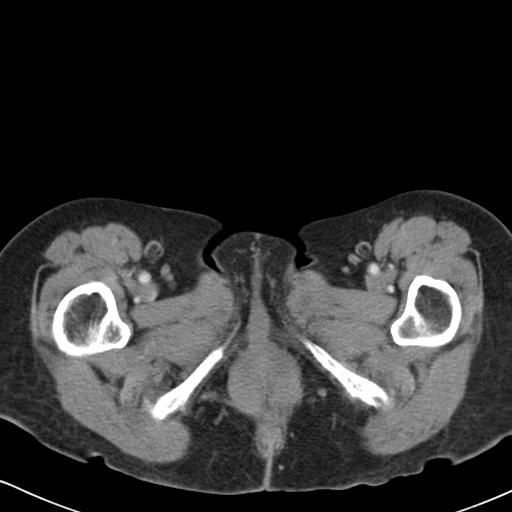
[im 5/82  bone]
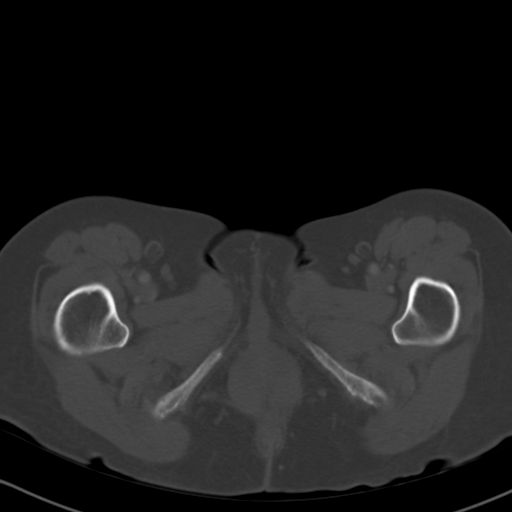
[im 9/82  soft-tissue]
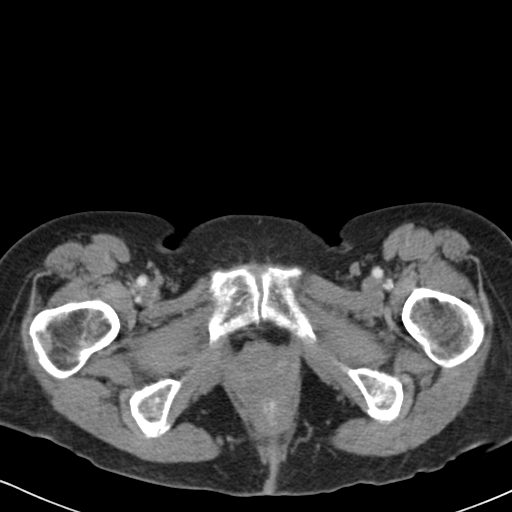
[im 18/82  soft-tissue]
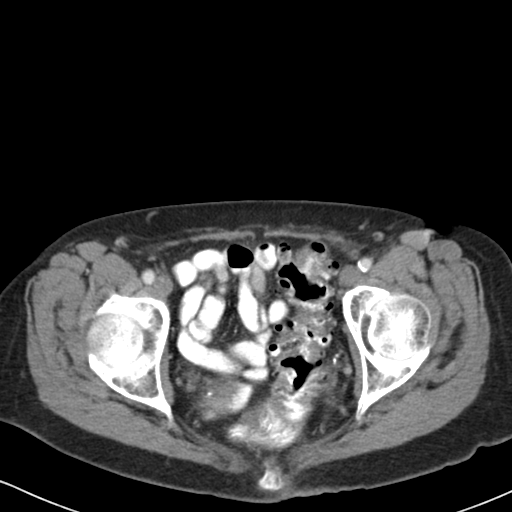
[im 22/82  soft-tissue]
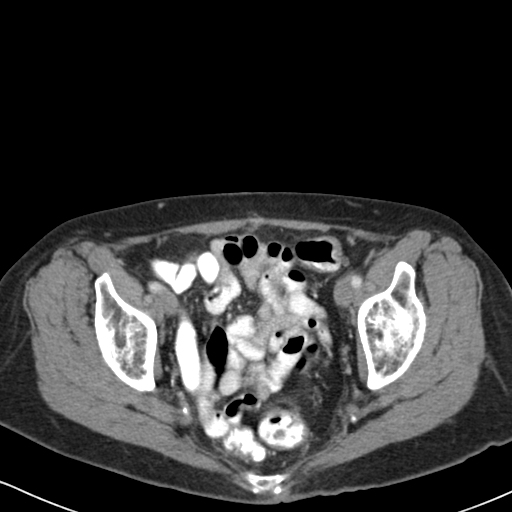
[im 26/82  soft-tissue]
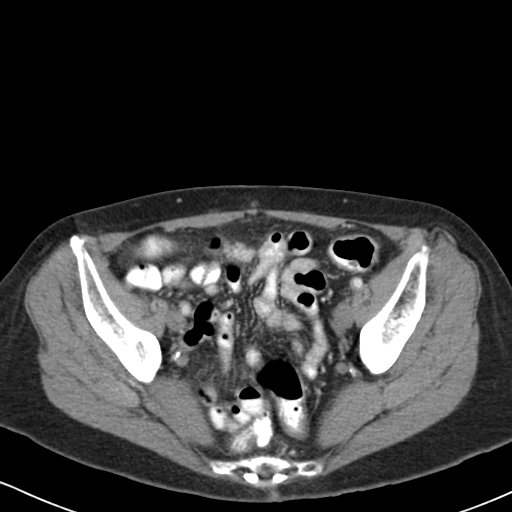
[im 35/82  soft-tissue]
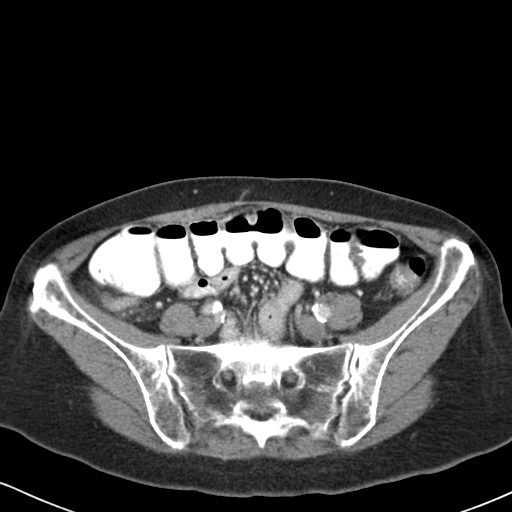
[im 39/82  soft-tissue]
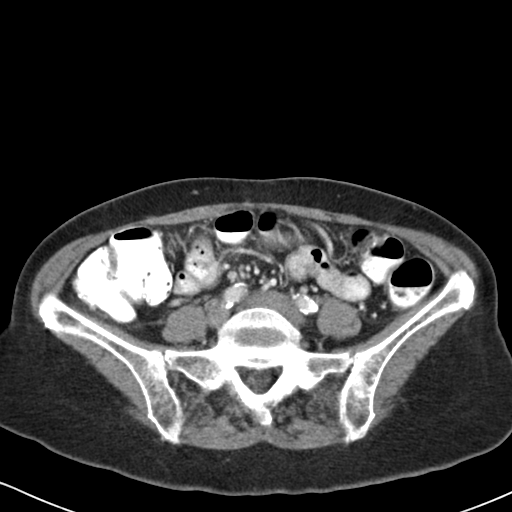
[im 43/82  soft-tissue]
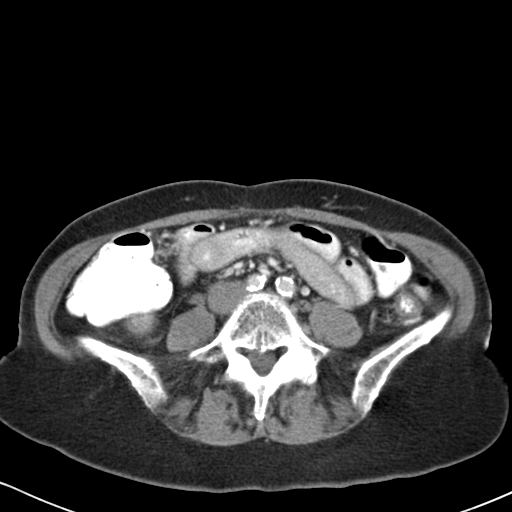
[im 47/82  soft-tissue]
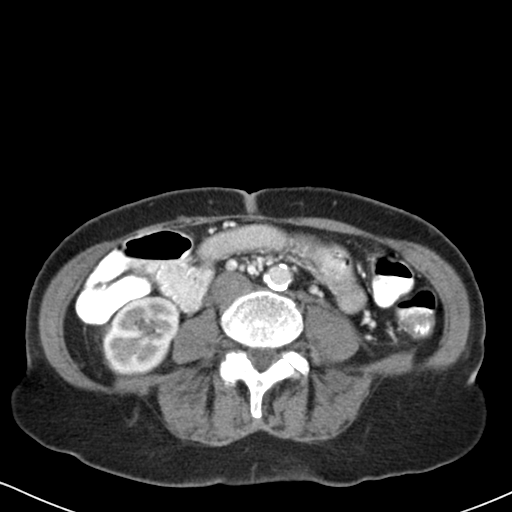
[im 47/82  bone]
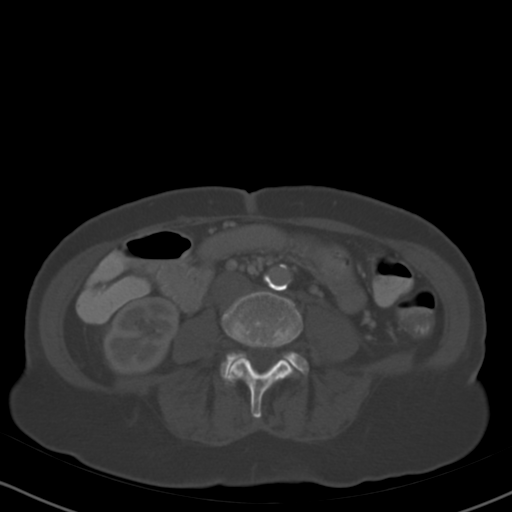
[im 56/82  soft-tissue]
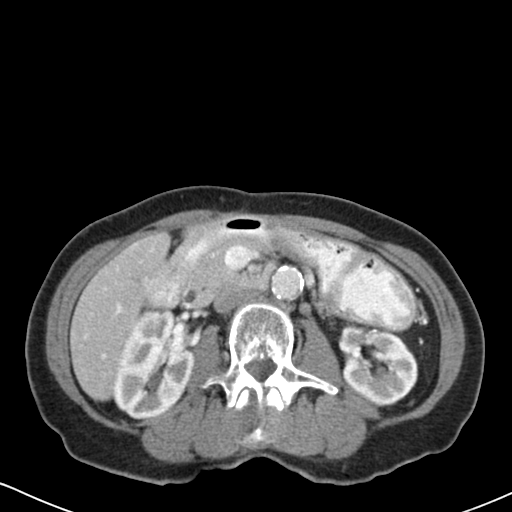
[im 60/82  soft-tissue]
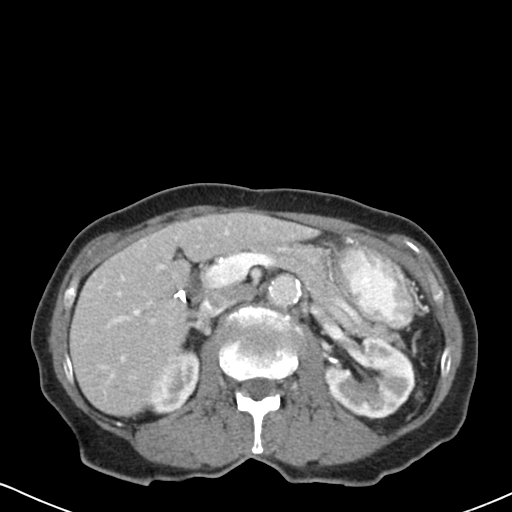
[im 64/82  soft-tissue]
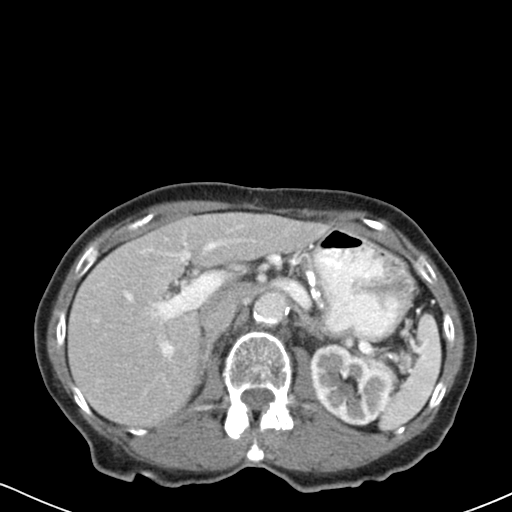
[im 73/82  soft-tissue]
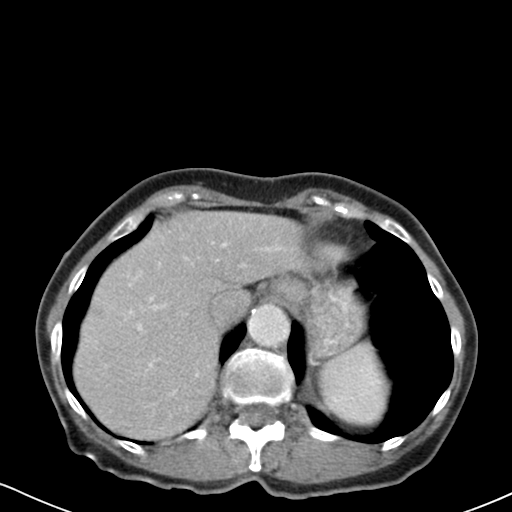
[im 77/82  soft-tissue]
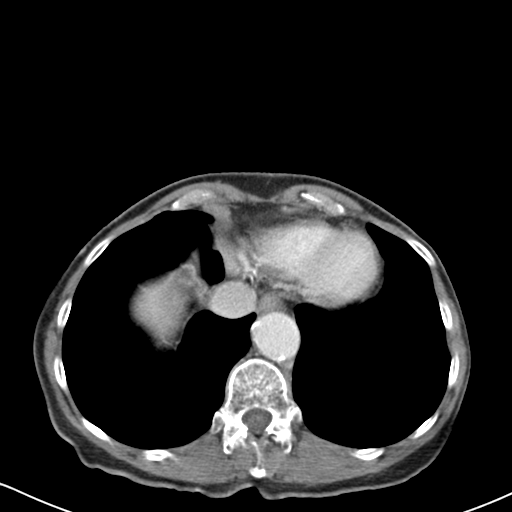

[Series 602: cor · coronal · 0.82mm/px · 3 of 89 slices shown]
[im 30/89  soft-tissue]
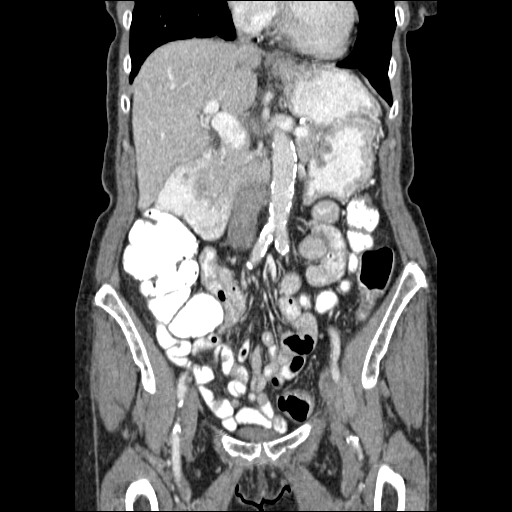
[im 40/89  soft-tissue]
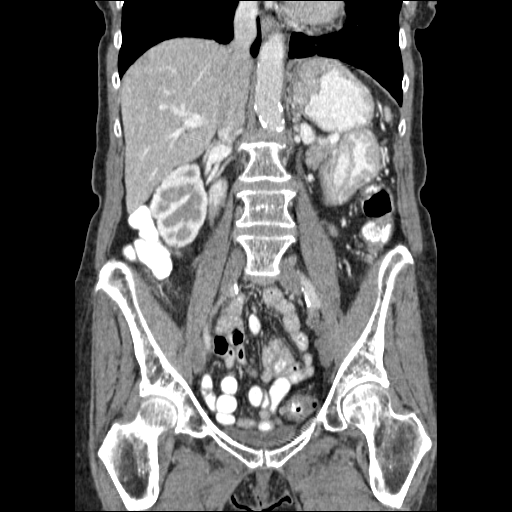
[im 49/89  soft-tissue]
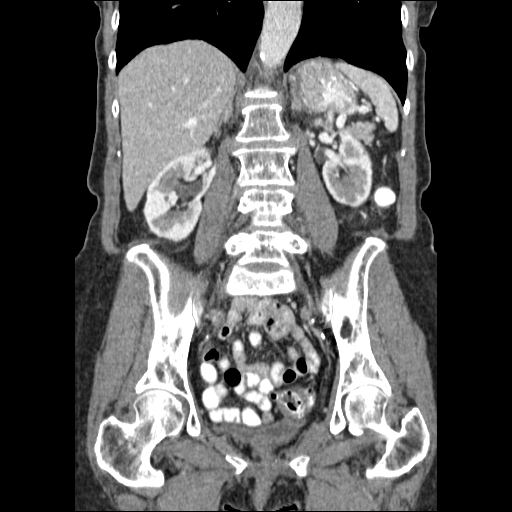

[17 of 46 positions shown; findings below may reference images not displayed]

FINDINGS: Lower chest: No consolidative or nodular pulmonary opacities. Normal
heart size.

Hepatobiliary: Liver is normal in size and contour without focal
hepatic lesion identified. Status post cholecystectomy. No
intrahepatic or extrahepatic biliary ductal dilatation.

Pancreas: Unremarkable

Spleen: Unremarkable

Adrenals/Urinary Tract: Normal adrenal glands. Kidneys enhance
symmetrically with contrast. Stable bilateral subcentimeter
low-attenuation renal lesions, too small to accurately characterize.
No hydronephrosis. Urinary bladder is unremarkable given
decompressed state.

Stomach/Bowel: Sigmoid colonic diverticulosis. No CT evidence for
acute diverticulitis. Oral contrast material throughout the small
enlarged bowel. No evidence for bowel wall thickening or bowel
obstruction.

Vascular/Lymphatic: Calcified atherosclerotic plaque involving the
abdominal aorta. No retroperitoneal lymphadenopathy.

Reproductive: Status post hysterectomy.

Other: None

Musculoskeletal: Lower lumbar spine degenerative changes. No
aggressive or acute appearing osseous lesions.
IMPRESSION: No acute findings.

Sigmoid colonic diverticulosis without evidence for acute
diverticulitis.

## 2015-03-25 MED ORDER — METOPROLOL SUCCINATE ER 50 MG PO TB24: ORAL_TABLET | ORAL | Status: DC

## 2015-04-14 ENCOUNTER — Ambulatory Visit (INDEPENDENT_AMBULATORY_CARE_PROVIDER_SITE_OTHER): Payer: Medicare Other | Admitting: Internal Medicine

## 2015-04-14 ENCOUNTER — Encounter: Payer: Self-pay | Admitting: Internal Medicine

## 2015-04-14 ENCOUNTER — Other Ambulatory Visit: Payer: Self-pay | Admitting: *Deleted

## 2015-04-14 DIAGNOSIS — I10 Essential (primary) hypertension: Secondary | ICD-10-CM | POA: Diagnosis not present

## 2015-04-14 DIAGNOSIS — Z720 Tobacco use: Secondary | ICD-10-CM

## 2015-04-14 DIAGNOSIS — M5489 Other dorsalgia: Secondary | ICD-10-CM

## 2015-04-14 DIAGNOSIS — I2510 Atherosclerotic heart disease of native coronary artery without angina pectoris: Secondary | ICD-10-CM

## 2015-04-14 MED ORDER — METHYLPREDNISOLONE ACETATE 80 MG/ML IJ SUSP
80.0000 mg | Freq: Once | INTRAMUSCULAR | Status: AC
  Administered 2015-04-14: 80 mg via INTRAMUSCULAR

## 2015-04-14 MED ORDER — HYDROCODONE-ACETAMINOPHEN 5-325 MG PO TABS: 1.0000 | ORAL_TABLET | Freq: Four times a day (QID) | ORAL | Status: DC | PRN

## 2015-04-14 MED ORDER — NITROGLYCERIN 0.4 MG SL SUBL: 0.4000 mg | SUBLINGUAL_TABLET | SUBLINGUAL | Status: DC | PRN

## 2015-04-14 MED ORDER — CYCLOBENZAPRINE HCL 5 MG PO TABS: 5.0000 mg | ORAL_TABLET | Freq: Three times a day (TID) | ORAL | Status: DC | PRN

## 2015-04-14 NOTE — Progress Notes (Signed)
Pre visit review using our clinic review tool, if applicable. No additional management support is needed unless otherwise documented below in the visit note. 

## 2015-04-14 NOTE — Patient Instructions (Signed)
Lumbosacral Strain Lumbosacral strain is a strain of any of the parts that make up your lumbosacral vertebrae. Your lumbosacral vertebrae are the bones that make up the lower third of your backbone. Your lumbosacral vertebrae are held together by muscles and tough, fibrous tissue (ligaments).  CAUSES  A sudden blow to your back can cause lumbosacral strain. Also, anything that causes an excessive stretch of the muscles in the low back can cause this strain. This is typically seen when people exert themselves strenuously, fall, lift heavy objects, bend, or crouch repeatedly. RISK FACTORS  Physically demanding work.  Participation in pushing or pulling sports or sports that require a sudden twist of the back (tennis, golf, baseball).  Weight lifting.  Excessive lower back curvature.  Forward-tilted pelvis.  Weak back or abdominal muscles or both.  Tight hamstrings. SIGNS AND SYMPTOMS  Lumbosacral strain may cause pain in the area of your injury or pain that moves (radiates) down your leg.  DIAGNOSIS Your health care provider can often diagnose lumbosacral strain through a physical exam. In some cases, you may need tests such as X-ray exams.  TREATMENT  Treatment for your lower back injury depends on many factors that your clinician will have to evaluate. However, most treatment will include the use of anti-inflammatory medicines. HOME CARE INSTRUCTIONS   Avoid hard physical activities (tennis, racquetball, waterskiing) if you are not in proper physical condition for it. This may aggravate or create problems.  If you have a back problem, avoid sports requiring sudden body movements. Swimming and walking are generally safer activities.  Maintain good posture.  Maintain a healthy weight.  For acute conditions, you may put ice on the injured area.  Put ice in a plastic bag.  Place a towel between your skin and the bag.  Leave the ice on for 20 minutes, 2-3 times a day.  When the  low back starts healing, stretching and strengthening exercises may be recommended. SEEK MEDICAL CARE IF:  Your back pain is getting worse.  You experience severe back pain not relieved with medicines. SEEK IMMEDIATE MEDICAL CARE IF:   You have numbness, tingling, weakness, or problems with the use of your arms or legs.  There is a change in bowel or bladder control.  You have increasing pain in any area of the body, including your belly (abdomen).  You notice shortness of breath, dizziness, or feel faint.  You feel sick to your stomach (nauseous), are throwing up (vomiting), or become sweaty.  You notice discoloration of your toes or legs, or your feet get very cold. MAKE SURE YOU:   Understand these instructions.  Will watch your condition.  Will get help right away if you are not doing well or get worse. Document Released: 08/29/2005 Document Revised: 11/24/2013 Document Reviewed: 07/08/2013 [facility] Patient Information 2015 [place], [place]. This information is not intended to replace advice given to you by your health care provider. Make sure you discuss any questions you have with your health care provider. Back Exercises These exercises may help you when beginning to rehabilitate your injury. Your symptoms may resolve with or without further involvement from your physician, physical therapist or athletic trainer. While completing these exercises, remember:   Restoring tissue flexibility helps normal motion to return to the joints. This allows healthier, less painful movement and activity.  An effective stretch should be held for at least 30 seconds.  A stretch should never be painful. You should only feel a gentle lengthening or release in the  stretched tissue. STRETCH - Extension, Prone on Elbows   Lie on your stomach on the floor, a bed will be too soft. Place your palms about shoulder width apart and at the height of your head.  Place your elbows under your shoulders.  If this is too painful, stack pillows under your chest.  Allow your body to relax so that your hips drop lower and make contact more completely with the floor.  Hold this position for __________ seconds.  Slowly return to lying flat on the floor. Repeat __________ times. Complete this exercise __________ times per day.  RANGE OF MOTION - Extension, Prone Press Ups   Lie on your stomach on the floor, a bed will be too soft. Place your palms about shoulder width apart and at the height of your head.  Keeping your back as relaxed as possible, slowly straighten your elbows while keeping your hips on the floor. You may adjust the placement of your hands to maximize your comfort. As you gain motion, your hands will come more underneath your shoulders.  Hold this position __________ seconds.  Slowly return to lying flat on the floor. Repeat __________ times. Complete this exercise __________ times per day.  RANGE OF MOTION- Quadruped, Neutral Spine   Assume a hands and knees position on a firm surface. Keep your hands under your shoulders and your knees under your hips. You may place padding under your knees for comfort.  Drop your head and point your tail bone toward the ground below you. This will round out your low back like an angry cat. Hold this position for __________ seconds.  Slowly lift your head and release your tail bone so that your back sags into a large arch, like an old horse.  Hold this position for __________ seconds.  Repeat this until you feel limber in your low back.  Now, find your "sweet spot." This will be the most comfortable position somewhere between the two previous positions. This is your neutral spine. Once you have found this position, tense your stomach muscles to support your low back.  Hold this position for __________ seconds. Repeat __________ times. Complete this exercise __________ times per day.  STRETCH - Flexion, Single Knee to Chest   Lie on a  firm bed or floor with both legs extended in front of you.  Keeping one leg in contact with the floor, bring your opposite knee to your chest. Hold your leg in place by either grabbing behind your thigh or at your knee.  Pull until you feel a gentle stretch in your low back. Hold __________ seconds.  Slowly release your grasp and repeat the exercise with the opposite side. Repeat __________ times. Complete this exercise __________ times per day.  STRETCH - Hamstrings, Standing  Stand or sit and extend your right / left leg, placing your foot on a chair or foot stool  Keeping a slight arch in your low back and your hips straight forward.  Lead with your chest and lean forward at the waist until you feel a gentle stretch in the back of your right / left knee or thigh. (When done correctly, this exercise requires leaning only a small distance.)  Hold this position for __________ seconds. Repeat __________ times. Complete this stretch __________ times per day. STRENGTHENING - Deep Abdominals, Pelvic Tilt   Lie on a firm bed or floor. Keeping your legs in front of you, bend your knees so they are both pointed toward the ceiling and your  feet are flat on the floor.  Tense your lower abdominal muscles to press your low back into the floor. This motion will rotate your pelvis so that your tail bone is scooping upwards rather than pointing at your feet or into the floor.  With a gentle tension and even breathing, hold this position for __________ seconds. Repeat __________ times. Complete this exercise __________ times per day.  STRENGTHENING - Abdominals, Crunches   Lie on a firm bed or floor. Keeping your legs in front of you, bend your knees so they are both pointed toward the ceiling and your feet are flat on the floor. Cross your arms over your chest.  Slightly tip your chin down without bending your neck.  Tense your abdominals and slowly lift your trunk high enough to just clear your  shoulder blades. Lifting higher can put excessive stress on the low back and does not further strengthen your abdominal muscles.  Control your return to the starting position. Repeat __________ times. Complete this exercise __________ times per day.  STRENGTHENING - Quadruped, Opposite UE/LE Lift   Assume a hands and knees position on a firm surface. Keep your hands under your shoulders and your knees under your hips. You may place padding under your knees for comfort.  Find your neutral spine and gently tense your abdominal muscles so that you can maintain this position. Your shoulders and hips should form a rectangle that is parallel with the floor and is not twisted.  Keeping your trunk steady, lift your right hand no higher than your shoulder and then your left leg no higher than your hip. Make sure you are not holding your breath. Hold this position __________ seconds.  Continuing to keep your abdominal muscles tense and your back steady, slowly return to your starting position. Repeat with the opposite arm and leg. Repeat __________ times. Complete this exercise __________ times per day. Document Released: 12/07/2005 Document Revised: 02/11/2012 Document Reviewed: 03/03/2009 [facility] Patient Information 2015 [place], [place]. This information is not intended to replace advice given to you by your health care provider. Make sure you discuss any questions you have with your health care provider.

## 2015-04-14 NOTE — Progress Notes (Signed)
Subjective:    Patient ID: [name], female    DOB: 05/08/1941, 74 y.o.   MRN: [id number]  HPI  Wt Readings from Last 3 Encounters:  04/14/15 110 lb (49.896 kg)  09/20/14 114 lb 12.8 oz (52.073 kg)  08/31/14 113 lb 1.9 oz (51.34 kg)   74 year old patient who has a history of lumbar osteoarthritis.  She presents with 2 day history of right lumbar pain.  She's had difficulty with movement due to the intense spasm in the right lumbar area.  No radicular symptoms. She has a history of essential hypertension which has been stable. She has been seen by GI recently due to abdominal pain and weight loss.  Abdominal CT scan unremarkable but did reveal significant lumbar osteoarthritic changes. She continues to smoke  Past Medical History  Diagnosis Date  . Coronary atherosclerosis of unspecified type of vessel, native or graft   . Other and unspecified hyperlipidemia   . Backache, unspecified   . Raynaud's syndrome   . Personal history of other diseases of digestive system   . Diverticulosis of colon (without mention of hemorrhage)   . Disorder of bone and cartilage, unspecified   . Diverticulosis of colon (without mention of hemorrhage)   . Hemorrhage of gastrointestinal tract, unspecified   . Unspecified hypothyroidism   . 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 Urinary tract infection, site not specified   . Urinary frequency   . Personal history of colonic polyps     sees Dr. Ardis Hughs, gastro  . Hypothyroidism   . Anxiety state, unspecified     no per pt  . Arthritis   . Blood transfusion without reported diagnosis   . Ulcer   . Myocardial infarction     2009sees Dr. Burt Knack, saw last 02/2012  . Bronchitis     hx of  . Cough     "patient is a smoker"  . Pneumonia   . Anemia     hx of  . Diverticulitis     hx of  . Chronic back pain   . Unspecified essential hypertension     sees Dr. Bluford Kaufmann [phone] at [facility]     History   Social History  . Marital Status: Married    Spouse Name: N/A  . Number of Children: N/A  . 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 Smoking status: Current Every Day Smoker -- 0.50 packs/day for 40 years    Types: Cigarettes  . Smokeless tobacco: Never Used  . Alcohol Use: No  . Drug Use: No  . Sexual Activity: Not on file   Other Topics Concern  . Not on file   Social History Narrative    Past Surgical History  Procedure Laterality Date  . Gastrointestinal stomal tumor resection    . Cholecystectomy    . Tubal ligation    . Abdominal hysterectomy    . Appendectomy    . Coronary angioplasty with stent placement      x1, 2007or 2008  . Abdominal hysterectomy    . Partial colectomy  12/23/2012  . Laparoscopic partial colectomy  12/23/2012    Procedure: LAPAROSCOPIC PARTIAL COLECTOMY;  Surgeon: Stark Klein, MD;  Location: [facility] OR;  Service: General;  Laterality: N/A;    Family History  Problem Relation Age of Onset  . Heart disease Mother     Verdie Drown  .  Lung cancer Father     SISTER, UNCLE  . Cerebral aneurysm Sister     UNCLE NEPHEW  . Colon cancer Neg Hx   . Esophageal cancer Neg Hx   . Stomach cancer Neg Hx   . Rectal cancer Neg Hx     No Known Allergies  Current Outpatient Prescriptions on File Prior to Visit  Medication Sig Dispense Refill  . amLODipine (NORVASC) 10 MG tablet Take 1 tablet (10 mg total) by mouth daily. 30 tablet 11  . aspirin 81 MG tablet Take 81 mg by mouth daily.      . cholestyramine light (PREVALITE) 4 G packet Take 1 packet (4 g total) by mouth 2 (two) times daily. 60 packet 11  . lisinopril (PRINIVIL,ZESTRIL) 20 MG tablet TAKE ONE TABLET BY MOUTH ONE TIME DAILY 90 tablet 3  . metoprolol succinate (TOPROL-XL) 50 MG 24 hr tablet TAKE ONE TABLET BY MOUTH DAILY WITH OR IMMEDIATELY FOLLOWING A MEAL. 90 tablet 1  . SYNTHROID 125 MCG tablet TAKE 1 TABLET BY  MOUTH ONCE DAILY 90 tablet 1  . Vitamin D, Ergocalciferol, (DRISDOL) 50000 UNITS CAPS capsule Take 1 capsule (50,000 Units total) by mouth every 7 (seven) days. 12 capsule 1  . nitroGLYCERIN (NITROSTAT) 0.4 MG SL tablet Place 1 tablet (0.4 mg total) under the tongue every 5 (five) minutes as needed for chest pain. 25 tablet 3   No current facility-administered medications on file prior to visit.    BP 130/90 mmHg  Pulse 73  Temp(Src) 97.7 F (36.5 C) (Oral)  Resp 18  Ht 5' 5.5" (1.664 m)  Wt 110 lb (49.896 kg)  BMI 18.02 kg/m2  SpO2 95%     Review of Systems  Constitutional: Positive for activity change, appetite change and unexpected weight change.  HENT: Negative for congestion, dental problem, hearing loss, rhinorrhea, sinus pressure, sore throat and tinnitus.   Eyes: Negative for pain, discharge and visual disturbance.  Respiratory: Negative for cough and shortness of breath.   Cardiovascular: Negative for chest pain, palpitations and leg swelling.  Gastrointestinal: Negative for nausea, vomiting, abdominal pain, diarrhea, constipation, blood in stool and abdominal distention.  Genitourinary: Negative for dysuria, urgency, frequency, hematuria, flank pain, vaginal bleeding, vaginal discharge, difficulty urinating, vaginal pain and pelvic pain.  Musculoskeletal: Positive for back pain and gait problem. Negative for joint swelling and arthralgias.  Skin: Negative for rash.  Neurological: Negative for dizziness, syncope, speech difficulty, weakness, numbness and headaches.  Hematological: Negative for adenopathy.  Psychiatric/Behavioral: Negative for behavioral problems, dysphoric mood and agitation. The patient is not nervous/anxious.        Objective:   Physical Exam  Constitutional: She is oriented to person, place, and time. She appears well-developed and well-nourished.  HENT:  Head: Normocephalic.  Right Ear: External ear normal.  Left Ear: External ear normal.   Mouth/Throat: Oropharynx is clear and moist.  Eyes: Conjunctivae and EOM are normal. Pupils are equal, round, and reactive to light.  Neck: Normal range of motion. Neck supple. No thyromegaly present.  Cardiovascular: Normal rate, regular rhythm, normal heart sounds and intact distal pulses.   Pulmonary/Chest: Effort normal.  Few scattered rhonchi  Abdominal: Soft. Bowel sounds are normal. She exhibits no mass. There is no tenderness.  Musculoskeletal: Normal range of motion.  Tenderness and spasm in the right lumbar area Negative straight leg test Full range of motion both hips  Lymphadenopathy:    She has no cervical adenopathy.  Neurological: She is alert and oriented  to person, place, and time.  Skin: Skin is warm and dry. No rash noted.  Psychiatric: She has a normal mood and affect. Her behavior is normal.          Assessment & Plan:  Right lumbar back pain.  Appears be more musculoligamentous Lumbar osteoarthritis Weight loss Ongoing tobacco use with COPD.  Will set up for low intensity chest CT  We'll treat with warm compresses, analgesics.  Treat with Depo-Medrol Schedule CPX

## 2015-04-14 NOTE — Addendum Note (Signed)
Addended by: Marian Sorrow on: 04/14/2015 09:15 AM   Modules accepted: Orders

## 2015-04-15 ENCOUNTER — Encounter: Payer: Self-pay | Admitting: Acute Care

## 2015-04-15 ENCOUNTER — Telehealth: Payer: Self-pay | Admitting: Acute Care

## 2015-04-15 ENCOUNTER — Other Ambulatory Visit: Payer: Self-pay | Admitting: Acute Care

## 2015-04-15 DIAGNOSIS — Z87891 Personal history of nicotine dependence: Secondary | ICD-10-CM

## 2015-04-15 NOTE — Telephone Encounter (Signed)
I spoke with [name] and confirmed her pack year smoking history.She qualifies for the Lung Cancer Screening. I have scheduled her for an appointment with me on 04/21/15 at 2 pm. She knows where the office is and verbalized understanding.

## 2015-04-15 NOTE — Telephone Encounter (Signed)
Patient called with her LD CT for Lung Cancer Screening schedule time and date. She has an appointment at the [facility] 04/25/15 at 1:30 pm. She verbalized understanding of the location of the scan and of the appointment time.She is scheduled for her SDMV with me on 04/21/15. She verbalized understanding of this location and appointment time also. She has my contact information should she have any additional questions.

## 2015-04-21 ENCOUNTER — Encounter: Payer: Self-pay | Admitting: Acute Care

## 2015-04-21 ENCOUNTER — Ambulatory Visit (INDEPENDENT_AMBULATORY_CARE_PROVIDER_SITE_OTHER): Payer: Medicare Other | Admitting: Acute Care

## 2015-04-21 DIAGNOSIS — Z87891 Personal history of nicotine dependence: Secondary | ICD-10-CM | POA: Diagnosis not present

## 2015-04-21 NOTE — Progress Notes (Signed)
Shared Decision Making Visit Lung Cancer Screening Program [phone])   Eligibility:  Age 74 y.o.  Pack Years Smoking History Calculation 30 pack years. .75 packs/day x 40 years (# packs/per year x # years smoked)  Recent History of coughing up blood  no  Unexplained weight loss? no ( >Than 15 pounds within the last 6 months )  Prior History Lung / other cancer no (Diagnosis within the last 5 years already requiring surveillance chest CT Scans).  Smoking Status Current Smoker  Former Smokers: Years since quit:N/A  Quit Date: N/A  Visit Components:  Discussion included one or more decision making aids. yes  Discussion included risk/benefits of screening. yes  Discussion included potential follow up diagnostic testing for abnormal scans. yes  Discussion included meaning and risk of over diagnosis. yes  Discussion included meaning and risk of False Positives. yes  Discussion included meaning of total radiation exposure. yes  Counseling Included:  Importance of adherence to annual lung cancer LDCT screening. yes  Impact of comorbidities on ability to participate in the program. yes  Ability and willingness to under diagnostic treatment. yes  Smoking Cessation Counseling:  Current Smokers:   Discussed importance of smoking cessation. yes  Information about tobacco cessation classes and interventions provided to patient. yes  Patient provided with "ticket" for LDCT Scan. yes  Symptomatic Patient. no  Counseling(Intermediate counseling: > three minutes) [id number]  Diagnosis Code: Tobacco Use Z72.0  Asymptomatic Patient yes  Counseling As above  Former Smokers:   Discussed the importance of maintaining cigarette abstinence. yes  Diagnosis Code: Personal History of Nicotine Dependence. K80.034  Information about tobacco cessation classes and interventions provided to patient. Yes  Patient provided with "ticket" for LDCT Scan. yes  Written Order for Lung  Cancer Screening with LDCT placed in Epic. Yes (CT Chest Lung Cancer Screening Low Dose W/O CM) [id number] Z12.2-Screening of respiratory organs Z87.891-Personal history of nicotine dependence  I spent 15 minutes of face to face time with [name] explaining the risks and benefits of the lung cancer screening program. We viewed a power point presentation and discussed all risks and benefits of the program for her as an individual.She was counseled on smoking cessation being the single most powerful thing she can do to decrease her risk of lung cancer.She is not ready at this point to quit. I gave her a quit card with all resources available in the community, and asked her to call me at the point in time that she is ready to quit smoking. I told her I will offer nicotine replacement therapy, non-nicotine medications, and support groups and resources when she is ready. We want to help her reach this goal.I gave her a copy of the power point to take home to refer to in case she has any questions. She has my card and contact numbers in case she has any questions.She verbalized understanding of her appointment for her LDCT at 1:30 on 04/25/15. She confirmed verbally that she knows  to go to [facility] for the scan. I feel she has good understanding of the risks and benefits of the screening program.  Magdalen Spatz, NP

## 2015-04-25 ENCOUNTER — Ambulatory Visit (HOSPITAL_BASED_OUTPATIENT_CLINIC_OR_DEPARTMENT_OTHER)
Admission: RE | Admit: 2015-04-25 | Discharge: 2015-04-25 | Disposition: A | Discharge status: Home / Self Care | Payer: Medicare Other | Source: Ambulatory Visit | Attending: Acute Care | Admitting: Acute Care

## 2015-04-25 ENCOUNTER — Other Ambulatory Visit: Payer: Self-pay

## 2015-04-25 ENCOUNTER — Telehealth: Payer: Self-pay

## 2015-04-25 DIAGNOSIS — R918 Other nonspecific abnormal finding of lung field: Secondary | ICD-10-CM | POA: Insufficient documentation

## 2015-04-25 DIAGNOSIS — I2510 Atherosclerotic heart disease of native coronary artery without angina pectoris: Secondary | ICD-10-CM | POA: Insufficient documentation

## 2015-04-25 DIAGNOSIS — F17210 Nicotine dependence, cigarettes, uncomplicated: Secondary | ICD-10-CM | POA: Insufficient documentation

## 2015-04-25 DIAGNOSIS — Z9049 Acquired absence of other specified parts of digestive tract: Secondary | ICD-10-CM | POA: Diagnosis not present

## 2015-04-25 DIAGNOSIS — Z87891 Personal history of nicotine dependence: Secondary | ICD-10-CM | POA: Diagnosis not present

## 2015-04-25 DIAGNOSIS — Z122 Encounter for screening for malignant neoplasm of respiratory organs: Secondary | ICD-10-CM | POA: Diagnosis not present

## 2015-04-25 DIAGNOSIS — I700 Atherosclerosis of aorta: Secondary | ICD-10-CM | POA: Insufficient documentation

## 2015-04-25 DIAGNOSIS — Z1231 Encounter for screening mammogram for malignant neoplasm of breast: Secondary | ICD-10-CM

## 2015-04-25 NOTE — Telephone Encounter (Signed)
Pt will call breast center to sch

## 2015-04-25 NOTE — Telephone Encounter (Signed)
Called and left a message for pt to return call about yearly mammogram.

## 2015-04-28 ENCOUNTER — Ambulatory Visit: Payer: BLUE CROSS/BLUE SHIELD

## 2015-04-28 ENCOUNTER — Telehealth: Payer: Self-pay | Admitting: Acute Care

## 2015-04-28 ENCOUNTER — Ambulatory Visit (INDEPENDENT_AMBULATORY_CARE_PROVIDER_SITE_OTHER): Payer: Medicare Other

## 2015-04-28 DIAGNOSIS — Z1231 Encounter for screening mammogram for malignant neoplasm of breast: Secondary | ICD-10-CM | POA: Diagnosis not present

## 2015-04-28 DIAGNOSIS — R928 Other abnormal and inconclusive findings on diagnostic imaging of breast: Secondary | ICD-10-CM | POA: Diagnosis not present

## 2015-04-28 NOTE — Telephone Encounter (Signed)
I called Dr. [place] Pink at his request to discuss this patient's scan. The scan done on 04/25/15 was incompatible with the Lung RADS/ Dynacad system, and after having IT attempt unsuccessfully to make the conversion, the decision was made to repeat the scan.The repeat scan done 04/28/15 is Lung Rads 2, benign, nodules with a very low liklihood of becoming clinically active cancer due to size and lack of growth.  We will re-scan the patient in 12 months, per the program guidelines.I will call her the results.

## 2015-04-28 NOTE — Telephone Encounter (Signed)
I called [name] to let her know the results of her LDCT lung cancer screening scan.It resulted as Lung RADS 2( Nodules with very low likelihood of becoming clinically active cancer due to size or lack of growth). I explained to her that she would not need another scan for 12 months, and that we would call her to schedule that scan in May of 2017. She verbalized understanding ,and has my contact information if she should need to get in touch with me for  any further questions.

## 2015-04-28 NOTE — Telephone Encounter (Signed)
Attempted to call patient with results. No answer. Left contact number for patient to call me back.

## 2015-04-29 ENCOUNTER — Other Ambulatory Visit: Payer: Self-pay | Admitting: Internal Medicine

## 2015-04-29 DIAGNOSIS — R928 Other abnormal and inconclusive findings on diagnostic imaging of breast: Secondary | ICD-10-CM

## 2015-05-04 ENCOUNTER — Ambulatory Visit
Admission: RE | Admit: 2015-05-04 | Discharge: 2015-05-04 | Disposition: A | Discharge status: Home / Self Care | Payer: Medicare Other | Source: Ambulatory Visit | Attending: Internal Medicine | Admitting: Internal Medicine

## 2015-05-04 DIAGNOSIS — R928 Other abnormal and inconclusive findings on diagnostic imaging of breast: Secondary | ICD-10-CM

## 2015-06-17 ENCOUNTER — Telehealth: Payer: Self-pay | Admitting: *Deleted

## 2015-06-17 NOTE — Telephone Encounter (Signed)
Oncology Nurse Navigator Documentation  Oncology Nurse Navigator Flowsheets 06/17/2015  Navigator Encounter Type Telephone/Called patient today to check on her smoking cessation.  I left a vm message with my name and phone number to call if needed assistance  Barriers/Navigation Needs Education

## 2015-06-17 NOTE — Telephone Encounter (Signed)
Oncology Nurse Navigator Documentation  Oncology Nurse Navigator Flowsheets 06/17/2015  Navigator Encounter Type Telephone/Patient called me back and then I called her.  I spoke to patient about her smoking cessation.  We discussed tips to help.  She will try to put cig in a plastic bag and then take one out a week to quit.  I let her know about nicotine withdrawal and how she may feel.  I asked that she call me if needed to help quit.   Barriers/Navigation Needs Education

## 2015-07-05 DIAGNOSIS — H6123 Impacted cerumen, bilateral: Secondary | ICD-10-CM | POA: Diagnosis not present

## 2015-07-05 DIAGNOSIS — H903 Sensorineural hearing loss, bilateral: Secondary | ICD-10-CM | POA: Diagnosis not present

## 2015-07-05 DIAGNOSIS — H61393 Other acquired stenosis of external ear canal, bilateral: Secondary | ICD-10-CM | POA: Diagnosis not present

## 2015-07-07 ENCOUNTER — Ambulatory Visit: Payer: Medicare Other | Admitting: Internal Medicine

## 2015-07-08 ENCOUNTER — Ambulatory Visit (INDEPENDENT_AMBULATORY_CARE_PROVIDER_SITE_OTHER): Payer: Medicare Other | Admitting: Internal Medicine

## 2015-07-08 ENCOUNTER — Encounter: Payer: Self-pay | Admitting: Internal Medicine

## 2015-07-08 ENCOUNTER — Other Ambulatory Visit: Payer: Self-pay | Admitting: Internal Medicine

## 2015-07-08 VITALS — BP 110/70 | HR 64 | Temp 98.4°F | Wt 114.0 lb

## 2015-07-08 DIAGNOSIS — E785 Hyperlipidemia, unspecified: Secondary | ICD-10-CM

## 2015-07-08 DIAGNOSIS — I10 Essential (primary) hypertension: Secondary | ICD-10-CM | POA: Diagnosis not present

## 2015-07-08 DIAGNOSIS — I7300 Raynaud's syndrome without gangrene: Secondary | ICD-10-CM

## 2015-07-08 DIAGNOSIS — Z86010 Personal history of colonic polyps: Secondary | ICD-10-CM

## 2015-07-08 DIAGNOSIS — R531 Weakness: Secondary | ICD-10-CM

## 2015-07-08 DIAGNOSIS — Z720 Tobacco use: Secondary | ICD-10-CM

## 2015-07-08 DIAGNOSIS — I2510 Atherosclerotic heart disease of native coronary artery without angina pectoris: Secondary | ICD-10-CM | POA: Diagnosis not present

## 2015-07-08 DIAGNOSIS — E039 Hypothyroidism, unspecified: Secondary | ICD-10-CM

## 2015-07-08 LAB — COMPREHENSIVE METABOLIC PANEL
ALK PHOS: 72 U/L (ref 39–117)
ALT: 55 U/L — ABNORMAL HIGH (ref 0–35)
AST: 74 U/L — AB (ref 0–37)
Albumin: 4.5 g/dL (ref 3.5–5.2)
BILIRUBIN TOTAL: 0.4 mg/dL (ref 0.2–1.2)
BUN: 11 mg/dL (ref 6–23)
CHLORIDE: 97 meq/L (ref 96–112)
CO2: 33 mEq/L — ABNORMAL HIGH (ref 19–32)
CREATININE: 1.17 mg/dL (ref 0.40–1.20)
Calcium: 9.8 mg/dL (ref 8.4–10.5)
GFR: 48.19 mL/min — ABNORMAL LOW (ref >60.00)
GLUCOSE: 79 mg/dL (ref 70–99)
POTASSIUM: 4.8 meq/L (ref 3.5–5.1)
Sodium: 136 mEq/L (ref 135–145)
Total Protein: 7.2 g/dL (ref 6.0–8.3)

## 2015-07-08 LAB — TSH: TSH: 110.91 u[IU]/mL — AB (ref 0.35–4.50)

## 2015-07-08 LAB — CBC WITH DIFFERENTIAL/PLATELET
Basophils Absolute: 0 10*3/uL (ref 0.0–0.1)
Basophils Relative: 0.3 % (ref 0.0–3.0)
EOS ABS: 0.1 10*3/uL (ref 0.0–0.7)
Eosinophils Relative: 1.2 % (ref 0.0–5.0)
HCT: 44.1 % (ref 36.0–46.0)
Hemoglobin: 14.7 g/dL (ref 12.0–15.0)
LYMPHS PCT: 43.3 % (ref 12.0–46.0)
Lymphs Abs: 2.9 10*3/uL (ref 0.7–4.0)
MCHC: 33.3 g/dL (ref 30.0–36.0)
MCV: 96.1 fl (ref 78.0–100.0)
MONO ABS: 0.4 10*3/uL (ref 0.1–1.0)
MONOS PCT: 6.4 % (ref 3.0–12.0)
NEUTROS PCT: 48.8 % (ref 43.0–77.0)
Neutro Abs: 3.3 10*3/uL (ref 1.4–7.7)
PLATELETS: 230 10*3/uL (ref 150.0–400.0)
RBC: 4.59 Mil/uL (ref 3.87–5.11)
RDW: 15.9 % — ABNORMAL HIGH (ref 11.5–15.5)
WBC: 6.7 10*3/uL (ref 4.0–10.5)

## 2015-07-08 LAB — LIPID PANEL
Cholesterol: 271 mg/dL — ABNORMAL HIGH (ref 0–200)
HDL: 83.4 mg/dL (ref >39.00)
LDL Cholesterol: 160 mg/dL — ABNORMAL HIGH (ref 0–99)
NONHDL: 187.49
TRIGLYCERIDES: 137 mg/dL (ref 0.0–149.0)
Total CHOL/HDL Ratio: 3
VLDL: 27.4 mg/dL (ref 0.0–40.0)

## 2015-07-08 LAB — T4, FREE: Free T4: 0 ng/dL — ABNORMAL LOW (ref 0.60–1.60)

## 2015-07-08 LAB — SEDIMENTATION RATE: Sed Rate: 3 mm/hr (ref 0–22)

## 2015-07-08 MED ORDER — LEVOTHYROXINE SODIUM 50 MCG PO TABS: ORAL_TABLET | ORAL | Status: DC

## 2015-07-08 NOTE — Patient Instructions (Signed)
Limit your sodium (Salt) intake  Please check your blood pressure on a regular basis.  If it is consistently greater than 150/90, please make an office appointment.  Return in one month for follow-up  

## 2015-07-08 NOTE — Progress Notes (Signed)
Subjective:    Patient ID: [name], female    DOB: Jul 11, 1941, 74 y.o.   MRN: [id number]  HPI  Wt Readings from Last 3 Encounters:  07/08/15 114 lb (51.71 kg)  04/14/15 110 lb (49.896 kg)  09/20/14 114 lb 12.8 oz (52.54 kg)   74 year old patient who has a history of essential hypertension, coronary and carotid artery disease as well as renal phenomenon. She presents with a several month history of fatigue and general sense of unwellness.  She complains of poor appetite, weight loss and poor energy level. She states that she has taken no medications over the past 3 months, including 3 blood pressure medications. She apparently does not take statin therapy for unclear reasons. She is scheduled for follow-up colonoscopy in November. The patient had a low intensity CT of the chest in May of this year without evidence of primary lung cancer.  Patient also complains of some intermittent hoarseness and easy bruisability  Past Medical History  Diagnosis Date  . Coronary atherosclerosis of unspecified type of vessel, native or graft   . Other and unspecified hyperlipidemia   . Backache, unspecified   . Raynaud's syndrome   . Personal history of other diseases of digestive system   . Diverticulosis of colon (without mention of hemorrhage)   . Disorder of bone and cartilage, unspecified   . Diverticulosis of colon (without mention of hemorrhage)   . Hemorrhage of gastrointestinal tract, unspecified   . Unspecified hypothyroidism   . 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 Urinary tract infection, site not specified   . Urinary frequency   . Personal history of colonic polyps     sees Dr. Ardis Hughs, gastro  . Hypothyroidism   . Anxiety state, unspecified     no per pt  . Arthritis   . Blood transfusion without reported diagnosis   . Ulcer   . Myocardial infarction     2009sees Dr. Burt Knack, saw last 02/2012  . Bronchitis     hx of  . Cough    "patient is a smoker"  . Pneumonia   . Anemia     hx of  . Diverticulitis     hx of  . Chronic back pain   . Unspecified essential hypertension     sees Dr. Bluford Kaufmann [phone] at [facility]    History   Social History  . Marital Status: Married    Spouse Name: N/A  . Number of Children: N/A  . 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 Smoking status: Current Every Day Smoker -- 0.75 packs/day for 40 years    Types: Cigarettes  . Smokeless tobacco: Never Used  . Alcohol Use: No  . Drug Use: No  . Sexual Activity: Not on file   Other Topics Concern  . Not on file   Social History Narrative    Past Surgical History  Procedure Laterality Date  . Gastrointestinal stomal tumor resection    . Cholecystectomy    . Tubal ligation    . Abdominal hysterectomy    . Appendectomy    . Coronary angioplasty with stent placement      x1, 2007or 2008  . Abdominal hysterectomy    . Partial colectomy  12/23/2012  . Laparoscopic partial colectomy  12/23/2012    Procedure: LAPAROSCOPIC PARTIAL COLECTOMY;  Surgeon: Stark Klein, MD;  Location: [place];  Service: General;  Laterality: N/A;    Family History  Problem Relation Age of Onset  . Heart disease Mother     Verdie Drown  . Lung cancer Father     SISTER, UNCLE  . Cerebral aneurysm Sister     UNCLE NEPHEW  . Colon cancer Neg Hx   . Esophageal cancer Neg Hx   . Stomach cancer Neg Hx   . Rectal cancer Neg Hx     No Known Allergies  Current Outpatient Prescriptions on File Prior to Visit  Medication Sig Dispense Refill  . aspirin 81 MG tablet Take 81 mg by mouth daily.      . cyclobenzaprine (FLEXERIL) 5 MG tablet Take 1 tablet (5 mg total) by mouth 3 (three) times daily as needed for muscle spasms. 30 tablet 1  . HYDROcodone-acetaminophen (NORCO/VICODIN) 5-325 MG per tablet Take 1 tablet by mouth every 6 (six) hours as needed for moderate pain. 30  tablet 0  . lisinopril (PRINIVIL,ZESTRIL) 20 MG tablet TAKE ONE TABLET BY MOUTH ONE TIME DAILY 90 tablet 3  . metoprolol succinate (TOPROL-XL) 50 MG 24 hr tablet TAKE ONE TABLET BY MOUTH DAILY WITH OR IMMEDIATELY FOLLOWING A MEAL. 90 tablet 1  . nitroGLYCERIN (NITROSTAT) 0.4 MG SL tablet Place 1 tablet (0.4 mg total) under the tongue every 5 (five) minutes as needed for chest pain. 25 tablet 3  . SYNTHROID 125 MCG tablet TAKE 1 TABLET BY MOUTH ONCE DAILY 90 tablet 1  . Vitamin D, Ergocalciferol, (DRISDOL) 50000 UNITS CAPS capsule Take 1 capsule (50,000 Units total) by mouth every 7 (seven) days. 12 capsule 1  . amLODipine (NORVASC) 10 MG tablet Take 1 tablet (10 mg total) by mouth daily. (Patient not taking: Reported on 07/08/2015) 30 tablet 11  . cholestyramine light (PREVALITE) 4 G packet Take 1 packet (4 g total) by mouth 2 (two) times daily. (Patient not taking: Reported on 07/08/2015) 60 packet 11   No current facility-administered medications on file prior to visit.    BP 110/70 mmHg  Pulse 64  Temp(Src) 98.4 F (36.9 C) (Oral)  Wt 114 lb (51.71 kg)      Review of Systems  Constitutional: Positive for activity change, appetite change and fatigue.  HENT: Negative for congestion, dental problem, hearing loss, rhinorrhea, sinus pressure, sore throat and tinnitus.   Eyes: Negative for pain, discharge and visual disturbance.  Respiratory: Negative for cough and shortness of breath.   Cardiovascular: Negative for chest pain, palpitations and leg swelling.  Gastrointestinal: Negative for nausea, vomiting, abdominal pain, diarrhea, constipation, blood in stool and abdominal distention.  Genitourinary: Negative for dysuria, urgency, frequency, hematuria, flank pain, vaginal bleeding, vaginal discharge, difficulty urinating, vaginal pain and pelvic pain.  Musculoskeletal: Negative for joint swelling, arthralgias and gait problem.  Skin: Negative for rash.  Neurological: Positive for weakness.  Negative for dizziness, syncope, speech difficulty, numbness and headaches.  Hematological: Negative for adenopathy.  Psychiatric/Behavioral: Negative for behavioral problems, dysphoric mood and agitation. The patient is not nervous/anxious.        Objective:   Physical Exam  Constitutional: She is oriented to person, place, and time. She appears well-developed and well-nourished.  Blood pressure on arrival 110 over 70 Repeat blood pressure 130/90  HENT:  Head: Normocephalic.  Right Ear: External ear normal.  Left Ear: External ear normal.  Mouth/Throat: Oropharynx is clear and moist.  Eyes: Conjunctivae and EOM are normal. Pupils are equal, round, and reactive to light.  Infraorbital puffiness  Neck: Normal  range of motion. Neck supple. No thyromegaly present.  Cardiovascular: Normal rate, regular rhythm, normal heart sounds and intact distal pulses.   Pulmonary/Chest: Effort normal and breath sounds normal.  Abdominal: Soft. Bowel sounds are normal. She exhibits no mass. There is no tenderness.  Musculoskeletal: Normal range of motion.  Lymphadenopathy:    She has no cervical adenopathy.  Neurological: She is alert and oriented to person, place, and time.  Skin: Skin is warm and dry. No rash noted.  Scattered bruising  Psychiatric: She has a normal mood and affect. Her behavior is normal.          Assessment & Plan:   Fatigue History weight loss Hypertension, history of Raynaud's disease Coronary artery disease History of carotid artery disease Hypothyroidism  Weight loss is not documented.  We'll check screening labs today including sedimentation rate.  The symptoms seem chronic.  Will recheck in 4 weeks.  Consider antidepressant medication at that time.  Will check a TSH.  Likely will need to start levothyroxin.  Will resume metoprolol but otherwise hold amlodipine and lisinopril at this time  We'll discuss statin therapy next month

## 2015-07-08 NOTE — Progress Notes (Signed)
Pre visit review using our clinic review tool, if applicable. No additional management support is needed unless otherwise documented below in the visit note. 

## 2015-07-11 ENCOUNTER — Other Ambulatory Visit: Payer: Self-pay | Admitting: Internal Medicine

## 2015-08-09 ENCOUNTER — Encounter: Payer: Self-pay | Admitting: Internal Medicine

## 2015-08-09 ENCOUNTER — Ambulatory Visit (INDEPENDENT_AMBULATORY_CARE_PROVIDER_SITE_OTHER): Payer: Medicare Other | Admitting: Internal Medicine

## 2015-08-09 DIAGNOSIS — E559 Vitamin D deficiency, unspecified: Secondary | ICD-10-CM

## 2015-08-09 DIAGNOSIS — I2510 Atherosclerotic heart disease of native coronary artery without angina pectoris: Secondary | ICD-10-CM

## 2015-08-09 DIAGNOSIS — E538 Deficiency of other specified B group vitamins: Secondary | ICD-10-CM | POA: Diagnosis not present

## 2015-08-09 DIAGNOSIS — I10 Essential (primary) hypertension: Secondary | ICD-10-CM

## 2015-08-09 DIAGNOSIS — E039 Hypothyroidism, unspecified: Secondary | ICD-10-CM | POA: Diagnosis not present

## 2015-08-09 LAB — TSH: TSH: 5.03 u[IU]/mL — ABNORMAL HIGH (ref 0.35–4.50)

## 2015-08-09 LAB — VITAMIN B12: VITAMIN B 12: 217 pg/mL (ref 211–911)

## 2015-08-09 LAB — VITAMIN D 25 HYDROXY (VIT D DEFICIENCY, FRACTURES): VITD: 21.34 ng/mL — ABNORMAL LOW (ref 30.00–100.00)

## 2015-08-09 LAB — T4, FREE: FREE T4: 1.12 ng/dL (ref 0.60–1.60)

## 2015-08-09 MED ORDER — CYANOCOBALAMIN 1000 MCG/ML IJ SOLN
1000.0000 ug | Freq: Once | INTRAMUSCULAR | Status: AC
  Administered 2015-08-09: 1000 ug via INTRAMUSCULAR

## 2015-08-09 NOTE — Progress Notes (Signed)
Subjective:    Patient ID: [name], female    DOB: [birth year], 74 y.o.   MRN: [id number]  HPI  Wt Readings from Last 3 Encounters:  08/09/15 112 lb (50.803 kg)  07/08/15 114 lb (51.71 kg)  04/14/15 110 lb (49.4 kg)    74 year old patient who is seen today for follow-up of hypothyroidism.  She was seen 1 month ago after self discontinuation of thyroid supplements.  Free T3 was 0 with a TSH of greater than 100.  Thyroid supplementation was resumed and uptitrated.  She presently is on a dose of 100 g daily.  She still feels weak Laboratory screen revealed a normal CBC, although review of her chart reveals a low serum B12 level in the past.  She failed to return for monthly B12 injections She also has a history of vitamin D deficiency  Past Medical History  Diagnosis Date  . Coronary atherosclerosis of unspecified type of vessel, native or graft   . Other and unspecified hyperlipidemia   . Backache, unspecified   . Raynaud's syndrome   . Personal history of other diseases of digestive system   . Diverticulosis of colon (without mention of hemorrhage)   . Disorder of bone and cartilage, unspecified   . Diverticulosis of colon (without mention of hemorrhage)   . Hemorrhage of gastrointestinal tract, unspecified   . Unspecified hypothyroidism   . 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 Urinary tract infection, site not specified   . Urinary frequency   . Personal history of colonic polyps     sees Dr. Ardis Hughs, gastro  . Hypothyroidism   . Anxiety state, unspecified     no per pt  . Arthritis   . Blood transfusion without reported diagnosis   . Ulcer   . Myocardial infarction     2009sees Dr. Burt Knack, saw last 02/2012  . Bronchitis     hx of  . Cough     "patient is a smoker"  . Pneumonia   . Anemia     hx of  . Diverticulitis     hx of  . Chronic back pain   . Unspecified essential hypertension     sees Dr. Bluford Kaufmann  [phone] at [facility]  . Marital Status: Married    Spouse Name: N/A  . Number of Children: N/A  . 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 Smoking status: Current Every Day Smoker -- 0.75 packs/day for 40 years    Types: Cigarettes  . Smokeless tobacco: Never Used  . Alcohol Use: No  . Drug Use: No  . Sexual Activity: Not on file   Other Topics Concern  . Not on file   Social History Narrative    Past Surgical History  Procedure Laterality Date  . Gastrointestinal stomal tumor resection    . Cholecystectomy    . Tubal ligation    . Abdominal hysterectomy    . Appendectomy    . Coronary angioplasty with stent placement      x1, 2007or 2008  . Abdominal hysterectomy    . Partial colectomy  12/23/2012  . Laparoscopic partial colectomy  12/23/2012    Procedure: LAPAROSCOPIC PARTIAL COLECTOMY;  Surgeon: Stark Klein, MD;  Location: [facility];  Service: General;  Laterality: N/A;    Family History  Problem Relation Age of  Onset  . Heart disease Mother     Verdie Drown  . Lung cancer Father     SISTER, UNCLE  . Cerebral aneurysm Sister     UNCLE NEPHEW  . Colon cancer Neg Hx   . Esophageal cancer Neg Hx   . Stomach cancer Neg Hx   . Rectal cancer Neg Hx     No Known Allergies  Current Outpatient Prescriptions on File Prior to Visit  Medication Sig Dispense Refill  . amLODipine (NORVASC) 10 MG tablet Take 1 tablet (10 mg total) by mouth daily. 30 tablet 11  . aspirin 81 MG tablet Take 81 mg by mouth daily.      . cholestyramine light (PREVALITE) 4 G packet Take 1 packet (4 g total) by mouth 2 (two) times daily. 60 packet 11  . cyclobenzaprine (FLEXERIL) 5 MG tablet Take 1 tablet (5 mg total) by mouth 3 (three) times daily as needed for muscle spasms. 30 tablet 1  . HYDROcodone-acetaminophen (NORCO/VICODIN) 5-325 MG per tablet Take 1 tablet by mouth every 6 (six) hours  as needed for moderate pain. 30 tablet 0  . levothyroxine (SYNTHROID, LEVOTHROID) 50 MCG tablet One tablet daily for one week, then 2 tablets daily 90 tablet 3  . lisinopril (PRINIVIL,ZESTRIL) 20 MG tablet TAKE ONE TABLET BY MOUTH ONE TIME DAILY 90 tablet 3  . metoprolol succinate (TOPROL-XL) 50 MG 24 hr tablet TAKE ONE TABLET BY MOUTH DAILY WITH OR IMMEDIATELY FOLLOWING A MEAL. 90 tablet 1  . nitroGLYCERIN (NITROSTAT) 0.4 MG SL tablet Place 1 tablet (0.4 mg total) under the tongue every 5 (five) minutes as needed for chest pain. 25 tablet 3  . SYNTHROID 125 MCG tablet TAKE 1 TABLET BY MOUTH ONCE DAILY 90 tablet 1  . Vitamin D, Ergocalciferol, (DRISDOL) 50000 UNITS CAPS capsule Take 1 capsule (50,000 Units total) by mouth every 7 (seven) days. 12 capsule 1   No current facility-administered medications on file prior to visit.    BP 130/80 mmHg  Pulse 64  Temp(Src) 98.1 F (36.7 C) (Oral)  Ht 5' 5.5" (1.664 m)  Wt 112 lb (50.803 kg)  BMI 18.35 kg/m2  SpO2 93%     Review of Systems  Constitutional: Positive for appetite change and unexpected weight change.  HENT: Negative for congestion, dental problem, hearing loss, rhinorrhea, sinus pressure, sore throat and tinnitus.   Eyes: Negative for pain, discharge and visual disturbance.  Respiratory: Negative for cough and shortness of breath.   Cardiovascular: Negative for chest pain, palpitations and leg swelling.  Gastrointestinal: Negative for nausea, vomiting, abdominal pain, diarrhea, constipation, blood in stool and abdominal distention.  Genitourinary: Negative for dysuria, urgency, frequency, hematuria, flank pain, vaginal bleeding, vaginal discharge, difficulty urinating, vaginal pain and pelvic pain.  Musculoskeletal: Negative for joint swelling, arthralgias and gait problem.  Skin: Negative for rash.  Neurological: Negative for dizziness, syncope, speech difficulty, weakness, numbness and headaches.  Hematological: Negative for  adenopathy.  Psychiatric/Behavioral: Negative for behavioral problems, dysphoric mood and agitation. The patient is not nervous/anxious.        Objective:   Physical Exam  Constitutional: She is oriented to person, place, and time. She appears well-developed and well-nourished.  HENT:  Head: Normocephalic.  Right Ear: External ear normal.  Left Ear: External ear normal.  Mouth/Throat: Oropharynx is clear and moist.  Eyes: Conjunctivae and EOM are normal. Pupils are equal, round, and reactive to light.  .  Orbital puffiness and thinning of the lateral eyelids  Neck: Normal  range of motion. Neck supple. No thyromegaly present.  Cardiovascular: Normal rate, regular rhythm, normal heart sounds and intact distal pulses.   Pulmonary/Chest: Effort normal and breath sounds normal.  Abdominal: Soft. Bowel sounds are normal. She exhibits no mass. There is no tenderness.  Musculoskeletal: Normal range of motion.  Lymphadenopathy:    She has no cervical adenopathy.  Neurological: She is alert and oriented to person, place, and time.  Reflexes fairly brisk but with a slow relaxation phase  Skin: Skin is warm and dry. No rash noted.  Psychiatric: She has a normal mood and affect. Her behavior is normal.          Assessment & Plan:   Severe hypothyroidism.  We'll check a TSH and free T4.  Patient has been on 100 g daily Vitamin B-12 level.  We'll recheck a B12 level and give 1 mg IM today.  We'll continue weekly for 4 consecutive weeks History of vitamin D deficiency.  We'll also check a vitamin D level Essential hypertension, stable  Recheck 6 weeks

## 2015-08-09 NOTE — Progress Notes (Signed)
Pre visit review using our clinic review tool, if applicable. No additional management support is needed unless otherwise documented below in the visit note. 

## 2015-08-09 NOTE — Patient Instructions (Signed)
Vitamin B12 shots weekly for 4 consecutive weeks  Return in 6 weeks for follow-up  Take a calcium supplement, plus 2000  units of vitamin D

## 2015-08-10 ENCOUNTER — Encounter: Payer: Self-pay | Admitting: Gastroenterology

## 2015-08-12 ENCOUNTER — Telehealth: Payer: Self-pay | Admitting: Internal Medicine

## 2015-08-12 NOTE — Telephone Encounter (Signed)
Pt states on her AVS it states levothyroxine (SYNTHROID, LEVOTHROID) 125 MCG tablet  But pt states she is taking 100 (2/ 50)  New instructions are different for refill from 07/08/15 Pt is confused. pls advise

## 2015-08-12 NOTE — Telephone Encounter (Signed)
Left detailed message on voicemail that pt should be taking levothyroxine 50 mcg two tablets daily and do not take 125 mcg. Any questions please call the office.

## 2015-08-15 MED ORDER — LEVOTHYROXINE SODIUM 50 MCG PO TABS: 100.0000 ug | ORAL_TABLET | Freq: Every day | ORAL | Status: DC

## 2015-08-15 NOTE — Telephone Encounter (Signed)
Spoke to pt, asked her if she got my message? Pt said yes, but she is still confused because it says on bottle take 50 mcg x 1 week, then 2 tablets when she got refill. Told pt it is suppose to be 2 tablets daily now, the directions on the bottle were not changed when you got refill. I will send new Rx in saying take 2 tablets daily and don't pickup till you need it. Pt verbalized understanding. Rx sent.

## 2015-08-16 ENCOUNTER — Ambulatory Visit (INDEPENDENT_AMBULATORY_CARE_PROVIDER_SITE_OTHER): Payer: Medicare Other | Admitting: *Deleted

## 2015-08-16 DIAGNOSIS — E538 Deficiency of other specified B group vitamins: Secondary | ICD-10-CM | POA: Diagnosis not present

## 2015-08-16 MED ORDER — CYANOCOBALAMIN 1000 MCG/ML IJ SOLN
1000.0000 ug | Freq: Once | INTRAMUSCULAR | Status: AC
  Administered 2015-08-16: 1000 ug via INTRAMUSCULAR

## 2015-08-23 ENCOUNTER — Ambulatory Visit (INDEPENDENT_AMBULATORY_CARE_PROVIDER_SITE_OTHER): Payer: Medicare Other | Admitting: *Deleted

## 2015-08-23 DIAGNOSIS — E538 Deficiency of other specified B group vitamins: Secondary | ICD-10-CM | POA: Diagnosis not present

## 2015-08-23 MED ORDER — CYANOCOBALAMIN 1000 MCG/ML IJ SOLN
1000.0000 ug | Freq: Once | INTRAMUSCULAR | Status: AC
  Administered 2015-08-23: 1000 ug via INTRAMUSCULAR

## 2015-08-30 ENCOUNTER — Ambulatory Visit (INDEPENDENT_AMBULATORY_CARE_PROVIDER_SITE_OTHER): Payer: Medicare Other | Admitting: *Deleted

## 2015-08-30 DIAGNOSIS — E538 Deficiency of other specified B group vitamins: Secondary | ICD-10-CM | POA: Diagnosis not present

## 2015-08-30 MED ORDER — CYANOCOBALAMIN 1000 MCG/ML IJ SOLN
1000.0000 ug | Freq: Once | INTRAMUSCULAR | Status: AC
  Administered 2015-08-30: 1000 ug via INTRAMUSCULAR

## 2015-09-02 ENCOUNTER — Telehealth: Payer: Self-pay | Admitting: *Deleted

## 2015-09-02 NOTE — Telephone Encounter (Signed)
Oncology Nurse Navigator Documentation  Oncology Nurse Navigator Flowsheets 09/02/2015  Navigator Encounter Type Telephone/I called patient today regarding her smoking cessation.  I listened as she explained she is still smoking.  I asked what she was doing to quit.  She is just trying to quit.  She states she is smoking 1/2 pack.  I gave her tips to quit.  She stated she would work on it.  I asked that she call me if needing more assistance.   Treatment Phase Other  Barriers/Navigation Needs Education  Education Smoking cessation  Interventions Education Method  Education Method Verbal  Time Spent with Patient -

## 2015-09-06 ENCOUNTER — Ambulatory Visit (INDEPENDENT_AMBULATORY_CARE_PROVIDER_SITE_OTHER): Payer: Medicare Other | Admitting: *Deleted

## 2015-09-06 DIAGNOSIS — E538 Deficiency of other specified B group vitamins: Secondary | ICD-10-CM

## 2015-09-06 MED ORDER — CYANOCOBALAMIN 1000 MCG/ML IJ SOLN
1000.0000 ug | Freq: Once | INTRAMUSCULAR | Status: AC
  Administered 2015-09-06: 1000 ug via INTRAMUSCULAR

## 2015-09-20 ENCOUNTER — Ambulatory Visit (INDEPENDENT_AMBULATORY_CARE_PROVIDER_SITE_OTHER): Payer: Medicare Other | Admitting: Internal Medicine

## 2015-09-20 ENCOUNTER — Encounter: Payer: Self-pay | Admitting: Internal Medicine

## 2015-09-20 VITALS — BP 122/80 | HR 60 | Temp 98.3°F | Resp 18 | Ht 65.5 in | Wt 110.0 lb

## 2015-09-20 DIAGNOSIS — E034 Atrophy of thyroid (acquired): Secondary | ICD-10-CM | POA: Diagnosis not present

## 2015-09-20 DIAGNOSIS — E785 Hyperlipidemia, unspecified: Secondary | ICD-10-CM | POA: Diagnosis not present

## 2015-09-20 DIAGNOSIS — E538 Deficiency of other specified B group vitamins: Secondary | ICD-10-CM

## 2015-09-20 DIAGNOSIS — I10 Essential (primary) hypertension: Secondary | ICD-10-CM | POA: Diagnosis not present

## 2015-09-20 DIAGNOSIS — I2510 Atherosclerotic heart disease of native coronary artery without angina pectoris: Secondary | ICD-10-CM

## 2015-09-20 DIAGNOSIS — E038 Other specified hypothyroidism: Secondary | ICD-10-CM | POA: Diagnosis not present

## 2015-09-20 LAB — T4, FREE: Free T4: 1 ng/dL (ref 0.60–1.60)

## 2015-09-20 LAB — TSH: TSH: 1.81 u[IU]/mL (ref 0.35–4.50)

## 2015-09-20 MED ORDER — CYANOCOBALAMIN 1000 MCG/ML IJ SOLN
1000.0000 ug | Freq: Once | INTRAMUSCULAR | Status: AC
  Administered 2015-09-20: 1000 ug via INTRAMUSCULAR

## 2015-09-20 NOTE — Progress Notes (Signed)
Subjective:    Patient ID: [name], female    DOB: 1941-10-23, 74 y.o.   MRN: [id number]  HPI  74 year old patient who is seen today for follow-up of hypothyroidism.  She feels well and has been compliant with her levothyroxin supplementation.  She has been taking 100 g daily.  She generally feels well. She has a history of vitamin D deficiency but has not been taking vitamin D supplements. She also has vitamin B12 deficiency but has not been compliant with oral supplementation  Past Medical History  Diagnosis Date  . Coronary atherosclerosis of unspecified type of vessel, native or graft   . Other and unspecified hyperlipidemia   . Backache, unspecified   . Raynaud's syndrome   . Personal history of other diseases of digestive system   . Diverticulosis of colon (without mention of hemorrhage)   . Disorder of bone and cartilage, unspecified   . Diverticulosis of colon (without mention of hemorrhage)   . Hemorrhage of gastrointestinal tract, unspecified   . Unspecified hypothyroidism   . 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 Urinary tract infection, site not specified   . Urinary frequency   . Personal history of colonic polyps     sees Dr. Ardis Hughs, gastro  . Hypothyroidism   . Anxiety state, unspecified     no per pt  . Arthritis   . Blood transfusion without reported diagnosis   . Ulcer   . Myocardial infarction [facility])     2009sees Dr. Burt Knack, saw last 02/2012  . Bronchitis     hx of  . Cough     "patient is a smoker"  . Pneumonia   . Anemia     hx of  . Diverticulitis     hx of  . Chronic back pain   . Unspecified essential hypertension     sees Dr. Bluford Kaufmann [phone] at [place] History  . Marital Status: Married    Spouse Name: N/A  . Number of Children: N/A  . 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 Smoking status:  Current Every Day Smoker -- 0.75 packs/day for 40 years    Types: Cigarettes  . Smokeless tobacco: Never Used  . Alcohol Use: No  . Drug Use: No  . Sexual Activity: Not on file   Other Topics Concern  . Not on file   Social History Narrative    Past Surgical History  Procedure Laterality Date  . Gastrointestinal stomal tumor resection    . Cholecystectomy    . Tubal ligation    . Abdominal hysterectomy    . Appendectomy    . Coronary angioplasty with stent placement      x1, 2007or 2008  . Abdominal hysterectomy    . Partial colectomy  12/23/2012  . Laparoscopic partial colectomy  12/23/2012    Procedure: LAPAROSCOPIC PARTIAL COLECTOMY;  Surgeon: Stark Klein, MD;  Location: [facility] OR;  Service: General;  Laterality: N/A;    Family History  Problem Relation Age of Onset  . Heart disease Mother     Verdie Drown  . Lung cancer Father     SISTER, UNCLE  . Cerebral aneurysm Sister     UNCLE NEPHEW  . Colon cancer Neg Hx   . Esophageal cancer Neg Hx   . Stomach cancer Neg Hx   . Rectal  cancer Neg Hx     No Known Allergies  Current Outpatient Prescriptions on File Prior to Visit  Medication Sig Dispense Refill  . amLODipine (NORVASC) 10 MG tablet Take 1 tablet (10 mg total) by mouth daily. 30 tablet 11  . aspirin 81 MG tablet Take 81 mg by mouth daily.      . cholestyramine light (PREVALITE) 4 G packet Take 1 packet (4 g total) by mouth 2 (two) times daily. 60 packet 11  . cyclobenzaprine (FLEXERIL) 5 MG tablet Take 1 tablet (5 mg total) by mouth 3 (three) times daily as needed for muscle spasms. 30 tablet 1  . HYDROcodone-acetaminophen (NORCO/VICODIN) 5-325 MG per tablet Take 1 tablet by mouth every 6 (six) hours as needed for moderate pain. 30 tablet 0  . levothyroxine (SYNTHROID, LEVOTHROID) 50 MCG tablet Take 2 tablets (100 mcg total) by mouth daily before breakfast. 60 tablet 5  . lisinopril (PRINIVIL,ZESTRIL) 20 MG tablet TAKE ONE TABLET BY MOUTH ONE TIME DAILY 90  tablet 3  . metoprolol succinate (TOPROL-XL) 50 MG 24 hr tablet TAKE ONE TABLET BY MOUTH DAILY WITH OR IMMEDIATELY FOLLOWING A MEAL. 90 tablet 1  . nitroGLYCERIN (NITROSTAT) 0.4 MG SL tablet Place 1 tablet (0.4 mg total) under the tongue every 5 (five) minutes as needed for chest pain. 25 tablet 3  . Vitamin D, Ergocalciferol, (DRISDOL) 50000 UNITS CAPS capsule Take 1 capsule (50,000 Units total) by mouth every 7 (seven) days. 12 capsule 1   No current facility-administered medications on file prior to visit.    BP 122/80 mmHg  Pulse 60  Temp(Src) 98.3 F (36.8 C) (Oral)  Resp 18  Ht 5' 5.5" (1.664 m)  Wt 110 lb (49.896 kg)  BMI 18.02 kg/m2     Review of Systems  Constitutional: Negative.   HENT: Negative for congestion, dental problem, hearing loss, rhinorrhea, sinus pressure, sore throat and tinnitus.   Eyes: Negative for pain, discharge and visual disturbance.  Respiratory: Negative for cough and shortness of breath.   Cardiovascular: Negative for chest pain, palpitations and leg swelling.  Gastrointestinal: Negative for nausea, vomiting, abdominal pain, diarrhea, constipation, blood in stool and abdominal distention.  Genitourinary: Negative for dysuria, urgency, frequency, hematuria, flank pain, vaginal bleeding, vaginal discharge, difficulty urinating, vaginal pain and pelvic pain.  Musculoskeletal: Negative for joint swelling, arthralgias and gait problem.  Skin: Negative for rash.  Neurological: Negative for dizziness, syncope, speech difficulty, weakness, numbness and headaches.  Hematological: Negative for adenopathy.  Psychiatric/Behavioral: Negative for behavioral problems, dysphoric mood and agitation. The patient is not nervous/anxious.        Objective:   Physical Exam  Constitutional: She is oriented to person, place, and time. She appears well-developed and well-nourished.  HENT:  Head: Normocephalic.  Right Ear: External ear normal.  Left Ear: External ear  normal.  Mouth/Throat: Oropharynx is clear and moist.  Eyes: Conjunctivae and EOM are normal. Pupils are equal, round, and reactive to light.  Infraorbital puffiness  Neck: Normal range of motion. Neck supple. No thyromegaly present.  Cardiovascular: Normal rate, regular rhythm, normal heart sounds and intact distal pulses.   Pulmonary/Chest: Effort normal and breath sounds normal.  Abdominal: Soft. Bowel sounds are normal. She exhibits no mass. There is no tenderness.  Musculoskeletal: Normal range of motion.  Lymphadenopathy:    She has no cervical adenopathy.  Neurological: She is alert and oriented to person, place, and time.  Skin: Skin is warm and dry. No rash noted.  Psychiatric: She  has a normal mood and affect. Her behavior is normal.          Assessment & Plan:   Hypothyroidism.  Appears to be clinically euthyroid.  Will check a TSH and free T4.  Adjust supplementation if needed.  Hyper Hypertension, stable  Compliance stressed Recheck 6 months

## 2015-09-20 NOTE — Patient Instructions (Addendum)
Limit your sodium (Salt) intake  Please check your blood pressure on a regular basis.  If it is consistently greater than 150/90, please make an office appointment.  Return in 6 months for follow-up  Take a calcium supplement, plus [phone] units of vitamin D  Vitamin B12 1 mg by mouth daily

## 2015-09-20 NOTE — Addendum Note (Signed)
Addended by: Marian Sorrow on: 09/20/2015 02:11 PM   Modules accepted: Orders

## 2015-09-20 NOTE — Progress Notes (Signed)
Pre visit review using our clinic review tool, if applicable. No additional management support is needed unless otherwise documented below in the visit note. 

## 2015-09-21 ENCOUNTER — Other Ambulatory Visit: Payer: Self-pay | Admitting: *Deleted

## 2015-09-21 MED ORDER — LEVOTHYROXINE SODIUM 100 MCG PO TABS: 100.0000 ug | ORAL_TABLET | Freq: Every day | ORAL | Status: DC

## 2015-09-28 ENCOUNTER — Ambulatory Visit (INDEPENDENT_AMBULATORY_CARE_PROVIDER_SITE_OTHER): Payer: Medicare Other | Admitting: Cardiovascular Disease

## 2015-09-28 ENCOUNTER — Encounter: Payer: Self-pay | Admitting: Cardiovascular Disease

## 2015-09-28 VITALS — BP 118/82 | HR 68 | Ht 65.5 in | Wt 110.0 lb

## 2015-09-28 DIAGNOSIS — I2510 Atherosclerotic heart disease of native coronary artery without angina pectoris: Secondary | ICD-10-CM | POA: Diagnosis not present

## 2015-09-28 DIAGNOSIS — I10 Essential (primary) hypertension: Secondary | ICD-10-CM | POA: Diagnosis not present

## 2015-09-28 MED ORDER — AMLODIPINE BESYLATE 10 MG PO TABS: 10.0000 mg | ORAL_TABLET | Freq: Every day | ORAL | Status: DC

## 2015-09-28 NOTE — Patient Instructions (Signed)

## 2015-09-28 NOTE — Progress Notes (Signed)
Cardiology Office Note Date:  09/30/2015   ID:  ERNISHA SORN, DOB 1941-02-24, MRN [id number]  PCP:  Nyoka Cowden, MD  Cardiologist:  Sherren Mocha, MD    Chief Complaint  Patient presents with  . Fatigue    History of Present Illness: [name] is a 74 y.o. female who presents for followup evaluation. The patient has coronary artery disease. She initially presented with non-ST elevation infarction in 2008 and was treated with bare-metal stenting of the mid LAD. Her most recent heart catheterization in 2013 showed patency of the stent in her mid LAD with diffuse nonobstructive disease in the right coronary artery and left circumflex vessels. The patient's LV function was normal.  From a symptomatic perspective, the patient is doing well. She is not doing much traveling anymore and is generally less active than in the past. She has no cardiovascular symptoms. She has been diagnosed with severe hypothyroidism and has started taking Synthroid. Remarkably, she does not appear to have any symptoms related to this and she states that she feels fine. Today, she denies symptoms of palpitations, chest pain, shortness of breath, orthopnea, PND, lower extremity edema, dizziness, or syncope.   Past Medical History  Diagnosis Date  . Coronary atherosclerosis of unspecified type of vessel, native or graft   . Other and unspecified hyperlipidemia   . Backache, unspecified   . Raynaud's syndrome   . Personal history of other diseases of digestive system   . Diverticulosis of colon (without mention of hemorrhage)   . Disorder of bone and cartilage, unspecified   . Diverticulosis of colon (without mention of hemorrhage)   . Hemorrhage of gastrointestinal tract, unspecified   . Unspecified hypothyroidism   . 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 Urinary tract infection, site not specified   . Urinary frequency   . Personal history of colonic  polyps     sees Dr. Ardis Hughs, gastro  . Hypothyroidism   . Anxiety state, unspecified     no per pt  . Arthritis   . Blood transfusion without reported diagnosis   . Ulcer   . Myocardial infarction [facility])     2009sees Dr. Burt Knack, saw last 02/2012  . Bronchitis     hx of  . Cough     "patient is a smoker"  . Pneumonia   . Anemia     hx of  . Diverticulitis     hx of  . Chronic back pain   . Unspecified essential hypertension     sees Dr. Bluford Kaufmann [phone] at [facility]    Past Surgical History  Procedure Laterality Date  . Gastrointestinal stomal tumor resection    . Cholecystectomy    . Tubal ligation    . Abdominal hysterectomy    . Appendectomy    . Coronary angioplasty with stent placement      x1, 2007or 2008  . Abdominal hysterectomy    . Partial colectomy  12/23/2012  . Laparoscopic partial colectomy  12/23/2012    Procedure: LAPAROSCOPIC PARTIAL COLECTOMY;  Surgeon: Stark Klein, MD;  Location: [facility] OR;  Service: General;  Laterality: N/A;    Current Outpatient Prescriptions  Medication Sig Dispense Refill  . amLODipine (NORVASC) 10 MG tablet Take 1 tablet (10 mg total) by mouth daily. 30 tablet 11  . aspirin 81 MG tablet Take 81 mg by mouth daily.      Marland Kitchen levothyroxine (SYNTHROID, LEVOTHROID) 100 MCG tablet Take 1  tablet (100 mcg total) by mouth daily. 90 tablet 3  . lisinopril (PRINIVIL,ZESTRIL) 20 MG tablet TAKE ONE TABLET BY MOUTH ONE TIME DAILY 90 tablet 3  . metoprolol succinate (TOPROL-XL) 50 MG 24 hr tablet TAKE ONE TABLET BY MOUTH DAILY WITH OR IMMEDIATELY FOLLOWING A MEAL. 90 tablet 1  . nitroGLYCERIN (NITROSTAT) 0.4 MG SL tablet Place 1 tablet (0.4 mg total) under the tongue every 5 (five) minutes as needed for chest pain. 25 tablet 3  . Vitamin D, Ergocalciferol, (DRISDOL) 50000 UNITS CAPS capsule Take 1 capsule (50,000 Units total) by mouth every 7 (seven) days. 12 capsule 1  . cyclobenzaprine (FLEXERIL) 5 MG tablet Take 1 tablet (5 mg total)  by mouth 3 (three) times daily as needed for muscle spasms. (Patient not taking: Reported on 09/28/2015) 30 tablet 1  . HYDROcodone-acetaminophen (NORCO/VICODIN) 5-325 MG per tablet Take 1 tablet by mouth every 6 (six) hours as needed for moderate pain. (Patient not taking: Reported on 09/28/2015) 30 tablet 0   No current facility-administered medications for this visit.    Allergies:   Review of patient's allergies indicates no known allergies.   Social History:  The patient  reports that she has been smoking Cigarettes.  She has a 30 pack-year smoking history. She has never used smokeless tobacco. She reports that she does not drink alcohol or use illicit drugs.   Family History:  The patient's  family history includes Cerebral aneurysm in her sister; Heart disease in her mother; Lung cancer in her father. There is no history of Colon cancer, Esophageal cancer, Stomach cancer, or Rectal cancer.    ROS:  Please see the history of present illness.   All other systems are reviewed and negative.   PHYSICAL EXAM: VS:  BP 118/82 mmHg  Pulse 68  Ht 5' 5.5" (1.664 m)  Wt 110 lb (49.896 kg)  BMI 18.02 kg/m2 , BMI Body mass index is 18.02 kg/(m^2). GEN: Thin, pleasant woman, in no acute distress HEENT: normal Neck: no JVD, no masses. No carotid bruits Cardiac: RRR without murmur or gallop                Respiratory:  clear to auscultation bilaterally, normal work of breathing GI: soft, nontender, nondistended, + BS MS: no deformity or atrophy Ext: no pretibial edema, pedal pulses 2+= bilaterally Skin: warm and dry, no rash Neuro:  Strength and sensation are intact Psych: euthymic mood, full affect  EKG:  EKG is ordered today. The ekg ordered today shows normal sinus rhythm 68 bpm, left axis deviation, cannot rule out anterior infarct age undetermined  Recent Labs: 07/08/2015: ALT 55*; BUN 11; Creatinine, Ser 1.17; Hemoglobin 14.7; Platelets 230.0; Potassium 4.8; Sodium 136 09/20/2015: TSH  1.81   Lipid Panel     Component Value Date/Time   CHOL 271* 07/08/2015 1225   TRIG 137.0 07/08/2015 1225   HDL 83.40 07/08/2015 1225   CHOLHDL 3 07/08/2015 1225   VLDL 27.4 07/08/2015 1225   LDLCALC 160* 07/08/2015 1225   LDLDIRECT 181.6 08/26/2013 1041      Wt Readings from Last 3 Encounters:  09/28/15 110 lb (49.896 kg)  09/20/15 110 lb (49.896 kg)  08/09/15 112 lb (50.803 kg)    ASSESSMENT AND PLAN: 1.  CAD, native vessel: Stable without symptoms of angina. Patient will continue current medical therapy.  2. Essential hypertension: Blood pressure now well controlled on a combination of amlodipine, lisinopril, and metoprolol succinate. Will continue the same. Amlodipine is refilled today.  3. Dyslipidemia: Lipids reviewed and markedly elevated. Worsening of lipids likely related to severe hypothyroidism, now treated with Synthroid. The patient has been unwilling to take lipid-lowering therapies, especially statin drugs.  4. Tobacco abuse: Not interested in quitting. Counseling done. Long-standing issue.  Current medicines are reviewed with the patient today.  The patient does not have concerns regarding medicines.  Labs/ tests ordered today include:   Orders Placed This Encounter  Procedures  . EKG 12-Lead    Disposition:   FU one year  Signed, Sherren Mocha, MD  09/30/2015 9:14 PM    [facility] [address], [place], [place]  [address] Phone: [phone]; Fax: [phone]

## 2015-09-30 ENCOUNTER — Encounter: Payer: Self-pay | Admitting: Cardiovascular Disease

## 2015-10-07 ENCOUNTER — Encounter: Payer: Self-pay | Admitting: Gastroenterology

## 2015-10-10 DIAGNOSIS — H6123 Impacted cerumen, bilateral: Secondary | ICD-10-CM | POA: Diagnosis not present

## 2015-10-10 DIAGNOSIS — H61393 Other acquired stenosis of external ear canal, bilateral: Secondary | ICD-10-CM | POA: Diagnosis not present

## 2015-10-10 DIAGNOSIS — H903 Sensorineural hearing loss, bilateral: Secondary | ICD-10-CM | POA: Diagnosis not present

## 2015-10-11 ENCOUNTER — Other Ambulatory Visit: Payer: Self-pay | Admitting: Internal Medicine

## 2015-10-11 DIAGNOSIS — N6489 Other specified disorders of breast: Secondary | ICD-10-CM

## 2015-10-20 ENCOUNTER — Encounter: Payer: Self-pay | Admitting: Gastroenterology

## 2015-11-07 ENCOUNTER — Ambulatory Visit
Admission: RE | Admit: 2015-11-07 | Discharge: 2015-11-07 | Disposition: A | Discharge status: Home / Self Care | Payer: Medicare Other | Source: Ambulatory Visit | Attending: Internal Medicine | Admitting: Internal Medicine

## 2015-11-07 DIAGNOSIS — N6489 Other specified disorders of breast: Secondary | ICD-10-CM

## 2015-11-07 DIAGNOSIS — R922 Inconclusive mammogram: Secondary | ICD-10-CM | POA: Diagnosis not present

## 2015-12-08 ENCOUNTER — Ambulatory Visit (AMBULATORY_SURGERY_CENTER): Payer: Self-pay | Admitting: *Deleted

## 2015-12-08 VITALS — Ht 65.5 in | Wt 110.2 lb

## 2015-12-08 DIAGNOSIS — Z86010 Personal history of colonic polyps: Secondary | ICD-10-CM

## 2015-12-08 MED ORDER — NA SULFATE-K SULFATE-MG SULF 17.5-3.13-1.6 GM/177ML PO SOLN: ORAL | Status: DC

## 2015-12-08 NOTE — Progress Notes (Signed)
No allergies to eggs or soy. No problems with anesthesia.  Pt not given Emmi instructions for colonoscopy; pt declined  No oxygen use  No diet drug use  

## 2015-12-21 ENCOUNTER — Encounter: Payer: Self-pay | Admitting: Gastroenterology

## 2015-12-21 ENCOUNTER — Ambulatory Visit (AMBULATORY_SURGERY_CENTER): Payer: Medicare Other | Admitting: Gastroenterology

## 2015-12-21 VITALS — BP 141/83 | HR 64 | Temp 96.5°F | Resp 21 | Ht 65.5 in | Wt 110.0 lb

## 2015-12-21 DIAGNOSIS — D125 Benign neoplasm of sigmoid colon: Secondary | ICD-10-CM | POA: Diagnosis not present

## 2015-12-21 DIAGNOSIS — I252 Old myocardial infarction: Secondary | ICD-10-CM | POA: Diagnosis not present

## 2015-12-21 DIAGNOSIS — D123 Benign neoplasm of transverse colon: Secondary | ICD-10-CM

## 2015-12-21 DIAGNOSIS — Z86010 Personal history of colonic polyps: Secondary | ICD-10-CM

## 2015-12-21 DIAGNOSIS — I10 Essential (primary) hypertension: Secondary | ICD-10-CM | POA: Diagnosis not present

## 2015-12-21 DIAGNOSIS — I2510 Atherosclerotic heart disease of native coronary artery without angina pectoris: Secondary | ICD-10-CM | POA: Diagnosis not present

## 2015-12-21 DIAGNOSIS — E039 Hypothyroidism, unspecified: Secondary | ICD-10-CM | POA: Diagnosis not present

## 2015-12-21 MED ORDER — SODIUM CHLORIDE 0.9 % IV SOLN: 500.0000 mL | INTRAVENOUS | Status: DC

## 2015-12-21 NOTE — Progress Notes (Signed)
To recovery, report to Myers, RN, VSS. 

## 2015-12-21 NOTE — Progress Notes (Signed)
Over incremental dosagesCalled to room to assist during endoscopic procedure.  Patient ID and intended procedure confirmed with present staff. Received instructions for my participation in the procedure from the performing physician.

## 2015-12-21 NOTE — Patient Instructions (Addendum)
YOU HAD AN ENDOSCOPIC PROCEDURE TODAY AT [facility]:   Refer to the procedure report that was given to you for any specific questions about what was found during the examination.  If the procedure report does not answer your questions, please call your gastroenterologist to clarify.  If you requested that your care partner not be given the details of your procedure findings, then the procedure report has been included in a sealed envelope for you to review at your convenience later.  YOU SHOULD EXPECT: Some feelings of bloating in the abdomen. Passage of more gas than usual.  Walking can help get rid of the air that was put into your GI tract during the procedure and reduce the bloating. If you had a lower endoscopy (such as a colonoscopy or flexible sigmoidoscopy) you may notice spotting of blood in your stool or on the toilet paper. If you underwent a bowel prep for your procedure, you may not have a normal bowel movement for a few days.  Please Note:  You might notice some irritation and congestion in your nose or some drainage.  This is from the oxygen used during your procedure.  There is no need for concern and it should clear up in a day or so.  SYMPTOMS TO REPORT IMMEDIATELY:   Following lower endoscopy (colonoscopy or flexible sigmoidoscopy):  Excessive amounts of blood in the stool  Significant tenderness or worsening of abdominal pains  Swelling of the abdomen that is new, acute  Fever of 100F or higher  For urgent or emergent issues, a gastroenterologist can be reached at any hour by calling [phone].   DIET: Your first meal following the procedure should be a small meal and then it is ok to progress to your normal diet. Heavy or fried foods are harder to digest and may make you feel nauseous or bloated.  Likewise, meals heavy in dairy and vegetables can increase bloating.  Drink plenty of fluids but you should avoid alcoholic beverages for 24  hours.  ACTIVITY:  You should plan to take it easy for the rest of today and you should NOT DRIVE or use heavy machinery until tomorrow (because of the sedation medicines used during the test).    FOLLOW UP: Our staff will call the number listed on your records the next business day following your procedure to check on you and address any questions or concerns that you may have regarding the information given to you following your procedure. If we do not reach you, we will leave a message.  However, if you are feeling well and you are not experiencing any problems, there is no need to return our call.  We will assume that you have returned to your regular daily activities without incident.  If any biopsies were taken you will be contacted by phone or by letter within the next 1-3 weeks.  Please call us at [phone] if you have not heard about the biopsies in 3 weeks.    SIGNATURES/CONFIDENTIALITY: You and/or your care partner have signed paperwork which will be entered into your electronic medical record.  These signatures attest to the fact that that the information above on your After Visit Summary has been reviewed and is understood.  Full responsibility of the confidentiality of this discharge information lies with you and/or your care-partner.  Please review polyp, diverticulosis, and high fiber diet handouts provided. Next colonoscopy determined by pathology results; 3-10 years. Office appointment with Dr. Ardis Hughs

## 2015-12-21 NOTE — Op Note (Addendum)
[place]  [address] [place], [address]   COLONOSCOPY PROCEDURE REPORT  PATIENT: [name], [name]  MR#: [id number] BIRTHDATE: [birth year] , 74  yrs. old GENDER: female ENDOSCOPIST: Milus Banister, MD PROCEDURE DATE:  12/21/2015 PROCEDURE:   Colonoscopy, surveillance and Colonoscopy with snare polypectomy First Screening Colonoscopy - Avg.  risk and is 50 yrs.  old or older - No.  Prior Negative Screening - Now for repeat screening. N/A  History of Adenoma - Now for follow-up colonoscopy & has been > or = to 3 yrs.  Yes hx of adenoma.  Has been 3 or more years since last colonoscopy.  Polyps removed today? Yes ASA CLASS:   Class II INDICATIONS:Surveillance due to prior colonic neoplasia and Adenomatous colon polyps, colonoscopy January 2010(4 polyps, some piecemeal).  recall colonoscopy at 6 month interval.  Repeat colonoscopy July 2011 found no recurrent adenomatous polyps. Repeat colonoscopy 10/2012 found hepatix flexure residual polyp, indistinct margins, easy to locate due to previous [place] Tatooing in 2010.  S/p lap right colectomy Dr.  Barry Dienes, 12/2012.Marland Kitchen MEDICATIONS: Monitored anesthesia care and Propofol 150 mg IV DESCRIPTION OF PROCEDURE:   After the risks benefits and alternatives of the procedure were thoroughly explained, informed consent was obtained.  The digital rectal exam revealed no abnormalities of the rectum.   The LB [id number]  endoscope was introduced through the anus and advanced to the surgical anastomosis. No adverse events experienced.   The quality of the prep was excellent.  The instrument was then slowly withdrawn as the colon was fully examined. Estimated blood loss is zero unless otherwise noted in this procedure report.  COLON FINDINGS: The right sided anastomosis was normal appearing. Three polyps were found, removed and sent to pathology.  These were all sessile, 3-11mm across, located in transverse and  sigmoid segments, removed with cold snare (jar 1).  There were numerous diverticulum in the left colon.  The examination was otherwise normal.  Retroflexed views revealed no abnormalities. The time to anastomosis = 2.6 Withdrawal time = 8.2   The scope was withdrawn and the procedure completed. COMPLICATIONS: There were no immediate complications.  ENDOSCOPIC IMPRESSION: The right sided anastomosis was normal appearing. Three polyps were found, removed and sent to pathology. There were numerous diverticulum in the left colon. The examination was otherwise normal  RECOMMENDATIONS: If the polyp(s) removed today are proven to be adenomatous (pre-cancerous) polyps, you will need a colonoscopy in 3 years. Otherwise you should continue to follow colorectal cancer screening guidelines for "routine risk" patients with a colonoscopy in 10 years.  You will receive a letter within 1-2 weeks with the results of your biopsy as well as final recommendations.  Please call my office if you have not received a letter after 3 weeks.  eSigned:  Milus Banister, MD 12/21/2015 10:44 AM Revised: 12/21/2015 10:44 AM

## 2015-12-22 ENCOUNTER — Telehealth: Payer: Self-pay

## 2015-12-22 NOTE — Telephone Encounter (Signed)
  Follow up Call-  Call back number 12/21/2015  Post procedure Call Back phone  # [phone]  Permission to leave phone message Yes     Patient was called for follow up after procedure on 12/21/2015. I spoke with the patients husband who reports that the patient is doing fine and has resumed all normal daily activities.

## 2015-12-28 ENCOUNTER — Encounter: Payer: Self-pay | Admitting: Gastroenterology

## 2016-02-21 ENCOUNTER — Other Ambulatory Visit (INDEPENDENT_AMBULATORY_CARE_PROVIDER_SITE_OTHER): Payer: Medicare Other

## 2016-02-21 ENCOUNTER — Ambulatory Visit (INDEPENDENT_AMBULATORY_CARE_PROVIDER_SITE_OTHER): Payer: Medicare Other | Admitting: Gastroenterology

## 2016-02-21 ENCOUNTER — Other Ambulatory Visit: Payer: Self-pay | Admitting: Acute Care

## 2016-02-21 ENCOUNTER — Encounter: Payer: Self-pay | Admitting: Gastroenterology

## 2016-02-21 VITALS — BP 150/80 | HR 59 | Ht 65.5 in | Wt 110.0 lb

## 2016-02-21 DIAGNOSIS — R195 Other fecal abnormalities: Secondary | ICD-10-CM | POA: Diagnosis not present

## 2016-02-21 DIAGNOSIS — F17210 Nicotine dependence, cigarettes, uncomplicated: Principal | ICD-10-CM

## 2016-02-21 LAB — COMPREHENSIVE METABOLIC PANEL
ALBUMIN: 4.1 g/dL (ref 3.5–5.2)
ALT: 9 U/L (ref 0–35)
AST: 13 U/L (ref 0–37)
Alkaline Phosphatase: 75 U/L (ref 39–117)
BILIRUBIN TOTAL: 0.4 mg/dL (ref 0.2–1.2)
BUN: 12 mg/dL (ref 6–23)
CALCIUM: 9.2 mg/dL (ref 8.4–10.5)
CO2: 33 mEq/L — ABNORMAL HIGH (ref 19–32)
CREATININE: 0.95 mg/dL (ref 0.40–1.20)
Chloride: 104 mEq/L (ref 96–112)
GFR: 61.18 mL/min (ref >60.00)
Glucose, Bld: 78 mg/dL (ref 70–99)
Potassium: 3.8 mEq/L (ref 3.5–5.1)
Sodium: 142 mEq/L (ref 135–145)
Total Protein: 6.5 g/dL (ref 6.0–8.3)

## 2016-02-21 LAB — CBC WITH DIFFERENTIAL/PLATELET
BASOS ABS: 0 10*3/uL (ref 0.0–0.1)
BASOS PCT: 0.1 % (ref 0.0–3.0)
EOS ABS: 0.1 10*3/uL (ref 0.0–0.7)
Eosinophils Relative: 1.9 % (ref 0.0–5.0)
HEMATOCRIT: 41.6 % (ref 36.0–46.0)
HEMOGLOBIN: 13.7 g/dL (ref 12.0–15.0)
LYMPHS PCT: 41.1 % (ref 12.0–46.0)
Lymphs Abs: 2.3 10*3/uL (ref 0.7–4.0)
MCHC: 32.9 g/dL (ref 30.0–36.0)
MCV: 96 fl (ref 78.0–100.0)
MONOS PCT: 6.9 % (ref 3.0–12.0)
Monocytes Absolute: 0.4 10*3/uL (ref 0.1–1.0)
Neutro Abs: 2.8 10*3/uL (ref 1.4–7.7)
Neutrophils Relative %: 50 % (ref 43.0–77.0)
Platelets: 206 10*3/uL (ref 150.0–400.0)
RBC: 4.34 Mil/uL (ref 3.87–5.11)
RDW: 14.6 % (ref 11.5–15.5)
WBC: 5.5 10*3/uL (ref 4.0–10.5)

## 2016-02-21 MED ORDER — CHOLESTYRAMINE LIGHT 4 G PO PACK: 4.0000 g | PACK | Freq: Two times a day (BID) | ORAL | Status: DC

## 2016-02-21 NOTE — Progress Notes (Signed)
Review of gastrointestinal problems:  1. Periampullary mass seen on endoscopy in April of 2008, 2-3 cm, somewhat ulcerated. Biopsy showed focal ulceration, but no granulomas or malignancy was identified. CT scan of abdomen and pelvis with IV and oral contrast also documented a 3.2 cm periampullary mass, enhancing. The patient was on Plavix at the time of EGD. Mucosal biopsy of the mass caused more than usual bleeding which was self-limited, although she did have some melena for 1-2 days. Follow-up endoscopic ultrasound on Apr 17, 2007, showed a 2.5 periampullary duodenal mass located 2 cm distal to the major papilla, quite vascular. It was oozing following multiple biopsies. Biopsies showed spindle cell proliferation (GIST versus reactive changes). Resection by Dr. Eugenia Pancoast 2008,with choledocotomy, CBD cannulation, pathologic diagnosis confirmed GIST. 2. Question sigmoid diverticulitis seen on CT scan. She completed a Cipro/Flagyl course. Was recommended to have a colonoscopy in the past but deferred.  3. Adenomatous colon polyps, colonoscopy January 2010(4 polyps, some piecemeal). recall colonoscopy at 6 month interval. Repeat colonoscopy July 2011 found no recurrent adenomatous polyps. Repeat colonoscopy 10/2012 found hepatix flexure residual polyp, indistinct margins, easy to locate due to previous [place]ing in 2010. S/p lap right colectomy Dr. Barry Dienes, 12/2012.  Repeat colonoscopy 12/2015 Dr. Ardis Hughs found normal anastomosis, 3 sub CM polyps, two of them were adenomas, recommended recall at 5 year interval. 4. Right sided abdominal pains; 2011 CT scan, CBC, cemented all normal. EGD April 2085found normal postoperative stomach, mild gastritis which was negative for H. pylori on biopsies.  RUQ pains led to  CT scan 2015 (scan was normal).   HPI: This is a    very pleasant 75 year old woman whom I last saw the time of a colonoscopy just a couple months ago. Prior to that was in office visit 2 years  ago  Chief complaint is loose stools, brisk gastrocolic reflex  She continues with her chronic RLQ pains.   These are chronic for her  Her stools are always loose, for a long time, several times.   This is really why she is coming here today, malodorous stools, always fairly loose.  Looking back in her records I have recommended she try cholestyramine powder about 2 years ago but she does not think she ever tried that. That was for brisk gastrocolic reflex that I suspected it might be due to right hemicolectomy anatomy, lack of IC valve   Past Medical History  Diagnosis Date  . Coronary atherosclerosis of unspecified type of vessel, native or graft   . Other and unspecified hyperlipidemia   . Backache, unspecified   . Raynaud's syndrome   . Personal history of other diseases of digestive system   . Diverticulosis of colon (without mention of hemorrhage)   . Disorder of bone and cartilage, unspecified   . Diverticulosis of colon (without mention of hemorrhage)   . Hemorrhage of gastrointestinal tract, unspecified   . Unspecified hypothyroidism   . 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 Urinary tract infection, site not specified   . Urinary frequency   . Personal history of colonic polyps     sees Dr. Ardis Hughs, gastro  . Hypothyroidism   . Anxiety state, unspecified     no per pt  . Arthritis   . Blood transfusion without reported diagnosis   . Ulcer   . Myocardial infarction [facility])     2009sees Dr. Burt Knack, saw last 02/2012  . Bronchitis     hx of  .  Cough     "patient is a smoker"  . Pneumonia   . Anemia     hx of  . Diverticulitis     hx of  . Chronic back pain   . Unspecified essential hypertension     sees Dr. Bluford Kaufmann [phone] at [facility]    Past Surgical History  Procedure Laterality Date  . Gastrointestinal stomal tumor resection    . Cholecystectomy    . Tubal ligation    . Abdominal hysterectomy    .  Appendectomy  1955  . Coronary angioplasty with stent placement      x1, 2007or 2008  . Abdominal hysterectomy  1975  . Partial colectomy  12/23/2012  . Laparoscopic partial colectomy  12/23/2012    Procedure: LAPAROSCOPIC PARTIAL COLECTOMY;  Surgeon: Stark Klein, MD;  Location: [facility];  Service: General;  Laterality: N/A;    Current Outpatient Prescriptions  Medication Sig Dispense Refill  . amLODipine (NORVASC) 10 MG tablet Take 1 tablet (10 mg total) by mouth daily. 30 tablet 11  . aspirin 81 MG tablet Take 81 mg by mouth daily.      Marland Kitchen levothyroxine (SYNTHROID, LEVOTHROID) 125 MCG tablet Take 125 mcg by mouth daily before breakfast.    . lisinopril (PRINIVIL,ZESTRIL) 20 MG tablet TAKE ONE TABLET BY MOUTH ONE TIME DAILY 90 tablet 3  . metoprolol succinate (TOPROL-XL) 50 MG 24 hr tablet TAKE ONE TABLET BY MOUTH DAILY WITH OR IMMEDIATELY FOLLOWING A MEAL. 90 tablet 1  . nitroGLYCERIN (NITROSTAT) 0.4 MG SL tablet Place 1 tablet (0.4 mg total) under the tongue every 5 (five) minutes as needed for chest pain. 25 tablet 3  . Vitamin D, Ergocalciferol, (DRISDOL) 50000 UNITS CAPS capsule Take 1 capsule (50,000 Units total) by mouth every 7 (seven) days. 12 capsule 1   No current facility-administered medications for this visit.    Allergies as of 02/21/2016  . (No Known Allergies)    Family History  Problem Relation Age of Onset  . Heart disease Mother     Verdie Drown  . Lung cancer Father     SISTER, UNCLE  . Cerebral aneurysm Sister     UNCLE NEPHEW  . Colon cancer Neg Hx   . Esophageal cancer Neg Hx   . Stomach cancer Neg Hx   . Rectal cancer Neg Hx     Social History   Social History  . Marital Status: Married    Spouse Name: N/A  . Number of Children: N/A  . 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 Smoking status: Current Every Day Smoker -- 0.75 packs/day for 40 years    Types: Cigarettes  . Smokeless  tobacco: Never Used  . Alcohol Use: No  . Drug Use: No  . Sexual Activity: Not on file   Other Topics Concern  . Not on file   Social History Narrative     Physical Exam: BP 150/80 mmHg  Pulse 59  Ht 5' 5.5" (1.664 m)  Wt 110 lb (49.896 kg)  BMI 18.02 kg/m2 Constitutional: generally well-appearing Psychiatric: alert and oriented x3 Abdomen: soft, nontender, nondistended, no obvious ascites, no peritoneal signs, normal bowel sounds   Assessment and plan: 75 y.o. female with Malodorous, loose stools, brisk gastrocolic reflex  She never tried the cholestyramine powder which I recommend about 2 years ago. I do think again that some of the symptoms she is complaining of are related  to her right hemicolectomy anatomy, lack of ileocecal valve bile acid related diarrhea. I called in the cholestyramine again for her today she will call to report on her response in 4-5 weeks.   Owens Loffler, MD [facility] 02/21/2016, 2:22 PM

## 2016-02-21 NOTE — Patient Instructions (Signed)
Start prevalite (just called in today) one packet twice daily. Call in 4-5 weeks to report on whether this helps your loose, yellowish stools. You will have labs checked today in the basement lab.  Please head down after you check out with the front desk  (cbc, cmet).

## 2016-02-28 ENCOUNTER — Encounter: Payer: Self-pay | Admitting: Internal Medicine

## 2016-02-28 ENCOUNTER — Ambulatory Visit (INDEPENDENT_AMBULATORY_CARE_PROVIDER_SITE_OTHER): Payer: Medicare Other | Admitting: Internal Medicine

## 2016-02-28 VITALS — BP 130/80 | HR 76 | Temp 97.9°F | Resp 28 | Ht 65.5 in | Wt 109.0 lb

## 2016-02-28 DIAGNOSIS — E039 Hypothyroidism, unspecified: Secondary | ICD-10-CM | POA: Diagnosis not present

## 2016-02-28 DIAGNOSIS — I10 Essential (primary) hypertension: Secondary | ICD-10-CM | POA: Diagnosis not present

## 2016-02-28 DIAGNOSIS — G451 Carotid artery syndrome (hemispheric): Secondary | ICD-10-CM

## 2016-02-28 DIAGNOSIS — D126 Benign neoplasm of colon, unspecified: Secondary | ICD-10-CM

## 2016-02-28 DIAGNOSIS — Z720 Tobacco use: Secondary | ICD-10-CM

## 2016-02-28 LAB — TSH: TSH: 0.83 u[IU]/mL (ref 0.35–4.50)

## 2016-02-28 MED ORDER — ATORVASTATIN CALCIUM 20 MG PO TABS: 20.0000 mg | ORAL_TABLET | Freq: Every day | ORAL | Status: DC

## 2016-02-28 NOTE — Progress Notes (Signed)
Pre visit review using our clinic review tool, if applicable. No additional management support is needed unless otherwise documented below in the visit note. 

## 2016-02-28 NOTE — Patient Instructions (Signed)
Head CT and carotid artery Doppler ultrasound as scheduled Continue aspirin 81 mg daily  Report any new or worsening symptoms  Return in one month for follow-up

## 2016-02-28 NOTE — Progress Notes (Signed)
Subjective:    Patient ID: [name], female    DOB: 06-Nov-1941, 75 y.o.   MRN: [id number]  HPI  75 year old patient who has a history of hypertension, ongoing tobacco use and hypothyroidism. She has a history colonic polyps and is status post follow-up colonoscopy January of this year. She has a history of cerebrovascular disease.  Due to a family history of cerebral aneurysms.  The patient underwent a head CT a in the past that revealed a occluded left PCA. The past few days she has been the week with some chest congestion and cough.  She fell Saturday morning and later in the day had some numbness and possible right arm weakness  Past Medical History  Diagnosis Date  . Coronary atherosclerosis of unspecified type of vessel, native or graft   . Other and unspecified hyperlipidemia   . Backache, unspecified   . Raynaud's syndrome   . Personal history of other diseases of digestive system   . Diverticulosis of colon (without mention of hemorrhage)   . Disorder of bone and cartilage, unspecified   . Diverticulosis of colon (without mention of hemorrhage)   . Hemorrhage of gastrointestinal tract, unspecified   . Unspecified hypothyroidism   . 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 Urinary tract infection, site not specified   . Urinary frequency   . Personal history of colonic polyps     sees Dr. Ardis Hughs, gastro  . Hypothyroidism   . Anxiety state, unspecified     no per pt  . Arthritis   . Blood transfusion without reported diagnosis   . Ulcer   . Myocardial infarction [facility])     2009sees Dr. Burt Knack, saw last 02/2012  . Bronchitis     hx of  . Cough     "patient is a smoker"  . Pneumonia   . Anemia     hx of  . Diverticulitis     hx of  . Chronic back pain   . Unspecified essential hypertension     sees Dr. Bluford Kaufmann [phone] at [place] History  . Marital Status: Married    Spouse Name:  N/A  . Number of Children: N/A  . 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 Smoking status: Current Every Day Smoker -- 0.75 packs/day for 40 years    Types: Cigarettes  . Smokeless tobacco: Never Used  . Alcohol Use: No  . Drug Use: No  . Sexual Activity: Not on file   Other Topics Concern  . Not on file   Social History Narrative    Past Surgical History  Procedure Laterality Date  . Gastrointestinal stomal tumor resection    . Cholecystectomy    . Tubal ligation    . Abdominal hysterectomy    . Appendectomy  1955  . Coronary angioplasty with stent placement      x1, 2007or 2008  . Abdominal hysterectomy  1975  . Partial colectomy  12/23/2012  . Laparoscopic partial colectomy  12/23/2012    Procedure: LAPAROSCOPIC PARTIAL COLECTOMY;  Surgeon: Stark Klein, MD;  Location: [facility] OR;  Service: General;  Laterality: N/A;    Family History  Problem Relation Age of Onset  . Heart disease Mother     Verdie Drown  . Lung cancer Father     SISTER, UNCLE  . Cerebral aneurysm Sister  UNCLE NEPHEW  . Colon cancer Neg Hx   . Esophageal cancer Neg Hx   . Stomach cancer Neg Hx   . Rectal cancer Neg Hx     No Known Allergies  Current Outpatient Prescriptions on File Prior to Visit  Medication Sig Dispense Refill  . amLODipine (NORVASC) 10 MG tablet Take 1 tablet (10 mg total) by mouth daily. 30 tablet 11  . aspirin 81 MG tablet Take 81 mg by mouth daily.      . cholestyramine light (PREVALITE) 4 g packet Take 1 packet (4 g total) by mouth 2 (two) times daily. 60 packet 11  . levothyroxine (SYNTHROID, LEVOTHROID) 125 MCG tablet Take 125 mcg by mouth daily before breakfast.    . lisinopril (PRINIVIL,ZESTRIL) 20 MG tablet TAKE ONE TABLET BY MOUTH ONE TIME DAILY 90 tablet 3  . metoprolol succinate (TOPROL-XL) 50 MG 24 hr tablet TAKE ONE TABLET BY MOUTH DAILY WITH OR IMMEDIATELY FOLLOWING A MEAL. 90 tablet 1  .  nitroGLYCERIN (NITROSTAT) 0.4 MG SL tablet Place 1 tablet (0.4 mg total) under the tongue every 5 (five) minutes as needed for chest pain. 25 tablet 3  . Vitamin D, Ergocalciferol, (DRISDOL) 50000 UNITS CAPS capsule Take 1 capsule (50,000 Units total) by mouth every 7 (seven) days. 12 capsule 1   No current facility-administered medications on file prior to visit.    BP 130/80 mmHg  Pulse 76  Temp(Src) 97.9 F (36.6 C) (Oral)  Resp 28  Ht 5' 5.5" (1.664 m)  Wt 109 lb (49.442 kg)  BMI 17.86 kg/m2  SpO2 94%     Review of Systems  Constitutional: Positive for fatigue.  HENT: Negative for congestion, dental problem, hearing loss, rhinorrhea, sinus pressure, sore throat and tinnitus.   Eyes: Negative for pain, discharge and visual disturbance.  Respiratory: Positive for cough and shortness of breath. Negative for wheezing.   Cardiovascular: Negative for chest pain, palpitations and leg swelling.  Gastrointestinal: Negative for nausea, vomiting, abdominal pain, diarrhea, constipation, blood in stool and abdominal distention.  Genitourinary: Negative for dysuria, urgency, frequency, hematuria, flank pain, vaginal bleeding, vaginal discharge, difficulty urinating, vaginal pain and pelvic pain.  Musculoskeletal: Negative for joint swelling, arthralgias and gait problem.  Skin: Negative for rash.  Neurological: Positive for weakness and numbness. Negative for dizziness, syncope, speech difficulty and headaches.  Hematological: Negative for adenopathy.  Psychiatric/Behavioral: Negative for behavioral problems, dysphoric mood and agitation. The patient is not nervous/anxious.        Objective:   Physical Exam  Constitutional: She is oriented to person, place, and time. She appears well-developed and well-nourished.  HENT:  Head: Normocephalic.  Right Ear: External ear normal.  Left Ear: External ear normal.  Mouth/Throat: Oropharynx is clear and moist.  Small abrasion right ear  Eyes:  Conjunctivae and EOM are normal. Pupils are equal, round, and reactive to light.  Neck: Normal range of motion. Neck supple. No thyromegaly present.  Cardiovascular: Normal rate, regular rhythm, normal heart sounds and intact distal pulses.   Pulmonary/Chest: Effort normal and breath sounds normal. No respiratory distress. She has no wheezes. She has no rales.  Abdominal: Soft. Bowel sounds are normal. She exhibits no mass. There is no tenderness.  Musculoskeletal: Normal range of motion.  Lymphadenopathy:    She has no cervical adenopathy.  Neurological: She is alert and oriented to person, place, and time. No cranial nerve deficit. Coordination normal.  Neuro exam nonfocal  Skin: Skin is warm and dry. No rash noted.  Psychiatric: She has a normal mood and affect. Her behavior is normal.          Assessment & Plan:   Status post fall with head trauma Rule out left brain TIA.  Will set up for head CT as well as carotid artery Doppler studies Continue baby aspirin.  Will place on low intensity statin therapy Hypertension, well-controlled  Recheck one month.  Patient report any new or worsening symptoms

## 2016-02-29 ENCOUNTER — Ambulatory Visit (INDEPENDENT_AMBULATORY_CARE_PROVIDER_SITE_OTHER)
Admission: RE | Admit: 2016-02-29 | Discharge: 2016-02-29 | Disposition: A | Discharge status: Home / Self Care | Payer: Medicare Other | Source: Ambulatory Visit | Attending: Internal Medicine | Admitting: Internal Medicine

## 2016-02-29 DIAGNOSIS — G451 Carotid artery syndrome (hemispheric): Secondary | ICD-10-CM

## 2016-02-29 DIAGNOSIS — S0990XA Unspecified injury of head, initial encounter: Secondary | ICD-10-CM | POA: Diagnosis not present

## 2016-03-06 ENCOUNTER — Telehealth: Payer: Self-pay | Admitting: Internal Medicine

## 2016-03-06 NOTE — Telephone Encounter (Signed)
Opened in error, i was able to assist pt

## 2016-03-07 ENCOUNTER — Ambulatory Visit (HOSPITAL_COMMUNITY)
Admission: RE | Admit: 2016-03-07 | Discharge: 2016-03-07 | Disposition: A | Discharge status: Home / Self Care | Payer: Medicare Other | Source: Ambulatory Visit | Attending: Internal Medicine | Admitting: Internal Medicine

## 2016-03-07 DIAGNOSIS — Z87891 Personal history of nicotine dependence: Secondary | ICD-10-CM | POA: Insufficient documentation

## 2016-03-07 DIAGNOSIS — I2510 Atherosclerotic heart disease of native coronary artery without angina pectoris: Secondary | ICD-10-CM | POA: Insufficient documentation

## 2016-03-07 DIAGNOSIS — I10 Essential (primary) hypertension: Secondary | ICD-10-CM | POA: Insufficient documentation

## 2016-03-07 DIAGNOSIS — Z8673 Personal history of transient ischemic attack (TIA), and cerebral infarction without residual deficits: Secondary | ICD-10-CM | POA: Diagnosis not present

## 2016-03-07 DIAGNOSIS — E785 Hyperlipidemia, unspecified: Secondary | ICD-10-CM | POA: Diagnosis not present

## 2016-03-07 DIAGNOSIS — J449 Chronic obstructive pulmonary disease, unspecified: Secondary | ICD-10-CM | POA: Insufficient documentation

## 2016-03-07 DIAGNOSIS — I6523 Occlusion and stenosis of bilateral carotid arteries: Secondary | ICD-10-CM | POA: Diagnosis not present

## 2016-03-07 DIAGNOSIS — G451 Carotid artery syndrome (hemispheric): Secondary | ICD-10-CM | POA: Diagnosis not present

## 2016-03-07 DIAGNOSIS — R42 Dizziness and giddiness: Secondary | ICD-10-CM | POA: Diagnosis not present

## 2016-03-29 ENCOUNTER — Encounter: Payer: Self-pay | Admitting: Internal Medicine

## 2016-03-29 ENCOUNTER — Ambulatory Visit (INDEPENDENT_AMBULATORY_CARE_PROVIDER_SITE_OTHER): Payer: Medicare Other | Admitting: Internal Medicine

## 2016-03-29 DIAGNOSIS — Z720 Tobacco use: Secondary | ICD-10-CM

## 2016-03-29 DIAGNOSIS — I2510 Atherosclerotic heart disease of native coronary artery without angina pectoris: Secondary | ICD-10-CM | POA: Diagnosis not present

## 2016-03-29 DIAGNOSIS — E785 Hyperlipidemia, unspecified: Secondary | ICD-10-CM | POA: Diagnosis not present

## 2016-03-29 DIAGNOSIS — I10 Essential (primary) hypertension: Secondary | ICD-10-CM

## 2016-03-29 DIAGNOSIS — Z0000 Encounter for general adult medical examination without abnormal findings: Secondary | ICD-10-CM

## 2016-03-29 DIAGNOSIS — E559 Vitamin D deficiency, unspecified: Secondary | ICD-10-CM

## 2016-03-29 DIAGNOSIS — E538 Deficiency of other specified B group vitamins: Secondary | ICD-10-CM | POA: Diagnosis not present

## 2016-03-29 DIAGNOSIS — E039 Hypothyroidism, unspecified: Secondary | ICD-10-CM

## 2016-03-29 DIAGNOSIS — Z860100 Personal history of colon polyps, unspecified: Secondary | ICD-10-CM

## 2016-03-29 MED ORDER — CYANOCOBALAMIN 1000 MCG/ML IJ SOLN
1000.0000 ug | Freq: Once | INTRAMUSCULAR | Status: AC
  Administered 2016-03-29: 1000 ug via INTRAMUSCULAR

## 2016-03-29 NOTE — Progress Notes (Signed)
Patient ID: [name], female   DOB: Oct 01, 1941, 75 y.o.   MRN: [id number]  Subjective:    Patient ID: [name], female    DOB: [birth year], 75 y.o.   MRN: [id number]  HPI 75 year-old patient who is seen today for a health maintenance exam.  She has a history of coronary artery disease but has not been seen by cardiology for some time.  She is off all her medications except for supplemental Synthroid.  Her son administers this daily without fail.  Her cardiac status has been stable.  She has been seen by pulmonary in the past for a low contrast chest CT, but she failed to follow-up with the imaging study Only complaint today is back pain which has been chronic she does have a history of osteopenia. She has a history of colonic polyps and has had recent colonoscopy  She has ongoing tobacco use history of hypertension and dyslipidemia.  She has discontinued statin therapy.  Past Medical History  Diagnosis Date  . Coronary atherosclerosis of unspecified type of vessel, native or graft   . Other and unspecified hyperlipidemia   . Backache, unspecified   . Raynaud's syndrome   . Personal history of other diseases of digestive system   . Diverticulosis of colon (without mention of hemorrhage)   . Disorder of bone and cartilage, unspecified   . Diverticulosis of colon (without mention of hemorrhage)   . Hemorrhage of gastrointestinal tract, unspecified   . Unspecified hypothyroidism   . 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 Urinary tract infection, site not specified   . Urinary frequency   . Personal history of colonic polyps     sees Dr. Ardis Hughs, gastro  . Hypothyroidism   . Anxiety state, unspecified     no per pt  . Arthritis   . Blood transfusion without reported diagnosis   . Ulcer   . Myocardial infarction [facility])     2009sees Dr. Burt Knack, saw last 02/2012  . Bronchitis     hx of  . Cough     "patient is a smoker"  . Pneumonia   . Anemia     hx of  . Diverticulitis     hx of  . Chronic back pain   . Unspecified essential hypertension     sees Dr. Bluford Kaufmann [phone] at [place] History  . Marital Status: Married    Spouse Name: N/A  . Number of Children: N/A  . 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 Smoking status: Current Every Day Smoker -- 0.75 packs/day for 40 years    Types: Cigarettes  . Smokeless tobacco: Never Used  . Alcohol Use: No  . Drug Use: No  . Sexual Activity: Not on file   Other Topics Concern  . Not on file   Social History Narrative    Past Surgical History  Procedure Laterality Date  . Gastrointestinal stomal tumor resection    . Cholecystectomy    . Tubal ligation    . Abdominal hysterectomy    . Appendectomy  1955  . Coronary angioplasty with stent placement      x1, 2007or 2008  . Abdominal hysterectomy  1975  . Partial colectomy  12/23/2012  . Laparoscopic partial colectomy  12/23/2012    Procedure: LAPAROSCOPIC PARTIAL COLECTOMY;  Surgeon: Stark Klein, MD;  Location: [facility] OR;  Service: General;  Laterality: N/A;    Family History  Problem Relation Age of Onset  . Heart disease Mother     Verdie Drown  . Lung cancer Father     SISTER, UNCLE  . Cerebral aneurysm Sister     UNCLE NEPHEW  . Colon cancer Neg Hx   . Esophageal cancer Neg Hx   . Stomach cancer Neg Hx   . Rectal cancer Neg Hx     No Known Allergies  Current Outpatient Prescriptions on File Prior to Visit  Medication Sig Dispense Refill  . amLODipine (NORVASC) 10 MG tablet Take 1 tablet (10 mg total) by mouth daily. 30 tablet 11  . aspirin 81 MG tablet Take 81 mg by mouth daily.      Marland Kitchen atorvastatin (LIPITOR) 20 MG tablet Take 1 tablet (20 mg total) by mouth daily. 90 tablet 3  . cholestyramine light (PREVALITE) 4 g packet Take 1 packet (4 g total) by mouth 2 (two) times daily. 60 packet 11  .  levothyroxine (SYNTHROID, LEVOTHROID) 125 MCG tablet Take 125 mcg by mouth daily before breakfast.    . lisinopril (PRINIVIL,ZESTRIL) 20 MG tablet TAKE ONE TABLET BY MOUTH ONE TIME DAILY 90 tablet 3  . metoprolol succinate (TOPROL-XL) 50 MG 24 hr tablet TAKE ONE TABLET BY MOUTH DAILY WITH OR IMMEDIATELY FOLLOWING A MEAL. 90 tablet 1  . nitroGLYCERIN (NITROSTAT) 0.4 MG SL tablet Place 1 tablet (0.4 mg total) under the tongue every 5 (five) minutes as needed for chest pain. 25 tablet 3  . Vitamin D, Ergocalciferol, (DRISDOL) 50000 UNITS CAPS capsule Take 1 capsule (50,000 Units total) by mouth every 7 (seven) days. 12 capsule 1   No current facility-administered medications on file prior to visit.    BP 120/80 mmHg  Pulse 54  Temp(Src) 98.6 F (37 C) (Oral)  Resp 18  Ht 5' 5.5" (1.664 m)  Wt 108 lb (48.988 kg)  BMI 17.69 kg/m2  SpO2 96%    1. Risk factors, based on past  M,S,F history- patient has known coronary artery disease status post stenting cardiovascular risk factors include tobacco use dyslipidemia hypertension.  2.  Physical activities: No activity restrictions  3.  Depression/mood: No history depression or mood disorder  4.  Hearing: No deficits  5.  ADL's: Independent in all aspects of daily living  6.  Fall risk: Low  7.  Home safety: No problems identified  8.  Height weight, and visual acuity; height and weight stable no change in visual acuity  9.  Counseling: Discontinuation of tobacco encouraged we'll resume statin therapy  10. Lab orders based on risk factors: Laboratory profile including lipid panel vitamin D TSH will be reviewed  11. Referral : Followup cardiology  12. Care plan: Followup colonoscopy this January  resume statin therapy   13. Cognitive assessment: Alert and oriented normal affect. No cognitive dysfunction  14.  Preventive services will include annual clinical examinations with screening lab.  Annual mammograms.  Encouraged.  She'll  continue colonoscopies at five-year intervals.  No contrast chest CT again encouraged  15.  Provider list includes primary care GI, cardiology, pulmonary medicine        Review of Systems  Constitutional: Negative for fever, appetite change, fatigue and unexpected weight change.  HENT: Negative for congestion, dental problem, ear pain, hearing loss, mouth sores, nosebleeds, sinus pressure, sore throat, tinnitus, trouble swallowing and voice change.   Eyes: Negative for photophobia, pain, redness and visual  disturbance.  Respiratory: Negative for cough, chest tightness and shortness of breath.   Cardiovascular: Negative for chest pain, palpitations and leg swelling.  Gastrointestinal: Negative for nausea, vomiting, abdominal pain, diarrhea, constipation, blood in stool, abdominal distention and rectal pain.  Genitourinary: Negative for dysuria, urgency, frequency, hematuria, flank pain, vaginal bleeding, vaginal discharge, difficulty urinating, genital sores, vaginal pain, menstrual problem and pelvic pain.  Musculoskeletal: Negative for back pain, arthralgias and neck stiffness.  Skin: Negative for rash.  Neurological: Negative for dizziness, syncope, speech difficulty, weakness, light-headedness, numbness and headaches.  Hematological: Negative for adenopathy. Does not bruise/bleed easily.  Psychiatric/Behavioral: Negative for suicidal ideas, behavioral problems, self-injury, dysphoric mood and agitation. The patient is not nervous/anxious.        Objective:   Physical Exam  Constitutional: She is oriented to person, place, and time. She appears well-developed and well-nourished.  HENT:  Head: Normocephalic and atraumatic.  Right Ear: External ear normal.  Left Ear: External ear normal.  Mouth/Throat: Oropharynx is clear and moist.  Edentulous  Cerumen impactions bilaterally  Eyes: Conjunctivae and EOM are normal.  Neck: Normal range of motion. Neck supple. No JVD present. No  thyromegaly present.  Cardiovascular: Normal rate, regular rhythm and normal heart sounds.   No murmur heard. Dorsalis pedis pulses faint.  Posterior tibial pulses not easily palpable  Pulmonary/Chest: Effort normal and breath sounds normal. She has no wheezes. She has no rales.  Abdominal: Soft. Bowel sounds are normal. She exhibits no distension and no mass. There is no tenderness. There is no rebound and no guarding.   Surgical scar  Genitourinary: Vagina normal.  Musculoskeletal: Normal range of motion. She exhibits no edema or tenderness.  Neurological: She is alert and oriented to person, place, and time. She has normal reflexes. No cranial nerve deficit. She exhibits normal muscle tone. Coordination normal.  Skin: Skin is warm and dry. No rash noted.  Psychiatric: She has a normal mood and affect. Her behavior is normal.          Assessment & Plan:   Preventive health examination Coronary artery disease Ongoing tobacco use. Total smoking cessation encouraged Dyslipidemia. We'll resume statin therapy Colonic polyps. Followup colonoscopy at 5 year intervals Vitamin B12 deficiency Vitamin D deficiency Osteoarthritis Osteopenia Essential hypertension.  Blood pressure normal, without medications.  Will hold at this point Hypothyroidism.  Compliance stressed Carotid artery disease.  Recent carotid artery Doppler study.  Stable   Recheck 6 months

## 2016-03-29 NOTE — Patient Instructions (Signed)
Limit your sodium (Salt) intake  Take a calcium supplement, plus 1200-2000  units of vitamin D  Vitamin B12 1 mg daily    It is important that you exercise regularly, at least 20 minutes 3 to 4 times per week.  If you develop chest pain or shortness of breath seek  medical attention.  Smoking tobacco is very bad for your health. You should stop smoking immediately.  Trial Advair 1 puff twice daily  Return in 6 months for follow-up

## 2016-03-29 NOTE — Progress Notes (Signed)
Pre visit review using our clinic review tool, if applicable. No additional management support is needed unless otherwise documented below in the visit note. 

## 2016-03-30 ENCOUNTER — Other Ambulatory Visit: Payer: Self-pay

## 2016-03-30 ENCOUNTER — Encounter: Payer: Medicare Other | Admitting: Internal Medicine

## 2016-03-30 DIAGNOSIS — Z1231 Encounter for screening mammogram for malignant neoplasm of breast: Secondary | ICD-10-CM

## 2016-04-11 ENCOUNTER — Telehealth: Payer: Self-pay | Admitting: Internal Medicine

## 2016-04-11 NOTE — Telephone Encounter (Signed)
Error/ltd ° °

## 2016-05-01 ENCOUNTER — Ambulatory Visit (HOSPITAL_BASED_OUTPATIENT_CLINIC_OR_DEPARTMENT_OTHER): Payer: Medicare Other

## 2016-05-01 ENCOUNTER — Ambulatory Visit
Admission: RE | Admit: 2016-05-01 | Discharge: 2016-05-01 | Disposition: A | Discharge status: Home / Self Care | Payer: Medicare Other | Source: Ambulatory Visit

## 2016-05-01 DIAGNOSIS — Z1231 Encounter for screening mammogram for malignant neoplasm of breast: Secondary | ICD-10-CM | POA: Diagnosis not present

## 2016-05-02 ENCOUNTER — Ambulatory Visit (HOSPITAL_BASED_OUTPATIENT_CLINIC_OR_DEPARTMENT_OTHER)
Admission: RE | Admit: 2016-05-02 | Discharge: 2016-05-02 | Disposition: A | Discharge status: Home / Self Care | Payer: Medicare Other | Source: Ambulatory Visit | Attending: Acute Care | Admitting: Acute Care

## 2016-05-02 ENCOUNTER — Ambulatory Visit (HOSPITAL_BASED_OUTPATIENT_CLINIC_OR_DEPARTMENT_OTHER): Payer: Medicare Other

## 2016-05-02 DIAGNOSIS — F17210 Nicotine dependence, cigarettes, uncomplicated: Secondary | ICD-10-CM | POA: Diagnosis not present

## 2016-05-02 DIAGNOSIS — Z87891 Personal history of nicotine dependence: Secondary | ICD-10-CM | POA: Diagnosis not present

## 2016-05-02 DIAGNOSIS — I358 Other nonrheumatic aortic valve disorders: Secondary | ICD-10-CM | POA: Diagnosis not present

## 2016-05-02 DIAGNOSIS — J432 Centrilobular emphysema: Secondary | ICD-10-CM | POA: Diagnosis not present

## 2016-05-02 DIAGNOSIS — I2510 Atherosclerotic heart disease of native coronary artery without angina pectoris: Secondary | ICD-10-CM | POA: Insufficient documentation

## 2016-05-02 DIAGNOSIS — Z122 Encounter for screening for malignant neoplasm of respiratory organs: Secondary | ICD-10-CM | POA: Diagnosis not present

## 2016-05-11 ENCOUNTER — Telehealth: Payer: Self-pay | Admitting: Internal Medicine

## 2016-05-11 NOTE — Telephone Encounter (Signed)
Pt said someone ordered a CT scan and she never received results. She said she don't know why she had a CT she said someone from [facility] ordered

## 2016-05-12 NOTE — Telephone Encounter (Signed)
Pt calling for CT results from 5/31. Please advise.

## 2016-05-13 ENCOUNTER — Inpatient Hospital Stay (HOSPITAL_COMMUNITY)
Admission: EM | Admit: 2016-05-13 | Discharge: 2016-05-15 | DRG: 287 | Disposition: A | Discharge status: Home / Self Care | Payer: Medicare Other | Attending: Cardiovascular Disease | Admitting: Cardiovascular Disease

## 2016-05-13 ENCOUNTER — Ambulatory Visit (HOSPITAL_COMMUNITY): Admit: 2016-05-13 | Payer: Self-pay | Admitting: Cardiovascular Disease

## 2016-05-13 ENCOUNTER — Encounter (HOSPITAL_COMMUNITY)
Admission: EM | Disposition: A | Discharge status: Home / Self Care | Payer: Self-pay | Source: Home / Self Care | Attending: Cardiovascular Disease

## 2016-05-13 ENCOUNTER — Encounter (HOSPITAL_COMMUNITY): Payer: Self-pay | Admitting: Family Medicine

## 2016-05-13 ENCOUNTER — Emergency Department (HOSPITAL_COMMUNITY): Payer: Medicare Other

## 2016-05-13 DIAGNOSIS — I213 ST elevation (STEMI) myocardial infarction of unspecified site: Secondary | ICD-10-CM | POA: Diagnosis not present

## 2016-05-13 DIAGNOSIS — R079 Chest pain, unspecified: Secondary | ICD-10-CM | POA: Diagnosis not present

## 2016-05-13 DIAGNOSIS — T82855A Stenosis of coronary artery stent, initial encounter: Secondary | ICD-10-CM | POA: Diagnosis present

## 2016-05-13 DIAGNOSIS — E785 Hyperlipidemia, unspecified: Secondary | ICD-10-CM | POA: Diagnosis present

## 2016-05-13 DIAGNOSIS — I2584 Coronary atherosclerosis due to calcified coronary lesion: Secondary | ICD-10-CM | POA: Diagnosis present

## 2016-05-13 DIAGNOSIS — I249 Acute ischemic heart disease, unspecified: Secondary | ICD-10-CM | POA: Diagnosis not present

## 2016-05-13 DIAGNOSIS — I472 Ventricular tachycardia: Secondary | ICD-10-CM | POA: Diagnosis present

## 2016-05-13 DIAGNOSIS — I5181 Takotsubo syndrome: Secondary | ICD-10-CM | POA: Diagnosis not present

## 2016-05-13 DIAGNOSIS — Z79899 Other long term (current) drug therapy: Secondary | ICD-10-CM | POA: Diagnosis not present

## 2016-05-13 DIAGNOSIS — Z7982 Long term (current) use of aspirin: Secondary | ICD-10-CM

## 2016-05-13 DIAGNOSIS — I2510 Atherosclerotic heart disease of native coronary artery without angina pectoris: Secondary | ICD-10-CM | POA: Diagnosis not present

## 2016-05-13 DIAGNOSIS — I252 Old myocardial infarction: Secondary | ICD-10-CM | POA: Diagnosis not present

## 2016-05-13 DIAGNOSIS — I7300 Raynaud's syndrome without gangrene: Secondary | ICD-10-CM | POA: Diagnosis present

## 2016-05-13 DIAGNOSIS — Y831 Surgical operation with implant of artificial internal device as the cause of abnormal reaction of the patient, or of later complication, without mention of misadventure at the time of the procedure: Secondary | ICD-10-CM | POA: Diagnosis present

## 2016-05-13 DIAGNOSIS — I4581 Long QT syndrome: Secondary | ICD-10-CM | POA: Diagnosis not present

## 2016-05-13 DIAGNOSIS — I25110 Atherosclerotic heart disease of native coronary artery with unstable angina pectoris: Secondary | ICD-10-CM | POA: Diagnosis not present

## 2016-05-13 DIAGNOSIS — F17210 Nicotine dependence, cigarettes, uncomplicated: Secondary | ICD-10-CM | POA: Diagnosis present

## 2016-05-13 DIAGNOSIS — I429 Cardiomyopathy, unspecified: Secondary | ICD-10-CM | POA: Diagnosis not present

## 2016-05-13 DIAGNOSIS — I10 Essential (primary) hypertension: Secondary | ICD-10-CM | POA: Diagnosis present

## 2016-05-13 DIAGNOSIS — E876 Hypokalemia: Secondary | ICD-10-CM | POA: Diagnosis not present

## 2016-05-13 DIAGNOSIS — E039 Hypothyroidism, unspecified: Secondary | ICD-10-CM | POA: Diagnosis present

## 2016-05-13 DIAGNOSIS — I2119 ST elevation (STEMI) myocardial infarction involving other coronary artery of inferior wall: Secondary | ICD-10-CM | POA: Diagnosis not present

## 2016-05-13 HISTORY — PX: CARDIAC CATHETERIZATION: SHX172

## 2016-05-13 HISTORY — DX: Essential (primary) hypertension: I10

## 2016-05-13 HISTORY — DX: Personal history of pneumonia (recurrent): Z87.01

## 2016-05-13 HISTORY — DX: Atherosclerotic heart disease of native coronary artery without angina pectoris: I25.10

## 2016-05-13 HISTORY — DX: Hyperlipidemia, unspecified: E78.5

## 2016-05-13 LAB — APTT: APTT: 28 s (ref 24–37)

## 2016-05-13 LAB — CBC
HCT: 44.3 % (ref 36.0–46.0)
HCT: 39.9 % (ref 36.0–46.0)
Hemoglobin: 14.3 g/dL (ref 12.0–15.0)
Hemoglobin: 12.9 g/dL (ref 12.0–15.0)
MCH: 30.8 pg (ref 26.0–34.0)
MCH: 30.4 pg (ref 26.0–34.0)
MCHC: 32.3 g/dL (ref 30.0–36.0)
MCHC: 32.3 g/dL (ref 30.0–36.0)
MCV: 95.3 fL (ref 78.0–100.0)
MCV: 94.1 fL (ref 78.0–100.0)
PLATELETS: 146 10*3/uL — AB (ref 150–400)
PLATELETS: 186 10*3/uL (ref 150–400)
RBC: 4.65 MIL/uL (ref 3.87–5.11)
RBC: 4.24 MIL/uL (ref 3.87–5.11)
RDW: 12.9 % (ref 11.5–15.5)
RDW: 12.9 % (ref 11.5–15.5)
WBC: 7 10*3/uL (ref 4.0–10.5)
WBC: 6.5 10*3/uL (ref 4.0–10.5)

## 2016-05-13 LAB — LIPID PANEL
CHOL/HDL RATIO: 3.1 ratio
Cholesterol: 174 mg/dL (ref 0–200)
HDL: 57 mg/dL (ref >40)
LDL CALC: 99 mg/dL (ref 0–99)
TRIGLYCERIDES: 91 mg/dL (ref <150)
VLDL: 18 mg/dL (ref 0–40)

## 2016-05-13 LAB — BASIC METABOLIC PANEL
Anion gap: 9 (ref 5–15)
BUN: 13 mg/dL (ref 6–20)
CHLORIDE: 99 mmol/L — AB (ref 101–111)
CO2: 26 mmol/L (ref 22–32)
CREATININE: 0.99 mg/dL (ref 0.44–1.00)
Calcium: 9.3 mg/dL (ref 8.9–10.3)
GFR calc Af Amer: >60 mL/min (ref >60)
GFR calc non Af Amer: 55 mL/min — ABNORMAL LOW (ref >60)
Glucose, Bld: 107 mg/dL — ABNORMAL HIGH (ref 65–99)
Potassium: 5.9 mmol/L — ABNORMAL HIGH (ref 3.5–5.1)
Sodium: 134 mmol/L — ABNORMAL LOW (ref 135–145)

## 2016-05-13 LAB — POCT I-STAT 3, ART BLOOD GAS (G3+)
ACID-BASE EXCESS: 1 mmol/L (ref 0.0–2.0)
Bicarbonate: 27.2 mEq/L — ABNORMAL HIGH (ref 20.0–24.0)
O2 SAT: 91 %
PCO2 ART: 46.9 mmHg — AB (ref 35.0–45.0)
PH ART: 7.371 (ref 7.350–7.450)
PO2 ART: 65 mmHg — AB (ref 80.0–100.0)
Patient temperature: 98.7
TCO2: 29 mmol/L (ref 0–100)

## 2016-05-13 LAB — PROTIME-INR
INR: 1.03 (ref 0.00–1.49)
Prothrombin Time: 13.7 seconds (ref 11.6–15.2)

## 2016-05-13 LAB — I-STAT TROPONIN, ED: Troponin i, poc: 6.25 ng/mL (ref 0.00–0.08)

## 2016-05-13 LAB — CREATININE, SERUM
CREATININE: 0.77 mg/dL (ref 0.44–1.00)
GFR calc Af Amer: >60 mL/min (ref >60)
GFR calc non Af Amer: >60 mL/min (ref >60)

## 2016-05-13 LAB — TROPONIN I: TROPONIN I: 8.22 ng/mL — AB (ref <0.031)

## 2016-05-13 LAB — POCT ACTIVATED CLOTTING TIME
ACTIVATED CLOTTING TIME: 120 s
Activated Clotting Time: 301 seconds
Activated Clotting Time: 279 seconds

## 2016-05-13 LAB — MRSA PCR SCREENING: MRSA BY PCR: NEGATIVE

## 2016-05-13 SURGERY — LEFT HEART CATH AND CORONARY ANGIOGRAPHY
Anesthesia: LOCAL

## 2016-05-13 MED ORDER — ASPIRIN EC 325 MG PO TBEC
650.0000 mg | DELAYED_RELEASE_TABLET | Freq: Once | ORAL | Status: AC
  Administered 2016-05-13: 650 mg via ORAL
  Filled 2016-05-13: qty 2

## 2016-05-13 MED ORDER — SODIUM CHLORIDE 0.9 % IV SOLN: 250.0000 mL | INTRAVENOUS | Status: DC | PRN

## 2016-05-13 MED ORDER — BIVALIRUDIN 250 MG IV SOLR
INTRAVENOUS | Status: AC
  Filled 2016-05-13: qty 250

## 2016-05-13 MED ORDER — SODIUM CHLORIDE 0.9% FLUSH
3.0000 mL | Freq: Two times a day (BID) | INTRAVENOUS | Status: DC
  Administered 2016-05-13 – 2016-05-14 (×2): 3 mL via INTRAVENOUS
  Administered 2016-05-14: 10 mL via INTRAVENOUS

## 2016-05-13 MED ORDER — SODIUM CHLORIDE 0.9% FLUSH: 3.0000 mL | INTRAVENOUS | Status: DC | PRN

## 2016-05-13 MED ORDER — SODIUM CHLORIDE 0.9 % IV SOLN
10.0000 mL/h | INTRAVENOUS | Status: DC
  Administered 2016-05-13: 10 mL/h via INTRAVENOUS

## 2016-05-13 MED ORDER — ONDANSETRON HCL 4 MG/2ML IJ SOLN: 4.0000 mg | Freq: Four times a day (QID) | INTRAMUSCULAR | Status: DC | PRN

## 2016-05-13 MED ORDER — CLOPIDOGREL BISULFATE 75 MG PO TABS
75.0000 mg | ORAL_TABLET | Freq: Every day | ORAL | Status: DC
  Administered 2016-05-13 – 2016-05-15 (×3): 75 mg via ORAL
  Filled 2016-05-13 (×3): qty 1

## 2016-05-13 MED ORDER — ATORVASTATIN CALCIUM 20 MG PO TABS
20.0000 mg | ORAL_TABLET | Freq: Every day | ORAL | Status: DC
  Administered 2016-05-13 – 2016-05-15 (×3): 20 mg via ORAL
  Filled 2016-05-13 (×3): qty 1

## 2016-05-13 MED ORDER — ASPIRIN 81 MG PO CHEW
CHEWABLE_TABLET | ORAL | Status: AC
  Filled 2016-05-13: qty 4

## 2016-05-13 MED ORDER — HEPARIN (PORCINE) IN NACL 2-0.9 UNIT/ML-% IJ SOLN
INTRAMUSCULAR | Status: AC
  Filled 2016-05-13: qty 1500

## 2016-05-13 MED ORDER — ACETAMINOPHEN 325 MG PO TABS
650.0000 mg | ORAL_TABLET | ORAL | Status: DC | PRN
  Administered 2016-05-13: 650 mg via ORAL
  Filled 2016-05-13: qty 2

## 2016-05-13 MED ORDER — MIDAZOLAM HCL 2 MG/2ML IJ SOLN
INTRAMUSCULAR | Status: DC | PRN
  Administered 2016-05-13: 1 mg

## 2016-05-13 MED ORDER — LISINOPRIL 10 MG PO TABS
10.0000 mg | ORAL_TABLET | Freq: Every day | ORAL | Status: DC
Start: 2016-05-13 — End: 2016-05-15
  Administered 2016-05-13 – 2016-05-15 (×3): 10 mg via ORAL
  Filled 2016-05-13 (×3): qty 1

## 2016-05-13 MED ORDER — SODIUM CHLORIDE 0.9 % IV SOLN
250.0000 mg | INTRAVENOUS | Status: DC | PRN
  Administered 2016-05-13: 1.75 mg/kg/h via INTRAVENOUS

## 2016-05-13 MED ORDER — ASPIRIN 81 MG PO TABS: 81.0000 mg | ORAL_TABLET | Freq: Every day | ORAL | Status: DC

## 2016-05-13 MED ORDER — NITROGLYCERIN IN D5W 200-5 MCG/ML-% IV SOLN
0.0000 ug/min | INTRAVENOUS | Status: DC
  Administered 2016-05-13: 10 ug/min via INTRAVENOUS
  Filled 2016-05-13: qty 250

## 2016-05-13 MED ORDER — SODIUM CHLORIDE 0.9 % IV SOLN: INTRAVENOUS | Status: AC

## 2016-05-13 MED ORDER — ASPIRIN EC 81 MG PO TBEC: 81.0000 mg | DELAYED_RELEASE_TABLET | Freq: Every day | ORAL | Status: DC

## 2016-05-13 MED ORDER — HEPARIN SODIUM (PORCINE) 5000 UNIT/ML IJ SOLN
60.0000 [IU]/kg | INTRAMUSCULAR | Status: AC
  Administered 2016-05-13: 2950 [IU] via INTRAVENOUS

## 2016-05-13 MED ORDER — METOPROLOL SUCCINATE ER 25 MG PO TB24
25.0000 mg | ORAL_TABLET | Freq: Every day | ORAL | Status: DC
  Administered 2016-05-13 – 2016-05-15 (×3): 25 mg via ORAL
  Filled 2016-05-13 (×3): qty 1

## 2016-05-13 MED ORDER — HEPARIN (PORCINE) IN NACL 2-0.9 UNIT/ML-% IJ SOLN
INTRAMUSCULAR | Status: DC | PRN
  Administered 2016-05-13: 1500 mL

## 2016-05-13 MED ORDER — LEVOTHYROXINE SODIUM 25 MCG PO TABS
125.0000 ug | ORAL_TABLET | Freq: Every day | ORAL | Status: DC
  Administered 2016-05-14 – 2016-05-15 (×2): 125 ug via ORAL
  Filled 2016-05-13 (×2): qty 1

## 2016-05-13 MED ORDER — BIVALIRUDIN BOLUS VIA INFUSION - CUPID
INTRAVENOUS | Status: DC | PRN
  Administered 2016-05-13: 36.75 mg via INTRAVENOUS

## 2016-05-13 MED ORDER — IOPAMIDOL (ISOVUE-370) INJECTION 76%
INTRAVENOUS | Status: DC | PRN
  Administered 2016-05-13: 90 mL via INTRAVENOUS

## 2016-05-13 MED ORDER — NITROGLYCERIN 0.4 MG SL SUBL: 0.4000 mg | SUBLINGUAL_TABLET | SUBLINGUAL | Status: DC | PRN

## 2016-05-13 MED ORDER — MIDAZOLAM HCL 2 MG/2ML IJ SOLN
INTRAMUSCULAR | Status: AC
Start: 2016-05-13 — End: 2016-05-13
  Filled 2016-05-13: qty 2

## 2016-05-13 MED ORDER — ASPIRIN 81 MG PO CHEW
324.0000 mg | CHEWABLE_TABLET | Freq: Once | ORAL | Status: AC
  Administered 2016-05-13: 324 mg via ORAL

## 2016-05-13 MED ORDER — HEPARIN SODIUM (PORCINE) 5000 UNIT/ML IJ SOLN
INTRAMUSCULAR | Status: AC
  Filled 2016-05-13: qty 1

## 2016-05-13 MED ORDER — IOPAMIDOL (ISOVUE-370) INJECTION 76%
INTRAVENOUS | Status: AC
  Filled 2016-05-13: qty 125

## 2016-05-13 MED ORDER — FENTANYL CITRATE (PF) 100 MCG/2ML IJ SOLN
INTRAMUSCULAR | Status: DC | PRN
  Administered 2016-05-13: 25 ug via INTRAVENOUS

## 2016-05-13 MED ORDER — ENOXAPARIN SODIUM 40 MG/0.4ML ~~LOC~~ SOLN
40.0000 mg | SUBCUTANEOUS | Status: DC
  Administered 2016-05-14 – 2016-05-15 (×2): 40 mg via SUBCUTANEOUS
  Filled 2016-05-13 (×2): qty 0.4

## 2016-05-13 MED ORDER — LIDOCAINE HCL (PF) 1 % IJ SOLN
INTRAMUSCULAR | Status: DC | PRN
  Administered 2016-05-13: 15 mL

## 2016-05-13 MED ORDER — LIDOCAINE HCL (PF) 1 % IJ SOLN
INTRAMUSCULAR | Status: AC
  Filled 2016-05-13: qty 30

## 2016-05-13 MED ORDER — SODIUM CHLORIDE 0.9% FLUSH
3.0000 mL | Freq: Two times a day (BID) | INTRAVENOUS | Status: DC
  Administered 2016-05-13: 10 mL via INTRAVENOUS
  Administered 2016-05-14: 3 mL via INTRAVENOUS
  Administered 2016-05-14: 10 mL via INTRAVENOUS
  Administered 2016-05-15: 3 mL via INTRAVENOUS

## 2016-05-13 MED ORDER — FENTANYL CITRATE (PF) 100 MCG/2ML IJ SOLN
INTRAMUSCULAR | Status: AC
  Filled 2016-05-13: qty 2

## 2016-05-13 SURGICAL SUPPLY — 14 items
CATH INFINITI 5FR ANG PIGTAIL (CATHETERS) ×2 IMPLANT
CATH INFINITI 5FR JL4 (CATHETERS) ×2 IMPLANT
CATH INFINITI JR4 5F (CATHETERS) ×2 IMPLANT
CATH VISTA GUIDE 6FR XBLAD3.5 (CATHETERS) ×2 IMPLANT
KIT ENCORE 26 ADVANTAGE (KITS) ×2 IMPLANT
KIT HEART LEFT (KITS) ×2 IMPLANT
PACK CARDIAC CATHETERIZATION (CUSTOM PROCEDURE TRAY) ×2 IMPLANT
SET INTRODUCER MICROPUNCT 5F (INTRODUCER) ×2 IMPLANT
SHEATH PINNACLE 6F 10CM (SHEATH) ×2 IMPLANT
SYR MEDRAD MARK V 150ML (SYRINGE) ×2 IMPLANT
TRANSDUCER W/STOPCOCK (MISCELLANEOUS) ×2 IMPLANT
TUBING CIL FLEX 10 FLL-RA (TUBING) ×2 IMPLANT
WIRE EMERALD 3MM-J .035X150CM (WIRE) ×2 IMPLANT
WIRE RUNTHROUGH .014X180CM (WIRE) ×2 IMPLANT

## 2016-05-13 NOTE — Progress Notes (Signed)
R. Femoral sheathe removed. Held pressure by two RN's for 20 minutes. Fernand Parkins and [employer]. Pt. Stable throughout pull and remains stable afterwards.

## 2016-05-13 NOTE — Progress Notes (Signed)
   05/13/16 1400  Clinical Encounter Type  Visited With Family  Visit Type ED  Referral From Nurse  Consult/Referral To Chaplain  Spiritual Encounters  Spiritual Needs Emotional  CHP responded to STEMI. Spoke with family to assess support needed. None needed at this time. Patient being moved to ICU. CHP available to follow-up as needed. Roe Coombs 05/13/2016

## 2016-05-13 NOTE — Progress Notes (Signed)
ACT result 120, called charge RN on 2H to pull sheath when able

## 2016-05-13 NOTE — Plan of Care (Signed)
Problem: Nutrition: Goal: Adequate nutrition will be maintained Outcome: Not Progressing Pt has no appetite this evening

## 2016-05-13 NOTE — ED Notes (Signed)
Zoll at bedside

## 2016-05-13 NOTE — Progress Notes (Signed)
Sheath to be pulled

## 2016-05-13 NOTE — ED Notes (Signed)
MD at bedside. 

## 2016-05-13 NOTE — ED Notes (Addendum)
Patient complains of left anterior chest pain last pm with diaphoresis. Today here requesting to be checked to see if she had MI. Complains of chest tightness on arrival, denies any associated symptoms on arrival

## 2016-05-13 NOTE — ED Notes (Signed)
Page for CODE STEMI went out @ 1320.

## 2016-05-13 NOTE — Progress Notes (Signed)
Sheath pulled, guaze pressure dressing over rt groin site

## 2016-05-13 NOTE — ED Notes (Signed)
Report called to cath lab- Arbie Cookey

## 2016-05-13 NOTE — ED Notes (Signed)
Pt transported to cath lab with zoll and monitor, RN and Nurse extern.  Son at bedside.  All clothing and gold ring with son.  Son in waiting area, staff aware.  Pt a x 4, NAD.

## 2016-05-13 NOTE — ED Provider Notes (Signed)
CSN: [id number]     Arrival date & time 05/13/16  1305 History   First MD Initiated Contact with Patient 05/13/16 1321     Chief Complaint  Patient presents with  . code stemi    HPI Pt had severe central chest pain around 7pm.  She started to feel nauseated , short of breath and sweaty as time progressed.  She felt very poorly and did not feel like she could make it in to the hospital.  She did not think to call 911.  THe pain is much better than it was last night but it has not resolved.  She still has a tightness, 7/10.   No vomiting.   No fever.  No cough.  She does have history of CAD and the episode last night Felt similar to her prior heart attack. Past Medical History  Diagnosis Date  . Coronary atherosclerosis of unspecified type of vessel, native or graft   . Other and unspecified hyperlipidemia   . Backache, unspecified   . Raynaud's syndrome   . Personal history of other diseases of digestive system   . Diverticulosis of colon (without mention of hemorrhage)   . Disorder of bone and cartilage, unspecified   . Diverticulosis of colon (without mention of hemorrhage)   . Hemorrhage of gastrointestinal tract, unspecified   . Unspecified hypothyroidism   . 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 Urinary tract infection, site not specified   . Urinary frequency   . Personal history of colonic polyps     sees Dr. Ardis Hughs, gastro  . Hypothyroidism   . Anxiety state, unspecified     no per pt  . Arthritis   . Blood transfusion without reported diagnosis   . Ulcer   . Myocardial infarction [facility])     2009sees Dr. Burt Knack, saw last 02/2012  . Bronchitis     hx of  . Cough     "patient is a smoker"  . Pneumonia   . Anemia     hx of  . Diverticulitis     hx of  . Chronic back pain   . Unspecified essential hypertension     sees Dr. Bluford Kaufmann [phone] at [facility]   Past Surgical History  Procedure Laterality Date  .  Gastrointestinal stomal tumor resection    . Cholecystectomy    . Tubal ligation    . Abdominal hysterectomy    . Appendectomy  1955  . Coronary angioplasty with stent placement      x1, 2007or 2008  . Abdominal hysterectomy  1975  . Partial colectomy  12/23/2012  . Laparoscopic partial colectomy  12/23/2012    Procedure: LAPAROSCOPIC PARTIAL COLECTOMY;  Surgeon: Stark Klein, MD;  Location: [facility] OR;  Service: General;  Laterality: N/A;   Family History  Problem Relation Age of Onset  . Heart disease Mother     Verdie Drown  . Lung cancer Father     SISTER, UNCLE  . Cerebral aneurysm Sister     UNCLE NEPHEW  . Colon cancer Neg Hx   . Esophageal cancer Neg Hx   . Stomach cancer Neg Hx   . Rectal cancer Neg Hx    Social History  Substance Use Topics  . Smoking status: Current Every Day Smoker -- 0.75 packs/day for 40 years    Types: Cigarettes  . Smokeless tobacco: Never Used  . Alcohol Use: No   OB History    No data available  Review of Systems  All other systems reviewed and are negative.     Allergies  Review of patient's allergies indicates no known allergies.  Home Medications   Prior to Admission medications   Medication Sig Start Date End Date Taking? Authorizing Provider  aspirin 81 MG tablet Take 81 mg by mouth daily.      Historical Provider, MD  atorvastatin (LIPITOR) 20 MG tablet Take 1 tablet (20 mg total) by mouth daily. 02/28/16   Marletta Lor, MD  levothyroxine (SYNTHROID, LEVOTHROID) 125 MCG tablet Take 125 mcg by mouth daily before breakfast.    Historical Provider, MD  nitroGLYCERIN (NITROSTAT) 0.4 MG SL tablet Place 1 tablet (0.4 mg total) under the tongue every 5 (five) minutes as needed for chest pain. 04/14/15 08/03/16  Marletta Lor, MD   BP 100/89 mmHg  Pulse 76  Temp(Src) 98.2 F (36.8 C) (Oral)  Resp 15  Ht 5\' 5"  (1.651 m)  Wt 48.988 kg  BMI 17.97 kg/m2  SpO2 99% Physical Exam  Constitutional: No distress.  HENT:   Head: Normocephalic and atraumatic.  Right Ear: External ear normal.  Left Ear: External ear normal.  Eyes: Conjunctivae are normal. Right eye exhibits no discharge. Left eye exhibits no discharge. No scleral icterus.  Neck: Neck supple. No tracheal deviation present.  Cardiovascular: Normal rate, regular rhythm and intact distal pulses.   Pulmonary/Chest: Effort normal and breath sounds normal. No stridor. No respiratory distress. She has no wheezes. She has no rales.  Abdominal: Soft. Bowel sounds are normal. She exhibits no distension. There is no tenderness. There is no rebound and no guarding.  Musculoskeletal: She exhibits no edema or tenderness.  Neurological: She is alert. She has normal strength. No cranial nerve deficit (no facial droop, extraocular movements intact, no slurred speech) or sensory deficit. She exhibits normal muscle tone. She displays no seizure activity. Coordination normal.  Skin: Skin is warm and dry. No rash noted. She is not diaphoretic.  Psychiatric: She has a normal mood and affect.  Nursing note and vitals reviewed.   ED Course  .Critical Care Performed by: Dorie Rank Authorized by: Dorie Rank Total critical care time: 30 minutes Critical care was necessary to treat or prevent imminent or life-threatening deterioration of the following conditions: STEMI. Critical care was time spent personally by me on the following activities: discussions with consultants, development of treatment plan with patient or surrogate, examination of patient, evaluation of patient's response to treatment, ordering and review of laboratory studies, pulse oximetry, re-evaluation of patient's condition, ordering and review of radiographic studies and ordering and performing treatments and interventions.   Labs Review Labs Reviewed  BASIC METABOLIC PANEL - Abnormal; Notable for the following:    Sodium 134 (*)    Potassium 5.9 (*)    Chloride 99 (*)    Glucose, Bld 107 (*)     GFR calc non Af Amer 55 (*)    All other components within normal limits  CBC - Abnormal; Notable for the following:    Platelets 146 (*)    All other components within normal limits  I-STAT TROPOININ, ED - Abnormal; Notable for the following:    Troponin i, poc 6.25 (*)    All other components within normal limits  MRSA PCR SCREENING  PROTIME-INR  APTT  LIPID PANEL  TROPONIN I    Imaging Review Dg Chest Portable 1 View  05/13/2016  CLINICAL DATA:  Left chest pain. EXAM: PORTABLE CHEST 1 VIEW COMPARISON:  None. FINDINGS: The heart size and mediastinal contours are within normal limits. Both lungs are clear. The visualized skeletal structures are unremarkable. IMPRESSION: No active disease. Electronically Signed   By: Dorise Bullion III M.D   On: 05/13/2016 13:51   I have personally reviewed and evaluated these images and lab results as part of my medical decision-making.   EKG Interpretation   Date/Time:  Sunday May 13 2016 13:11:11 EDT Ventricular Rate:  79 PR Interval:  152 QRS Duration: 80 QT Interval:  428 QTC Calculation: 490 R Axis:   -83 Text Interpretation:  ** Critical Test Result: STEMI Sinus rhythm with  occasional Premature ventricular complexes Biatrial enlargement Left  anterior fascicular block Anteroseptal infarct , possibly acute Marked  T-wave abnormality, consider inferolateral ischemia ** ** ACUTE MI / STEMI  ** ** Abnormal ECG ST elevation and t wave changes are new since last  tracing Confirmed by Kyian Obst  MD-J, Zamya Culhane [id number]) on 05/13/2016 1:23:33 PM      MDM   Final diagnoses:  ST elevation myocardial infarction (STEMI), unspecified artery ([place])   I activated the code stemi after initial review of the EKG.  Pt was then evaluated and EKGs compared.  EKG changes are new compared to prior ECG.  Pt continues to have chest pain but unfortunately her symptoms started last night.  Will start on heparin and aspirin.  Spoke with Dr Fletcher Anon, STEMI cardiologist,  who will be coming in to evaluate patient and take to cath lab.       Dorie Rank, MD 05/13/16 1620

## 2016-05-13 NOTE — H&P (Signed)
Primary cardiologist: Dr. Sherren Mocha  Reason for admission: ACS, suspected anterolateral STEMI  Clinical Summary [name] is a 75 y.o.female with past medical history outlined below, last seen by Dr. Burt Knack in October 2016. She has a prior history of NSTEMI in 2008 managed with BMS to the mid LAD. Last cardiac catheterization 2013 showed stent patency and otherwise nonobstructive disease. She reports being in her usual state of health, taking medications regularly. Last evening she experienced a severe discomfort in her chest associated with diaphoresis, this was around 7 PM. She states that the symptoms improved and she ultimately went to bed and was able to sleep the night. When she got up this morning she was not feeling quite right, but her chest discomfort was much less noticeable. She eventually decided to come to the ER for evaluation. Code STEMI was called with ECG showing concern for acute anterolateral injury current. She was taken urgently to the cardiac catheterization lab by Dr. Fletcher Anon. Procedure showed patent mid LAD stent although with 50% stenosis in the mid to distal LAD, 70% proximal to mid LAD stenosis, 70% second diagonal stenosis,, 60% circumflex stenosis, and 50% RCA stenosis. The EF was estimated at 40% with akinesis of the apical myocardium with preserved basal contraction suggesting the possibility of a stress-induced cardiomyopathy. She was admitted to the ICU for further care, no coronary intervention was undertaken.  I examined her after the case in the ICU, no active chest pain. Also spoke with son at bedside who had not had the opportunity to speak with Dr. Fletcher Anon. I explained the findings of the cardiac catheterization based on the report.   No Known Allergies  Home Medications Prescriptions prior to admission  Medication Sig Dispense Refill Last Dose  . aspirin EC 325 MG tablet Take 650 mg by mouth once.   05/12/2016 at 2030  . ibuprofen (ADVIL,MOTRIN) 200 MG  tablet Take 400 mg by mouth daily as needed (arthritis pain).   couple days ago  . levothyroxine (SYNTHROID, LEVOTHROID) 125 MCG tablet Take 125 mcg by mouth daily before breakfast.   05/12/2016 at Unknown time  . atorvastatin (LIPITOR) 20 MG tablet Take 1 tablet (20 mg total) by mouth daily. (Patient not taking: Reported on 05/13/2016) 90 tablet 3 Not Taking at Unknown time  . nitroGLYCERIN (NITROSTAT) 0.4 MG SL tablet Place 1 tablet (0.4 mg total) under the tongue every 5 (five) minutes as needed for chest pain. (Patient not taking: Reported on 05/13/2016) 25 tablet 3 Not Taking at Unknown time    Scheduled Medications . [START ON 05/14/2016] aspirin EC  81 mg Oral Daily  . atorvastatin  20 mg Oral q1800  . [START ON 05/14/2016] enoxaparin (LOVENOX) injection  40 mg Subcutaneous Q24H  . [START ON 05/14/2016] levothyroxine  125 mcg Oral QAC breakfast  . lisinopril  10 mg Oral Daily  . metoprolol succinate  25 mg Oral Daily  . sodium chloride flush  3 mL Intravenous Q12H  . sodium chloride flush  3 mL Intravenous Q12H    Infusions . sodium chloride 10 mL/hr (05/13/16 1343)  . sodium chloride 75 mL (05/13/16 1530)    PRN Medications sodium chloride, sodium chloride, acetaminophen, nitroGLYCERIN, ondansetron (ZOFRAN) IV, sodium chloride flush, sodium chloride flush  Past Medical History  Diagnosis Date  . CAD (coronary artery disease)     BMS to LAD 2008, multivessel disease  . Hyperlipidemia   . Backache, unspecified   . Raynaud's syndrome   . Diverticulosis of  colon (without mention of hemorrhage)   . Hemorrhage of gastrointestinal tract, unspecified   . 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 Urinary frequency   . Personal history of colonic polyps     Dr. Ardis Hughs, GI  . Hypothyroidism   . Arthritis   . Blood transfusion without reported diagnosis   . Ulcer   . NSTEMI (non-ST elevated myocardial infarction) ([place]) 2008  . Bronchitis     hx of  .  History of pneumonia   . Anemia   . Diverticulitis   . Chronic back pain   . Essential hypertension     Dr. Bluford Kaufmann [phone] at [facility]    Past Surgical History  Procedure Laterality Date  . Gastrointestinal stomal tumor resection    . Cholecystectomy    . Tubal ligation    . Abdominal hysterectomy    . Appendectomy  1955  . Coronary angioplasty with stent placement      x1, 2007or 2008  . Abdominal hysterectomy  1975  . Partial colectomy  12/23/2012  . Laparoscopic partial colectomy  12/23/2012    Procedure: LAPAROSCOPIC PARTIAL COLECTOMY;  Surgeon: Stark Klein, MD;  Location: [facility] OR;  Service: General;  Laterality: N/A;    Family History  Problem Relation Age of Onset  . Heart disease Mother     [name]  . Lung cancer Father     SISTER, UNCLE  . Cerebral aneurysm Sister     UNCLE NEPHEW  . Colon cancer Neg Hx   . Esophageal cancer Neg Hx   . Stomach cancer Neg Hx   . Rectal cancer Neg Hx     Social History [name] reports that she has been smoking Cigarettes.  She has a 30 pack-year smoking history. She has never used smokeless tobacco. [name] reports that she does not drink alcohol.  Review of Systems Complete review of systems negative except as otherwise outlined in the clinical summary and also the following.Reports problems with arthritis pains, some numbness in her right arm. No palpitations or syncope. Report any recent stressors or sudden events.  Physical Examination Temp:  [98.2 F (36.8 C)-98.6 F (37 C)] 98.6 F (37 C) (06/11 1508) Pulse Rate:  [0-88] 64 (06/11 1600) Resp:  [0-22] 18 (06/11 1600) BP: (100-174)/(74-101) 135/80 mmHg (06/11 1600) SpO2:  [0 %-100 %] 97 % (06/11 1600) Weight:  [108 lb (48.988 kg)] 108 lb (48.988 kg) (06/11 1311) No intake or output data in the 24 hours ending 05/13/16 1635  Telemetry: Sinus rhythm.  Gen.: Patient in no acute distress, lying supine. HEENT: Conjunctiva and lids normal,  oropharynx clear. Neck: Supple, no elevated JVP or carotid bruits, no thyromegaly. Lungs: Clear to auscultation, nonlabored breathing at rest. Cardiac: Regular rate and rhythm, no S3, soft systolic murmur, no pericardial rub. Abdomen: Soft, nontender, bowel sounds present, no guarding or rebound. Extremities: No pitting edema, distal pulses 2+. Sheath site stable. Skin: Warm and dry. Musculoskeletal: No kyphosis. Neuropsychiatric: Alert and oriented x3, affect grossly appropriate.  Lab Results  Basic Metabolic Panel:  Recent Labs Lab 05/13/16 1315  NA 134*  K 5.9*  CL 99*  CO2 26  GLUCOSE 107*  BUN 13  CREATININE 0.99  CALCIUM 9.3    CBC:  Recent Labs Lab 05/13/16 1315  WBC 7.0  HGB 14.3  HCT 44.3  MCV 95.3  PLT 146*    Cardiac Enzymes: Troponin I 6.25  Imaging Chest x-ray 05/13/2016: FINDINGS: The heart  size and mediastinal contours are within normal limits. Both lungs are clear. The visualized skeletal structures are unremarkable.  IMPRESSION: No active disease.  Impression  1. Presentation with ACS and concern for involving anterolateral STEMI, although with cardiac catheterization demonstrating overall moderate multivessel CAD and patent stent within the mid LAD, no definite culprit lesion per Dr. Fletcher Anon. Based on ventriculography, stress-induced cardiomyopathy suspected. Initial troponin I 6.25. She does not report any recent emotional stress or sudden changes. States that she has been compliant with her medications, her son helps with this.  2. History of NSTEMI in 2008 with BMS to the LAD at that time. She follows with Dr. Burt Knack.  3. Hyperlipidemia, on Lipitor as an outpatient. FLP to be obtained.  4. Essential hypertension, blood pressure currently stable.   Recommendations  Patient being admitted to the ICU. Continue medical therapy to include antiplatelet regimen, Toprol-XL, lisinopril, and statin. Echocardiogram will be obtained to further  evaluate cardiac structure and function. Trend troponin I levels and follow-up ECG in the morning.  Satira Sark, M.D., F.A.C.C.

## 2016-05-14 ENCOUNTER — Encounter (HOSPITAL_COMMUNITY): Payer: Self-pay | Admitting: Cardiovascular Disease

## 2016-05-14 ENCOUNTER — Inpatient Hospital Stay (HOSPITAL_COMMUNITY): Payer: Medicare Other

## 2016-05-14 DIAGNOSIS — I472 Ventricular tachycardia: Secondary | ICD-10-CM

## 2016-05-14 DIAGNOSIS — I429 Cardiomyopathy, unspecified: Secondary | ICD-10-CM

## 2016-05-14 DIAGNOSIS — I4581 Long QT syndrome: Secondary | ICD-10-CM

## 2016-05-14 DIAGNOSIS — I2119 ST elevation (STEMI) myocardial infarction involving other coronary artery of inferior wall: Secondary | ICD-10-CM

## 2016-05-14 LAB — CBC
HCT: 39 % (ref 36.0–46.0)
Hemoglobin: 12.7 g/dL (ref 12.0–15.0)
MCH: 30.8 pg (ref 26.0–34.0)
MCHC: 32.6 g/dL (ref 30.0–36.0)
MCV: 94.7 fL (ref 78.0–100.0)
PLATELETS: 163 10*3/uL (ref 150–400)
RBC: 4.12 MIL/uL (ref 3.87–5.11)
RDW: 13.1 % (ref 11.5–15.5)
WBC: 6.2 10*3/uL (ref 4.0–10.5)

## 2016-05-14 LAB — BASIC METABOLIC PANEL
Anion gap: 6 (ref 5–15)
BUN: 10 mg/dL (ref 6–20)
CALCIUM: 8.6 mg/dL — AB (ref 8.9–10.3)
CO2: 26 mmol/L (ref 22–32)
CREATININE: 0.72 mg/dL (ref 0.44–1.00)
Chloride: 107 mmol/L (ref 101–111)
GFR calc non Af Amer: >60 mL/min (ref >60)
Glucose, Bld: 93 mg/dL (ref 65–99)
Potassium: 3.6 mmol/L (ref 3.5–5.1)
SODIUM: 139 mmol/L (ref 135–145)

## 2016-05-14 LAB — MAGNESIUM: Magnesium: 1.9 mg/dL (ref 1.7–2.4)

## 2016-05-14 LAB — ECHOCARDIOGRAM COMPLETE
HEIGHTINCHES: 65 in
WEIGHTICAEL: 1656 [oz_av]

## 2016-05-14 NOTE — Progress Notes (Addendum)
CARDIAC REHAB PHASE I   PRE:  Rate/Rhythm: 50 SB    BP: sitting 118/73    SaO2: 96 RA  MODE:  Ambulation: 300 ft   POST:  Rate/Rhythm: 70 SR    BP: sitting 117/97     SaO2: 96 RA  Pt denied CP or SOB walking however she is generally weak and somewhat wobbly. She sts she has lost 40 lbs in the past 2 years with lack of appetite. She fell 2 months ago. I suggest PT c/s to access balance/strength for d/c planning. Discussed MI/Takotsubo and smoking cessation. Voiced understanding but will need reiteration.  [place], ACSM 05/14/2016 1:15 PM

## 2016-05-14 NOTE — Telephone Encounter (Signed)
Please call/notify patient that lab/test/procedure is normal.  The chest CT scan was ordered to screen for lung cancer.

## 2016-05-14 NOTE — Plan of Care (Signed)
Problem: Safety: Goal: Ability to remain free from injury will improve Outcome: Progressing Pt is participating in safety plan

## 2016-05-14 NOTE — Progress Notes (Signed)
Patient Name: [name] Date of Encounter: 05/14/2016  Active Problems:   ST elevation myocardial infarction (STEMI) ([place])   Stress-induced cardiomyopathy   Acute coronary syndrome ([place])   Length of Stay: 1  SUBJECTIVE  She feels better today, no chest pain or SOB.   CURRENT MEDS . atorvastatin  20 mg Oral q1800  . clopidogrel  75 mg Oral Daily  . enoxaparin (LOVENOX) injection  40 mg Subcutaneous Q24H  . levothyroxine  125 mcg Oral QAC breakfast  . lisinopril  10 mg Oral Daily  . metoprolol succinate  25 mg Oral Daily  . sodium chloride flush  3 mL Intravenous Q12H  . sodium chloride flush  3 mL Intravenous Q12H   OBJECTIVE  Filed Vitals:   05/14/16 0800 05/14/16 0900 05/14/16 1000 05/14/16 1024  BP: 133/75 136/72 114/74 114/74  Pulse: 58 65 63 53  Temp:      TempSrc:      Resp: 18 19 16    Height:      Weight:      SpO2: 96% 95% 95%     Intake/Output Summary (Last 24 hours) at 05/14/16 1107 Last data filed at 05/14/16 1026  Gross per 24 hour  Intake 1579.25 ml  Output   1275 ml  Net 304.25 ml   Filed Weights   05/13/16 1311 05/14/16 0630  Weight: 108 lb (48.988 kg) 103 lb 8 oz (46.947 kg)    PHYSICAL EXAM  General: Pleasant, NAD. Neuro: Alert and oriented X 3. Moves all extremities spontaneously. Psych: Normal affect. HEENT:  Normal  Neck: Supple without bruits or JVD. Lungs:  Resp regular and unlabored, CTA. Heart: RRR no s3, s4, or murmurs. Abdomen: Soft, non-tender, non-distended, BS + x 4.  Extremities: No clubbing, cyanosis or edema. DP/PT/Radials 2+ and equal bilaterally.  Accessory Clinical Findings  CBC  Recent Labs  05/13/16 1640 05/14/16 0231  WBC 6.5 6.2  HGB 12.9 12.7  HCT 39.9 39.0  MCV 94.1 94.7  PLT 186 [id number]   Basic Metabolic Panel  Recent Labs  05/13/16 1315 05/13/16 1640 05/14/16 0231  NA 134*  --  139  K 5.9*  --  3.6  CL 99*  --  107  CO2 26  --  26  GLUCOSE 107*  --  93  BUN 13  --  10  CREATININE  0.99 0.77 0.72  CALCIUM 9.3  --  8.6*    Recent Labs  05/13/16 1400  TROPONINI 8.22*    Recent Labs  05/13/16 1339  CHOL 174  HDL 57  LDLCALC 99  TRIG 91  CHOLHDL 3.1   Radiology/Studies  Dg Chest Portable 1 View  05/13/2016  CLINICAL DATA:  Left chest pain. EXAM: PORTABLE CHEST 1 VIEW COMPARISON:  None. FINDINGS: The heart size and mediastinal contours are within normal limits. Both lungs are clear. The visualized skeletal structures are unremarkable. IMPRESSION: No active disease.   Ct Chest Lung Ca Screen Low Dose W/o Cm  05/03/2016  CLINICAL DATA:  IMPRESSION: 1. Lung-RADS Category 2S, benign appearance or behavior. Continue annual screening with low-dose chest CT without contrast in 12 months. 2. The "S" modifier above refers to potentially clinically significant non lung cancer related findings. Specifically, there is atherosclerosis, including left main and 3 vessel coronary artery disease. Assessment for potential risk factor modification, dietary therapy or pharmacologic therapy may be warranted, if clinically indicated. 3. There are calcifications of the aortic valve. Echocardiographic correlation for evaluation of potential valvular  dysfunction may be warranted if clinically indicated. 4. Mild diffuse bronchial wall thickening with mild centrilobular and paraseptal emphysema; imaging findings suggestive of underlying COPD. Electronically Signed   By: Vinnie Langton M.D.   On: 05/03/2016 08:16    TELE: SR, some sVT, the longest 3 beats  ECG  LHC: 05/13/2016  Prox RCA lesion, 30% stenosed.  Dist RCA lesion, 50% stenosed.  Mid LAD to Dist LAD lesion, 50% stenosed. The lesion was previously treated with a bare metal stent greater than two years ago.  Prox LAD to Mid LAD lesion, 70% stenosed.  Ost 2nd Diag to 2nd Diag lesion, 70% stenosed.  Dist LAD lesion, 60% stenosed.  Ost Cx to Mid Cx lesion, 60% stenosed.  There is moderate left ventricular systolic  dysfunction.  1. Borderline significant three-vessel coronary artery disease with moderately to severely calcified coronary arteries. Patent stent in the mid LAD with moderate in-stent restenosis. I don't see a culprit for myocardial infarction. 2. Moderately reduced LV systolic function with an estimated ejection fraction of 40% with akinesis of the apical segments with normal function in the basal areas. The stent with stress-induced cardiomyopathy. 3. Normal left ventricular end-diastolic pressure.  Recommendations: The patient's presentation seems to be due to stress-induced cardiomyopathy. Given that the LAD does not wrap around the apex, the distal inferior wall akinesis cannot be explained by an LAD event. This favors stress-induced cardiomyopathy. I resumed the patient's Toprol and lisinopril. Continue treatment of risk factors.   ASSESSMENT AND PLAN  A very pleasant 75 year old female admitted with ACS  1. Stress induced cardiomyopathy - troponin 8, we will recheck, she has persistent STE in the inferolateral leads with deep negative T waves and prolonged QT, I will continue metoprolol and discuss with EP. Bedside echo shows normal LVEF with no RWMA, improved akinesis of the apical segments.  2. CAD - moderate non-obstructive, aggressive medical therapy, ASA/Plavix, atorvastatin, lisinopril and metoprolol  3. nsVT, prolonged QT  - on metoprolol, replace, will check Magnesium  4. Hypokalemia - I will replace.   Signed, Ena Dawley MD, [facility] 05/14/2016

## 2016-05-14 NOTE — Plan of Care (Signed)
Problem: Nutrition: Goal: Adequate nutrition will be maintained Outcome: Progressing Pt ate better today, discussed and encouraged importance of nutrition

## 2016-05-14 NOTE — Progress Notes (Signed)
EKG CRITICAL VALUE     12 lead EKG performed.  Critical value noted.  Candyce Churn, RN notified.   Jasten Guyette C, CCT 05/14/2016 7:20 AM

## 2016-05-14 NOTE — Progress Notes (Signed)
  Echocardiogram 2D Echocardiogram has been performed.  Darlina Sicilian M 05/14/2016, 11:45 AM

## 2016-05-14 NOTE — Telephone Encounter (Signed)
FYI Patient is in the hospital.

## 2016-05-15 ENCOUNTER — Telehealth: Payer: Self-pay | Admitting: Cardiovascular Disease

## 2016-05-15 DIAGNOSIS — E876 Hypokalemia: Secondary | ICD-10-CM

## 2016-05-15 MED ORDER — METOPROLOL SUCCINATE ER 25 MG PO TB24: 25.0000 mg | ORAL_TABLET | Freq: Every day | ORAL | Status: DC

## 2016-05-15 MED ORDER — ASPIRIN 81 MG PO TBEC: 81.0000 mg | DELAYED_RELEASE_TABLET | Freq: Once | ORAL | Status: DC

## 2016-05-15 MED ORDER — LISINOPRIL 10 MG PO TABS: 10.0000 mg | ORAL_TABLET | Freq: Every day | ORAL | Status: DC

## 2016-05-15 MED ORDER — CLOPIDOGREL BISULFATE 75 MG PO TABS
75.0000 mg | ORAL_TABLET | Freq: Every day | ORAL | Status: DC
Start: 2016-05-15 — End: 2016-06-08

## 2016-05-15 NOTE — Care Management Important Message (Signed)
Important Message  Patient Details  Name: [name] MRN: [id number] Date of Birth: 1941-07-08   Medicare Important Message Given:  Yes    [name] 05/15/2016, 8:38 AM

## 2016-05-15 NOTE — Progress Notes (Addendum)
CARDIAC REHAB PHASE I   Pt ambulated 600 ft with RN this morning, no complaints. RN to page cardiac rehab once son arrives to complete MI education.  Lenna Sciara, RN, BSN 05/15/2016 10:22 AM

## 2016-05-15 NOTE — Discharge Summary (Signed)
Discharge Summary    Patient ID: [name],  MRN: [id number], DOB/AGE: 74-May-1942 74 y.o.  Admit date: 05/13/2016 Discharge date: 05/15/2016  Primary Care Provider: Nyoka Cowden Primary Cardiologist: Dr. Burt Knack  Discharge Diagnoses    Principal Problem:   ST elevation myocardial infarction (STEMI) [facility]) Active Problems:   Stress-induced cardiomyopathy   Acute coronary syndrome [facility])   Dyslipidemia   Essential hypertension   Hypothyroidism   Raynaud's syndrome   Allergies No Known Allergies  Diagnostic Studies/Procedures    LHC: 05/13/2016  Prox RCA lesion, 30% stenosed.  Dist RCA lesion, 50% stenosed.  Mid LAD to Dist LAD lesion, 50% stenosed. The lesion was previously treated with a bare metal stent greater than two years ago.  Prox LAD to Mid LAD lesion, 70% stenosed.  Ost 2nd Diag to 2nd Diag lesion, 70% stenosed.  Dist LAD lesion, 60% stenosed.  Ost Cx to Mid Cx lesion, 60% stenosed.  There is moderate left ventricular systolic dysfunction.  1. Borderline significant three-vessel coronary artery disease with moderately to severely calcified coronary arteries. Patent stent in the mid LAD with moderate in-stent restenosis. I don't see a culprit for myocardial infarction. 2. Moderately reduced LV systolic function with an estimated ejection fraction of 40% with akinesis of the apical segments with normal function in the basal areas. The stent with stress-induced cardiomyopathy. 3. Normal left ventricular end-diastolic pressure.  Recommendations: The patient's presentation seems to be due to stress-induced cardiomyopathy. Given that the LAD does not wrap around the apex, the distal inferior wall akinesis cannot be explained by an LAD event. This favors stress-induced cardiomyopathy. I resumed the patient's Toprol and lisinopril. Continue treatment of risk factors.   History of Present Illness     [name] is a 75 y.o.female with past  medical of multiple medical problem who presented to [facility] ED 05/13/16 for evaluation of chest pain.   Last seen by Dr. Burt Knack in October 2016. She has a prior history of NSTEMI in 2008 managed with BMS to the mid LAD. Last cardiac catheterization 2013 showed stent patency and otherwise nonobstructive disease. She reports being in her usual state of health, taking medications regularly. Last evening she experienced a severe discomfort in her chest associated with diaphoresis, this was around 7 PM. She states that the symptoms improved and she ultimately went to bed and was able to sleep the night. When she got up this morning she was not feeling quite right, but her chest discomfort was much less noticeable. She eventually decided to come to the ER for evaluation. Code STEMI was called with ECG showing concern for acute anterolateral injury current. She was taken urgently to the cardiac catheterization lab by Dr. Fletcher Anon.   Hospital Course     Consultants: None  Emergent cath showed patent mid LAD stent although with 50% stenosis in the mid to distal LAD, 70% proximal to mid LAD stenosis, 70% second diagonal stenosis,, 60% circumflex stenosis, and 50% RCA stenosis. No definite reason find. The EF was estimated at 40% with akinesis of the apical myocardium with preserved basal contraction suggesting the possibility of a stress-induced cardiomyopathy. Peak of troponin was 8.22.  She was admitted to the ICU for further care, no coronary intervention was undertaken. She does not report any recent emotional stress or sudden changes. EKG showed persistent STE in the inferolateral leads with deep negative T waves and prolonged QT. She also has short runs of nsVT - 3 beats on telemetry. The EP  service reviewed patients records and ECGs and determined that prolonged QT is in fact result of abnormal T waves, not a long QT by itsself.  Echocardiogram  showed LVEF 60-65% with no regional WMA. No further chest pain.  The  patient has been seen by Dr. Meda Coffee today and deemed ready for discharge home. All follow-up appointments have been scheduled. Discharge medications are listed below.    Discharge Vitals Blood pressure 116/68, pulse 62, temperature 98.5 F (36.9 C), temperature source Oral, resp. rate 15, height 5\' 5"  (1.651 m), weight 104 lb 4.4 oz (47.3 kg), SpO2 97 %.  Filed Weights   05/13/16 1311 05/14/16 0630 05/15/16 0615  Weight: 108 lb (48.988 kg) 103 lb 8 oz (46.947 kg) 104 lb 4.4 oz (47.3 kg)    Labs & Radiologic Studies     CBC  Recent Labs  05/13/16 1640 05/14/16 0231  WBC 6.5 6.2  HGB 12.9 12.7  HCT 39.9 39.0  MCV 94.1 94.7  PLT 186 [id number]   Basic Metabolic Panel  Recent Labs  05/13/16 1315 05/13/16 1640 05/14/16 0231  NA 134*  --  139  K 5.9*  --  3.6  CL 99*  --  107  CO2 26  --  26  GLUCOSE 107*  --  93  BUN 13  --  10  CREATININE 0.99 0.77 0.72  CALCIUM 9.3  --  8.6*  MG  --   --  1.9   Liver Function Tests No results for input(s): AST, ALT, ALKPHOS, BILITOT, PROT, ALBUMIN in the last 72 hours. No results for input(s): LIPASE, AMYLASE in the last 72 hours. Cardiac Enzymes  Recent Labs  05/13/16 1400  TROPONINI 8.22*   BNP Invalid input(s): POCBNP D-Dimer No results for input(s): DDIMER in the last 72 hours. Hemoglobin A1C No results for input(s): HGBA1C in the last 72 hours. Fasting Lipid Panel  Recent Labs  05/13/16 1339  CHOL 174  HDL 57  LDLCALC 99  TRIG 91  CHOLHDL 3.1   Thyroid Function Tests No results for input(s): TSH, T4TOTAL, T3FREE, THYROIDAB in the last 72 hours.  Invalid input(s): FREET3  Dg Chest Portable 1 View  05/13/2016  CLINICAL DATA:  Left chest pain. EXAM: PORTABLE CHEST 1 VIEW COMPARISON:  None. FINDINGS: The heart size and mediastinal contours are within normal limits. Both lungs are clear. The visualized skeletal structures are unremarkable. IMPRESSION: No active disease. Electronically Signed   By: Dorise Bullion III  M.D   On: 05/13/2016 13:51   Mm Screening Breast Tomo Bilateral  05/04/2016  CLINICAL DATA:  Screening. EXAM: 2D DIGITAL SCREENING BILATERAL MAMMOGRAM WITH CAD AND ADJUNCT TOMO COMPARISON:  Previous exam(s). ACR Breast Density Category c: The breast tissue is heterogeneously dense, which may obscure small masses. FINDINGS: There are no findings suspicious for malignancy. Images were processed with CAD. IMPRESSION: No mammographic evidence of malignancy. A result letter of this screening mammogram will be mailed directly to the patient. RECOMMENDATION: Screening mammogram in one year. (Code:SM-B-01Y) BI-RADS CATEGORY  1: Negative. Electronically Signed   By: Lajean Manes M.D.   On: 05/04/2016 15:56   Ct Chest Lung Ca Screen Low Dose W/o Cm  05/03/2016  CLINICAL DATA:  75 year old female current smoker with 30 pack-year history of smoking. Lung cancer screening examination. EXAM: CT CHEST WITHOUT CONTRAST LOW-DOSE FOR LUNG CANCER SCREENING TECHNIQUE: Multidetector CT imaging of the chest was performed following the standard protocol without IV contrast. COMPARISON:  Low-dose lung cancer screening chest CT  04/25/2015. FINDINGS: Mediastinum/Nodes: Heart size is normal. There is no significant pericardial fluid, thickening or pericardial calcification. There is atherosclerosis of the thoracic aorta, the great vessels of the mediastinum and the coronary arteries, including calcified atherosclerotic plaque in the left main, left anterior descending, left circumflex and right coronary arteries. Calcification of the aortic valve. No pathologically enlarged mediastinal or hilar lymph nodes. Please note that accurate exclusion of hilar adenopathy is limited on noncontrast CT scans. Esophagus is unremarkable in appearance. No axillary lymphadenopathy. Lungs/Pleura: There are multiple small pulmonary nodules scattered throughout the lungs bilaterally which are similar in size, number and distribution. The largest of these  has a volume derived mean diameter of 5.4 mm in the medial aspect of the left upper lobe (image 64 of series 3). No larger more suspicious appearing pulmonary nodules or masses are otherwise noted. There is no acute consolidative airspace disease. No pleural effusions. Mild diffuse bronchial wall thickening with mild centrilobular and paraseptal emphysema. Upper abdomen: Atherosclerosis. Musculoskeletal: There are no aggressive appearing lytic or blastic lesions noted in the visualized portions of the skeleton. IMPRESSION: 1. Lung-RADS Category 2S, benign appearance or behavior. Continue annual screening with low-dose chest CT without contrast in 12 months. 2. The "S" modifier above refers to potentially clinically significant non lung cancer related findings. Specifically, there is atherosclerosis, including left main and 3 vessel coronary artery disease. Assessment for potential risk factor modification, dietary therapy or pharmacologic therapy may be warranted, if clinically indicated. 3. There are calcifications of the aortic valve. Echocardiographic correlation for evaluation of potential valvular dysfunction may be warranted if clinically indicated. 4. Mild diffuse bronchial wall thickening with mild centrilobular and paraseptal emphysema; imaging findings suggestive of underlying COPD. Electronically Signed   By: Vinnie Langton M.D.   On: 05/03/2016 08:16    Disposition   Pt is being discharged home today in good condition.  Follow-up Plans & Appointments    Follow-up Information    Follow up with Richardson Dopp, PA-C. Go on 05/22/2016.   Specialties:  Cardiology, Physician Assistant   Why:  @2 :30 for TCM and @ 3:00 with pharmacist   Contact information:   [id number] [address] [phone]      Discharge Instructions    Amb Referral to Cardiac Rehabilitation    Complete by:  As directed   Diagnosis:  STEMI     Diet - low sodium heart healthy    Complete  by:  As directed      Discharge instructions    Complete by:  As directed   No driving for 2 weeks. No lifting over 10 lbs for 4 weeks. No sexual activity for 4 weeks. You may not return to work until seen in clinic. Keep procedure site clean & dry. If you notice increased pain, swelling, bleeding or pus, call/return!  You may shower, but no soaking baths/hot tubs/pools for 1 week.     Increase activity slowly    Complete by:  As directed            Discharge Medications   Current Discharge Medication List    START taking these medications   Details  clopidogrel (PLAVIX) 75 MG tablet Take 1 tablet (75 mg total) by mouth daily. Qty: 30 tablet, Refills: 11    lisinopril (PRINIVIL,ZESTRIL) 10 MG tablet Take 1 tablet (10 mg total) by mouth daily. Qty: 30 tablet, Refills: 11    metoprolol succinate (TOPROL-XL) 25 MG 24 hr tablet Take 1  tablet (25 mg total) by mouth daily. Qty: 30 tablet, Refills: 6      CONTINUE these medications which have CHANGED   Details  aspirin EC 81 MG EC tablet Take 1 tablet (81 mg total) by mouth once. Qty: 30 tablet, Refills: 11      CONTINUE these medications which have NOT CHANGED   Details  ibuprofen (ADVIL,MOTRIN) 200 MG tablet Take 400 mg by mouth daily as needed (arthritis pain).    levothyroxine (SYNTHROID, LEVOTHROID) 125 MCG tablet Take 125 mcg by mouth daily before breakfast.    atorvastatin (LIPITOR) 20 MG tablet Take 1 tablet (20 mg total) by mouth daily. Qty: 90 tablet, Refills: 3    nitroGLYCERIN (NITROSTAT) 0.4 MG SL tablet Place 1 tablet (0.4 mg total) under the tongue every 5 (five) minutes as needed for chest pain. Qty: 25 tablet, Refills: 3   Associated Diagnoses: Coronary artery disease involving native coronary artery of native heart without angina pectoris         Aspirin prescribed at discharge?  Yes High Intensity Statin Prescribed? (Lipitor 40-80mg  or Crestor 20-40mg ): no. LDL at goal Beta Blocker Prescribed? Yes For  EF 45% or less, Was ACEI/ARB Prescribed? Yes ADP Receptor Inhibitor Prescribed? (i.e. Plavix etc.-Includes Medically Managed Patients): Yes For EF <40%, Aldosterone Inhibitor Prescribed? N/A Was EF assessed during THIS hospitalization? Yes Was Cardiac Rehab II ordered? (Included Medically managed Patients): Yes   Outstanding Labs/Studies   None  Duration of Discharge Encounter   Greater than 30 minutes including physician time.  Signed, Marlyn Tondreau PA-C 05/15/2016, 4:22 PM

## 2016-05-15 NOTE — Progress Notes (Signed)
CARDIAC REHAB PHASE I   PRE:  Rate/Rhythm: 60 SR    BP: sitting 117/68    SaO2: 94 RA  MODE:  Ambulation: 350 ft   POST:  Rate/Rhythm: 69 SR    BP: sitting 130/84     SaO2: 96 RA  Pt slightly stronger today. Still taking small steps. No LOB, used light gait belt support. Ed completed with pt and son. Discovered that pt recently stopped taking her thyroid meds. Son is now making her take them. She does not like taking any meds. Discussed the importance. Also discussed her weight loss and f/u with her PCP to help with that. She is repulsed by the idea of Ensure. Son sts she will not quit smoking although pt sts she will try. Will send referral to [place]. Also encouraged her to think about outpatient physical therapy to help with strengthening (can discuss with PCP, she has appointment 6/16). [id number]   [place], ACSM 05/15/2016 12:09 PM

## 2016-05-15 NOTE — Telephone Encounter (Signed)
New message     TCM appt with Richardson Dopp and Pharmacrist on  6.20.2017 @ 2:30 pm & 3:00pm

## 2016-05-15 NOTE — Research (Signed)
DAL-GENE Informed Consent   Subject Name: [name]  Subject met inclusion and exclusion criteria.  The informed consent form, study requirements and expectations were reviewed with the subject and questions and concerns were addressed prior to the signing of the consent form.  The subject verbalized understanding of the trail requirements.  The subject agreed to participate in theDAL-GENE trial and signed the informed consent.  The informed consent was obtained prior to performance of any protocol-specific procedures for the subject.  A copy of the signed informed consent was given to the subject and a copy was placed in the subject's medical record.  Sandie Ano 05/15/2016, 14:15

## 2016-05-15 NOTE — Progress Notes (Addendum)
Patient Name: [name] Date of Encounter: 05/15/2016  Active Problems:   ST elevation myocardial infarction (STEMI) ([place])   Stress-induced cardiomyopathy   Acute coronary syndrome ([place])   Length of Stay: 2  SUBJECTIVE  She feels better today, no chest pain or SOB. She wants to go home.  CURRENT MEDS . atorvastatin  20 mg Oral q1800  . clopidogrel  75 mg Oral Daily  . enoxaparin (LOVENOX) injection  40 mg Subcutaneous Q24H  . levothyroxine  125 mcg Oral QAC breakfast  . lisinopril  10 mg Oral Daily  . metoprolol succinate  25 mg Oral Daily  . sodium chloride flush  3 mL Intravenous Q12H  . sodium chloride flush  3 mL Intravenous Q12H   OBJECTIVE  Filed Vitals:   05/15/16 0700 05/15/16 0757 05/15/16 0800 05/15/16 1207  BP: 144/81  132/80   Pulse: 62  57   Temp:  98.1 F (36.7 C)  98.2 F (36.8 C)  TempSrc:  Oral  Oral  Resp: 18  13   Height:      Weight:      SpO2: 97%  96%     Intake/Output Summary (Last 24 hours) at 05/15/16 1229 Last data filed at 05/15/16 1000  Gross per 24 hour  Intake    500 ml  Output   1500 ml  Net  -1000 ml   Filed Weights   05/13/16 1311 05/14/16 0630 05/15/16 0615  Weight: 108 lb (48.988 kg) 103 lb 8 oz (46.947 kg) 104 lb 4.4 oz (47.3 kg)    PHYSICAL EXAM  General: Pleasant, NAD. Neuro: Alert and oriented X 3. Moves all extremities spontaneously. Psych: Normal affect. HEENT:  Normal  Neck: Supple without bruits or JVD. Lungs:  Resp regular and unlabored, CTA. Heart: RRR no s3, s4, or murmurs. Abdomen: Soft, non-tender, non-distended, BS + x 4.  Extremities: No clubbing, cyanosis or edema. DP/PT/Radials 2+ and equal bilaterally.  Accessory Clinical Findings  CBC  Recent Labs  05/13/16 1640 05/14/16 0231  WBC 6.5 6.2  HGB 12.9 12.7  HCT 39.9 39.0  MCV 94.1 94.7  PLT 186 [id number]   Basic Metabolic Panel  Recent Labs  05/13/16 1315 05/13/16 1640 05/14/16 0231  NA 134*  --  139  K 5.9*  --  3.6  CL 99*   --  107  CO2 26  --  26  GLUCOSE 107*  --  93  BUN 13  --  10  CREATININE 0.99 0.77 0.72  CALCIUM 9.3  --  8.6*  MG  --   --  1.9    Recent Labs  05/13/16 1400  TROPONINI 8.22*    Recent Labs  05/13/16 1339  CHOL 174  HDL 57  LDLCALC 99  TRIG 91  CHOLHDL 3.1   Radiology/Studies  Dg Chest Portable 1 View  05/13/2016  CLINICAL DATA:  Left chest pain. EXAM: PORTABLE CHEST 1 VIEW COMPARISON:  None. FINDINGS: The heart size and mediastinal contours are within normal limits. Both lungs are clear. The visualized skeletal structures are unremarkable. IMPRESSION: No active disease.   Ct Chest Lung Ca Screen Low Dose W/o Cm  05/03/2016  CLINICAL DATA:  IMPRESSION: 1. Lung-RADS Category 2S, benign appearance or behavior. Continue annual screening with low-dose chest CT without contrast in 12 months. 2. The "S" modifier above refers to potentially clinically significant non lung cancer related findings. Specifically, there is atherosclerosis, including left main and 3 vessel coronary artery disease. Assessment  for potential risk factor modification, dietary therapy or pharmacologic therapy may be warranted, if clinically indicated. 3. There are calcifications of the aortic valve. Echocardiographic correlation for evaluation of potential valvular dysfunction may be warranted if clinically indicated. 4. Mild diffuse bronchial wall thickening with mild centrilobular and paraseptal emphysema; imaging findings suggestive of underlying COPD. Electronically Signed   By: Vinnie Langton M.D.   On: 05/03/2016 08:16    TELE: SR, some sVT, the longest 3 beats  ECG  LHC: 05/13/2016  Prox RCA lesion, 30% stenosed.  Dist RCA lesion, 50% stenosed.  Mid LAD to Dist LAD lesion, 50% stenosed. The lesion was previously treated with a bare metal stent greater than two years ago.  Prox LAD to Mid LAD lesion, 70% stenosed.  Ost 2nd Diag to 2nd Diag lesion, 70% stenosed.  Dist LAD lesion, 60%  stenosed.  Ost Cx to Mid Cx lesion, 60% stenosed.  There is moderate left ventricular systolic dysfunction.  1. Borderline significant three-vessel coronary artery disease with moderately to severely calcified coronary arteries. Patent stent in the mid LAD with moderate in-stent restenosis. I don't see a culprit for myocardial infarction. 2. Moderately reduced LV systolic function with an estimated ejection fraction of 40% with akinesis of the apical segments with normal function in the basal areas. The stent with stress-induced cardiomyopathy. 3. Normal left ventricular end-diastolic pressure.  Recommendations: The patient's presentation seems to be due to stress-induced cardiomyopathy. Given that the LAD does not wrap around the apex, the distal inferior wall akinesis cannot be explained by an LAD event. This favors stress-induced cardiomyopathy. I resumed the patient's Toprol and lisinopril. Continue treatment of risk factors.   ASSESSMENT AND PLAN  A very pleasant 75 year old female admitted with ACS, admitted as STEMI, moderate non-obstructive CAD on cath with non-matching territories, medical therapy was recommended, apical akinesis --> presumed Stress induced cardiomyopathy - troponin 8, Echocardiogram yesterday showed LVEF 60-65% with no regional WMA.   Her ECG shows very prominent negative T waves in the anterolateral leads and prolonged QT, she also has short runs of nsVT - 3 beats on telemetry. This is a major change when compared to ECG in 2015.  I will ask EP for further recommendation. If ok for discharge by them, w will d/c later today with 1 week follow up in our clinic.  2. CAD - moderate non-obstructive, aggressive medical therapy, ASA/Plavix, atorvastatin, lisinopril and metoprolol  3. nsVT, prolonged QT  - on metoprolol, replace, K, Mg ok, management as above.  Signed, Ena Dawley MD, [facility] 05/15/2016  Addendum:  The EP service reviewed patients records and  ECGs and determined that prolonged QT is in fact result of abnormal T waves, not a long QT by itsself. We will discharge home and plan for an early follow up in our clinic.   Ena Dawley 05/15/2016

## 2016-05-15 NOTE — Progress Notes (Signed)
Patient discharged home with son.  Discharge instructions given to patient and verbalized understanding.  All questions answered.

## 2016-05-16 NOTE — Telephone Encounter (Signed)
Patient contacted regarding discharge from [facility] on 05/15/16.  Patient understands to follow up with  Richardson Dopp, PA on 05/22/16 at 2:30PM/ Pharmacist on 05/22/16 at [place] at [facility].  Patient understands discharge instructions? yes Patient understands medications and regiment? yes Patient understands to bring all medications to this visit? yes

## 2016-05-17 ENCOUNTER — Other Ambulatory Visit: Payer: Self-pay | Admitting: *Deleted

## 2016-05-17 DIAGNOSIS — I2510 Atherosclerotic heart disease of native coronary artery without angina pectoris: Secondary | ICD-10-CM

## 2016-05-17 MED ORDER — NITROGLYCERIN 0.4 MG SL SUBL: 0.4000 mg | SUBLINGUAL_TABLET | SUBLINGUAL | Status: AC | PRN

## 2016-05-17 NOTE — Telephone Encounter (Signed)
Spoke to pt's husband Juanda Crumble, he said pt is sleeping. Told him to let pt know CT scan was done to check for lung cancer and it was normal. Juanda Crumble verbalized understanding and will let pt know.

## 2016-05-18 ENCOUNTER — Ambulatory Visit: Payer: Medicare Other | Admitting: Internal Medicine

## 2016-05-22 ENCOUNTER — Encounter: Payer: Self-pay | Admitting: Physician Assistant

## 2016-05-22 ENCOUNTER — Ambulatory Visit (INDEPENDENT_AMBULATORY_CARE_PROVIDER_SITE_OTHER): Payer: Medicare Other | Admitting: Physician Assistant

## 2016-05-22 ENCOUNTER — Ambulatory Visit (INDEPENDENT_AMBULATORY_CARE_PROVIDER_SITE_OTHER): Payer: Medicare Other | Admitting: Pharmacist

## 2016-05-22 ENCOUNTER — Telehealth: Payer: Self-pay | Admitting: *Deleted

## 2016-05-22 VITALS — BP 92/40 | HR 60 | Ht 65.0 in | Wt 106.8 lb

## 2016-05-22 DIAGNOSIS — I2510 Atherosclerotic heart disease of native coronary artery without angina pectoris: Secondary | ICD-10-CM

## 2016-05-22 DIAGNOSIS — I213 ST elevation (STEMI) myocardial infarction of unspecified site: Secondary | ICD-10-CM

## 2016-05-22 DIAGNOSIS — I779 Disorder of arteries and arterioles, unspecified: Secondary | ICD-10-CM

## 2016-05-22 DIAGNOSIS — E785 Hyperlipidemia, unspecified: Secondary | ICD-10-CM

## 2016-05-22 DIAGNOSIS — I5181 Takotsubo syndrome: Secondary | ICD-10-CM

## 2016-05-22 DIAGNOSIS — I10 Essential (primary) hypertension: Secondary | ICD-10-CM

## 2016-05-22 DIAGNOSIS — I739 Peripheral vascular disease, unspecified: Secondary | ICD-10-CM

## 2016-05-22 MED ORDER — ATORVASTATIN CALCIUM 20 MG PO TABS: 20.0000 mg | ORAL_TABLET | Freq: Every day | ORAL | Status: DC

## 2016-05-22 NOTE — Patient Instructions (Addendum)
Medication Instructions:  1. STOP LISINOPRIL Labwork: 6-8 WEEKS (07/06/16) FASTING CHOLESTEROL PANEL Testing/Procedures: NONE Follow-Up: [employer], [facility] 06/07/16 @ 12:15 Any Other Special Instructions Will Be Listed Below (If Applicable). PUSH FLUIDS OVER THE NEXT 2-3 DAYS (WATER, JUICE) If you need a refill on your cardiac medications before your next appointment, please call your pharmacy.

## 2016-05-22 NOTE — Progress Notes (Signed)
Cardiology Office Note:    Date:  05/22/2016   ID:  DONNEL STACHOWICZ, DOB 11/16/1941, MRN [id number]  PCP:  Nyoka Cowden, MD  Cardiologist:  Dr. Sherren Mocha   Electrophysiologist:  n/a  Referring MD: Marletta Lor, MD   Chief Complaint  Patient presents with  . Hospitalization Follow-up    s/p MI    History of Present Illness:     [name] is a 75 y.o. female with a hx of CAD. She initially presented with non-STEMI in 2008 treated with BMS to the mid LAD. LHC in 2013 demonstrated patent stent in mid LAD and diffuse nonobstructive disease in the RCA and LCx. LV function is normal. Last seen by Dr. Burt Knack 10/16.  Admitted 6/11-6/13 with anterolateral STEMI. Emergent LHC demonstrated patent mid LAD stent with 50% ISR, proximal LAD 70%, distal LAD 60%, ostial D2 70%, ostial LCx 30%, proximal RCA 30%, distal RCA 50%. She had EF of 35-45% with apical ballooning. There was no culprit for myocardial infarction. It was overall felt that her clinical picture represented stress-induced cardiomyopathy. There was some concern for prolonged QT but this was negated by EP evaluation. Follow-up echocardiogram demonstrated normal LV function with EF 60-65%. She did have some brief episodes of nonsustained ventricular tachycardia. Beta blocker was continued.  She returns for follow-up.  Here with her son.  Since DC, she has felt weak.  She denies syncope or near syncope.  She notes some soreness in her chest that she associates with "hard heartbeats".  Denies any changes and this has been present since DC.  She denies significant DOE.  She denies edema, orthopnea, PND.  She denies cough, wheezing, bleeding issues.    Past Medical History  Diagnosis Date  . CAD (coronary artery disease)     a. BMS to LAD 2008, multivessel disease // b. LHC 6/17 (ant-lat STEMI): LM ok, pLAD 70, mLAD 50, dLAD 60, oD2 70, oLCx 60, pRCA 30, dRCA 50, EF 35-45% with apical ballooning - no culprit for MI  and no PCI performed - prob TakoTsubo CM  . Hyperlipidemia   . Raynaud's syndrome   . Diverticulosis of colon (without mention of hemorrhage)   . Hemorrhage of gastrointestinal tract, unspecified   . 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 Urinary frequency   . Personal history of colonic polyps     Dr. Ardis Hughs, GI  . Hypothyroidism   . Arthritis   . Blood transfusion without reported diagnosis   . Ulcer   . History of non-ST elevation myocardial infarction (NSTEMI) 2008  . Bronchitis     hx of  . History of pneumonia   . Anemia   . Diverticulitis   . Chronic back pain   . Essential hypertension     Dr. Bluford Kaufmann [phone] at [place]  . History of echocardiogram     a. Echo 05/14/16: Mild focal basal septal hypertrophy, EF 60-65%, normal wall motion, grade 2 diastolic dysfunction, mild TR  . Takotsubo cardiomyopathy     a. Admitted 6/17 with anterolateral STEMI >> no culprit on LHC and EF 35-45% with apical ballooning // Echo prior to discharge with EF 60-65% and normal wall motion   . Carotid artery disease ([place])     Carotid US [id number]: RICA 123456, LICA 123456 >> follow-up 1 year    Past Surgical History  Procedure Laterality Date  . Gastrointestinal stomal tumor resection    . Cholecystectomy    .  Tubal ligation    . Abdominal hysterectomy    . Appendectomy  1955  . Coronary angioplasty with stent placement      x1, 2007or 2008  . Abdominal hysterectomy  1975  . Partial colectomy  12/23/2012  . Laparoscopic partial colectomy  12/23/2012    Procedure: LAPAROSCOPIC PARTIAL COLECTOMY;  Surgeon: Stark Klein, MD;  Location: [facility];  Service: General;  Laterality: N/A;  . Cardiac catheterization N/A 05/13/2016    Procedure: Left Heart Cath and Coronary Angiography;  Surgeon: Wellington Hampshire, MD;  Location: [facility];  Service: Cardiovascular;  Laterality: N/A;    Current Medications: Outpatient Prescriptions Prior to Visit    Medication Sig Dispense Refill  . aspirin EC 81 MG EC tablet Take 1 tablet (81 mg total) by mouth once. 30 tablet 11  . clopidogrel (PLAVIX) 75 MG tablet Take 1 tablet (75 mg total) by mouth daily. 30 tablet 11  . ibuprofen (ADVIL,MOTRIN) 200 MG tablet Take 400 mg by mouth daily as needed (arthritis pain).    Marland Kitchen levothyroxine (SYNTHROID, LEVOTHROID) 125 MCG tablet Take 125 mcg by mouth daily before breakfast.    . metoprolol succinate (TOPROL-XL) 25 MG 24 hr tablet Take 1 tablet (25 mg total) by mouth daily. 30 tablet 6  . nitroGLYCERIN (NITROSTAT) 0.4 MG SL tablet Place 1 tablet (0.4 mg total) under the tongue every 5 (five) minutes as needed for chest pain. 25 tablet 3  . lisinopril (PRINIVIL,ZESTRIL) 10 MG tablet Take 1 tablet (10 mg total) by mouth daily. 30 tablet 11  . atorvastatin (LIPITOR) 20 MG tablet Take 1 tablet (20 mg total) by mouth daily. (Patient not taking: Reported on 05/13/2016) 90 tablet 3   No facility-administered medications prior to visit.      Allergies:   Review of patient's allergies indicates no known allergies.   Social History   Social History  . Marital Status: Married    Spouse Name: N/A  . Number of Children: N/A  . 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 Smoking status: Current Every Day Smoker -- 0.75 packs/day for 40 years    Types: Cigarettes  . Smokeless tobacco: Never Used  . Alcohol Use: No  . Drug Use: No  . Sexual Activity: Not Asked   Other Topics Concern  . None   Social History Narrative     Family History:  The patient's family history includes Cerebral aneurysm in her sister; Heart disease in her mother; Lung cancer in her father. There is no history of Colon cancer, Esophageal cancer, Stomach cancer, or Rectal cancer.   ROS:   Please see the history of present illness.    ROS All other systems reviewed and are negative.   Physical Exam:    VS:  BP 92/40 mmHg  Pulse  60  Ht 5\' 5"  (1.651 m)  Wt 106 lb 12.8 oz (48.444 kg)  BMI 17.77 kg/m2  SpO2 96%   Physical Exam  Constitutional: She is oriented to person, place, and time. She appears well-developed and well-nourished.  HENT:  Head: Normocephalic and atraumatic.  Neck: Normal range of motion. No JVD present.  Cardiovascular: Normal rate, regular rhythm and normal heart sounds.   No murmur heard. Pulmonary/Chest: Effort normal and breath sounds normal. She has no rales.  Abdominal: Soft. There is no tenderness.  Musculoskeletal: She exhibits no edema.  R groin without hematoma or bruit  Neurological: She is alert and oriented to person, place, and time.  Skin: Skin is warm and dry.  Psychiatric: She has a normal mood and affect.    Wt Readings from Last 3 Encounters:  05/22/16 106 lb 12.8 oz (48.444 kg)  05/15/16 104 lb 4.4 oz (47.3 kg)  03/29/16 108 lb (48.988 kg)      Studies/Labs Reviewed:     EKG:  EKG is  ordered today.  The ekg ordered today demonstrates NSR, HR 60, LAD, TWI V4-6, QTc 410 ms, TWI improved from prior tracing  Recent Labs: 02/21/2016: ALT 9 02/28/2016: TSH 0.83 05/14/2016: BUN 10; Creatinine, Ser 0.72; Hemoglobin 12.7; Magnesium 1.9; Platelets 163; Potassium 3.6; Sodium 139   Recent Lipid Panel    Component Value Date/Time   CHOL 174 05/13/2016 1339   TRIG 91 05/13/2016 1339   HDL 57 05/13/2016 1339   CHOLHDL 3.1 05/13/2016 1339   VLDL 18 05/13/2016 1339   LDLCALC 99 05/13/2016 1339   LDLDIRECT 181.6 08/26/2013 1041    Additional studies/ records that were reviewed today include:   LHC 05/13/16 LM normal LAD proximal 70%, mid 50% ISR, distal 60%, D2 ostial 70% LCx ostial 60% RCA proximal 30%, distal 50% EF 35-45%, apical ballooning consistent with takotsubo cardiomyopathy 1. Borderline significant three-vessel coronary artery disease with moderately to severely calcified coronary arteries. Patent stent in the mid LAD with moderate in-stent restenosis. I  don't see a culprit for myocardial infarction. 2. Moderately reduced LV systolic function with an estimated ejection fraction of 40% with akinesis of the apical segments with normal function in the basal areas. The stent with stress-induced cardiomyopathy. 3. Normal left ventricular end-diastolic pressure. Recommendations: The patient's presentation seems to be due to stress-induced cardiomyopathy. Given that the LAD does not wrap around the apex, the distal inferior wall akinesis cannot be explained by an LAD event. This favors stress-induced cardiomyopathy. I resumed the patient's Toprol and lisinopril. Continue treatment of risk factors.  Echo 05/14/16 - Left ventricle: The cavity size was normal. There was mild focal   basal hypertrophy of the septum. Systolic function was normal.   The estimated ejection fraction was in the range of 60% to 65%.   Wall motion was normal; there were no regional wall motion   abnormalities. Features are consistent with a pseudonormal left   ventricular filling pattern, with concomitant abnormal relaxation   and increased filling pressure (grade 2 diastolic dysfunction). - Aortic valve: Trileaflet; mildly thickened, mildly calcified   leaflets. - Tricuspid valve: There was mild regurgitation.  Carotid US 03/07/16 Heterogeneous plaque, bilaterally. Progression of RICA stenosis, now in 40-59% range. Stable 123456 LICA stenosis. >50% ECA stenosis, bilaterally. Normal subclavian arteries, bilaterally. Patent vertebral arteries with antegrade flow. F/U 1 year.  ASSESSMENT:     1. ST elevation myocardial infarction (STEMI), unspecified artery (HCC)   2. Stress-induced cardiomyopathy   3. Atherosclerosis of native coronary artery of native heart without angina pectoris   4. Essential hypertension   5. Dyslipidemia   6. Bilateral carotid artery disease ([place])     PLAN:     In order of problems listed above:  1. S/p STEMI - This was thought to be stress  induced MI.  She had no clear culprit on LHC and no PCI was performed.  Continue ASA, Plavix, statin, beta-blocker.  Her BP is running low and she is symptomatic.  I will have her stop her Lisinopril for now.  Will try to resume this at a lower  dose at FU.  She plans to start cardiac rehab.    2. Stress Induced CM - EF 35-45% at [facility] with apical ballooning. EF by Echo post MI was normal.  She still has lateral TWI but these are overall improved when compared to the prior tracings.  Will repeat ECG at her next visit in 2-3 weeks.  If TWI persist will arrange FU echo.  3. CAD - She had diffuse 3 v CAD.  There were no targets for PCI at her cath.  Continue aggressive risk reduction with beta-blocker, statin, ASA, Plavix.  She has been resistant to taking cholesterol medications in the past.  Currently she is on Lipitor 20.  Will need to consider changing this to at least mod intensity in the future.   4. HTN - BP running low.  She is symptomatic.  Hold Lisinopril.  FU in 2-3 weeks.  If BP better, will resume Lisinopril at 2.5 mg QD.  I have asked her to push fluids for the next few days.  5. HL - Continue Lipitor.  FU Lipids and LFTs in 6-8 weeks.   6. Carotid artery disease - This is followed by PCP.    Medication Adjustments/Labs and Tests Ordered: Current medicines are reviewed at length with the patient today.  Concerns regarding medicines are outlined above.  Medication changes, Labs and Tests ordered today are outlined in the Patient Instructions noted below. Patient Instructions  Medication Instructions:  1. STOP LISINOPRIL Labwork: 6-8 WEEKS (07/06/16) FASTING CHOLESTEROL PANEL Testing/Procedures: NONE Follow-Up: [employer], [facility] 06/07/16 @ 12:15 Any Other Special Instructions Will Be Listed Below (If Applicable). PUSH FLUIDS OVER THE NEXT 2-3 DAYS (WATER, JUICE) If you need a refill on your cardiac medications before your next appointment, please call your pharmacy.    Signed, Richardson Dopp, PA-C  05/22/2016 5:15 PM    [facility] [place], [place], [place]  [address] Phone: [phone]; Fax: [phone]

## 2016-05-22 NOTE — Progress Notes (Signed)
Patient ID: [name]                 DOB: 1941/11/27                      MRN: [id number]    Pharmacy Transitions of Care Visit  HPI: [name] is a 75 y.o. female who was admitted from 6/11-6/13  with primary diagnosis of STEMI.  She was admitted on 6/11 and Code STEMI was called.  Cath showed diffuse disease but no definitive cause of STEMI.  She was diagnosed with stressed induced cardiomyopathy with medical management recommended. PMH is significant for CAD s/p BMS to LAD in 2008, HLD, and HTN. Patient presents to clinic for pharmacy transitions of care medication reconciliation after hospital discharge.  All medications have been reviewed with patient.  Issues/concerns noted are as follows: 1.  Hyperlipidemia- pt had Lipitor 20mg  daily PTA.  LDL was 99 mg/dL.  After talking with patient, she had not been taking any medications prior to hospitalization.  She has also not started Lipitor because she did not have an prescription.   2.  Hypotension- Pt's BP low today.  She is feeling fatigued since being home from the hospital.   Past Medical History  Diagnosis Date  . CAD (coronary artery disease)     a. BMS to LAD 2008, multivessel disease // b. LHC 6/17 (ant-lat STEMI): LM ok, pLAD 70, mLAD 50, dLAD 60, oD2 70, oLCx 60, pRCA 30, dRCA 50, EF 35-45% with apical ballooning - no culprit for MI and no PCI performed - prob TakoTsubo CM  . Hyperlipidemia   . Raynaud's syndrome   . Diverticulosis of colon (without mention of hemorrhage)   . Hemorrhage of gastrointestinal tract, unspecified   . 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 Urinary frequency   . Personal history of colonic polyps     Dr. Ardis Hughs, GI  . Hypothyroidism   . Arthritis   . Blood transfusion without reported diagnosis   . Ulcer   . History of non-ST elevation myocardial infarction (NSTEMI) 2008  . Bronchitis     hx of  . History of pneumonia   . Anemia   . Diverticulitis   .  Chronic back pain   . Essential hypertension     Dr. Bluford Kaufmann [phone] at [place]  . History of echocardiogram     a. Echo 05/14/16: Mild focal basal septal hypertrophy, EF 60-65%, normal wall motion, grade 2 diastolic dysfunction, mild TR  . Takotsubo cardiomyopathy     a. Admitted 6/17 with anterolateral STEMI >> no culprit on LHC and EF 35-45% with apical ballooning // Echo prior to discharge with EF 60-65% and normal wall motion   . Carotid artery disease ([place])     Carotid US [id number]: RICA 123456, LICA 123456 >> follow-up 1 year    Current Outpatient Prescriptions on File Prior to Visit  Medication Sig Dispense Refill  . aspirin EC 81 MG EC tablet Take 1 tablet (81 mg total) by mouth once. 30 tablet 11  . atorvastatin (LIPITOR) 20 MG tablet Take 1 tablet (20 mg total) by mouth daily. (Patient not taking: Reported on 05/13/2016) 90 tablet 3  . clopidogrel (PLAVIX) 75 MG tablet Take 1 tablet (75 mg total) by mouth daily. 30 tablet 11  . ibuprofen (ADVIL,MOTRIN) 200 MG tablet Take 400 mg by mouth daily as needed (arthritis pain).    Marland Kitchen  levothyroxine (SYNTHROID, LEVOTHROID) 125 MCG tablet Take 125 mcg by mouth daily before breakfast.    . lisinopril (PRINIVIL,ZESTRIL) 10 MG tablet Take 1 tablet (10 mg total) by mouth daily. 30 tablet 11  . metoprolol succinate (TOPROL-XL) 25 MG 24 hr tablet Take 1 tablet (25 mg total) by mouth daily. 30 tablet 6  . nitroGLYCERIN (NITROSTAT) 0.4 MG SL tablet Place 1 tablet (0.4 mg total) under the tongue every 5 (five) minutes as needed for chest pain. 25 tablet 3   No current facility-administered medications on file prior to visit.    No Known Allergies  Assessment/Plan:  1. Hyperlipidemia- Given her extent of CAD, would benefit from aggressive LDL control.  Will send in Rx for Lipitor 20mg .  This should get her to LDL goal.   2.  Hypotension- Pt's BP low today.  May consider decreasing meds to help with fatigue as well.  Will discuss with  PA.

## 2016-05-22 NOTE — Telephone Encounter (Signed)
I spoke with husband to let patient know she does not have the genotype for the Dal-Gene Study.I thanked her for her willingness to participate in the study. I told him to have patient call me if she has any questions.

## 2016-06-06 NOTE — Progress Notes (Signed)
Cardiology Office Note:    Date:  06/07/2016   ID:  [name], DOB [birth year], MRN [id number]  PCP:  Nyoka Cowden, MD  Cardiologist:  Dr. Sherren Mocha   Electrophysiologist:  n/a  Referring MD: Marletta Lor, MD   Chief Complaint  Patient presents with  . Follow-up    CAD, DCM, HTN    History of Present Illness:     [name] is a 75 y.o. female with a hx of CAD. She initially presented with non-STEMI in 2008 treated with BMS to the mid LAD. LHC in 2013 demonstrated patent stent in mid LAD and diffuse nonobstructive disease in the RCA and LCx. LV function was normal.    Admitted in 6/17 with anterolateral STEMI. Emergent LHC demonstrated patent mid LAD stent with 50% ISR, proximal LAD 70%, distal LAD 60%, ostial D2 70%, ostial LCx 30%, proximal RCA 30%, distal RCA 50%. She had EF of 35-45% with apical ballooning. There was no culprit for her MI and it was overall felt that her clinical picture represented stress-induced cardiomyopathy.  FU Echo demonstrated normal LV function with EF 60-65%. She did have some brief episodes of nonsustained ventricular tachycardia. Beta blocker was continued.  Last seen by me 05/22/16.  She has persistent TWI.  She returns for FU.  Here with her son.  She continues to note substernal discomfort described as tightness. This is constant. She has noted it since her MI.  It is no worse, no better.  She is fairly sedentary.  She notes dyspnea with mod activities.  This is unchanged.  She denies syncope or near syncope.  She denies orthopnea, PND, edema.  She thinks that lying flat may make her CP worse.  She has a NP cough. Denies wheezing.  She still smokes.    Past Medical History  Diagnosis Date  . CAD (coronary artery disease)     a. BMS to LAD 2008, multivessel disease // b. LHC 6/17 (ant-lat STEMI): LM ok, pLAD 70, mLAD 50, dLAD 60, oD2 70, oLCx 60, pRCA 30, dRCA 50, EF 35-45% with apical ballooning - no culprit for MI and no  PCI performed - prob TakoTsubo CM  . Hyperlipidemia   . Raynaud's syndrome   . Diverticulosis of colon (without mention of hemorrhage)   . Hemorrhage of gastrointestinal tract, unspecified   . 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 Urinary frequency   . Personal history of colonic polyps     Dr. Ardis Hughs, GI  . Hypothyroidism   . Arthritis   . Blood transfusion without reported diagnosis   . Ulcer   . History of non-ST elevation myocardial infarction (NSTEMI) 2008  . Bronchitis     hx of  . History of pneumonia   . Anemia   . Diverticulitis   . Chronic back pain   . Essential hypertension     Dr. Bluford Kaufmann [phone] at [facility]  . History of echocardiogram     a. Echo 05/14/16: Mild focal basal septal hypertrophy, EF 60-65%, normal wall motion, grade 2 diastolic dysfunction, mild TR  . Takotsubo cardiomyopathy     a. Admitted 6/17 with anterolateral STEMI >> no culprit on LHC and EF 35-45% with apical ballooning // Echo prior to discharge with EF 60-65% and normal wall motion   . Carotid artery disease ([place])     Carotid US [id number]: RICA 123456, LICA 123456 >> follow-up 1 year    Past Surgical  History  Procedure Laterality Date  . Gastrointestinal stomal tumor resection    . Cholecystectomy    . Tubal ligation    . Abdominal hysterectomy    . Appendectomy  1955  . Coronary angioplasty with stent placement      x1, 2007or 2008  . Abdominal hysterectomy  1975  . Partial colectomy  12/23/2012  . Laparoscopic partial colectomy  12/23/2012    Procedure: LAPAROSCOPIC PARTIAL COLECTOMY;  Surgeon: Stark Klein, MD;  Location: [facility];  Service: General;  Laterality: N/A;  . Cardiac catheterization N/A 05/13/2016    Procedure: Left Heart Cath and Coronary Angiography;  Surgeon: Wellington Hampshire, MD;  Location: [facility];  Service: Cardiovascular;  Laterality: N/A;    Current Medications: Outpatient Prescriptions Prior to Visit    Medication Sig Dispense Refill  . aspirin EC 81 MG EC tablet Take 1 tablet (81 mg total) by mouth once. 30 tablet 11  . atorvastatin (LIPITOR) 20 MG tablet Take 1 tablet (20 mg total) by mouth daily. 90 tablet 3  . clopidogrel (PLAVIX) 75 MG tablet Take 1 tablet (75 mg total) by mouth daily. 30 tablet 11  . ibuprofen (ADVIL,MOTRIN) 200 MG tablet Take 400 mg by mouth daily as needed (arthritis pain).    Marland Kitchen levothyroxine (SYNTHROID, LEVOTHROID) 125 MCG tablet Take 125 mcg by mouth daily before breakfast.    . metoprolol succinate (TOPROL-XL) 25 MG 24 hr tablet Take 1 tablet (25 mg total) by mouth daily. 30 tablet 6  . nitroGLYCERIN (NITROSTAT) 0.4 MG SL tablet Place 1 tablet (0.4 mg total) under the tongue every 5 (five) minutes as needed for chest pain. 25 tablet 3   No facility-administered medications prior to visit.      Allergies:   Review of patient's allergies indicates no known allergies.   Social History   Social History  . Marital Status: Married    Spouse Name: N/A  . Number of Children: N/A  . 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 Smoking status: Current Every Day Smoker -- 0.75 packs/day for 40 years    Types: Cigarettes  . Smokeless tobacco: Never Used  . Alcohol Use: No  . Drug Use: No  . Sexual Activity: Not Asked   Other Topics Concern  . None   Social History Narrative     Family History:  The patient's family history includes Cerebral aneurysm in her sister; Heart disease in her mother; Lung cancer in her father. There is no history of Colon cancer, Esophageal cancer, Stomach cancer, or Rectal cancer.   ROS:   Please see the history of present illness.    Review of Systems  Hematologic/Lymphatic: Bruises/bleeds easily.   All other systems reviewed and are negative.   Physical Exam:    VS:  BP 120/70 mmHg  Pulse 58  Ht 5\' 5"  (1.651 m)  Wt 106 lb 12.8 oz (48.444 kg)  BMI 17.77 kg/m2    Physical Exam  Constitutional: She is oriented to person, place, and time. She appears well-developed and well-nourished.  HENT:  Head: Normocephalic and atraumatic.  Neck: No JVD present.  Cardiovascular: Normal rate, regular rhythm and normal heart sounds.   No murmur heard. Pulmonary/Chest: She has no wheezes. She has no rales.  Decrease breath sounds bilaterally   Abdominal: Soft. Bowel sounds are normal. There is no tenderness.  Musculoskeletal: She exhibits no edema.  Neurological: She is alert  and oriented to person, place, and time.  Skin: Skin is warm and dry.  Psychiatric: She has a normal mood and affect.    Wt Readings from Last 3 Encounters:  06/07/16 106 lb 12.8 oz (48.444 kg)  05/22/16 106 lb 12.8 oz (48.444 kg)  05/15/16 104 lb 4.4 oz (47.3 kg)     Studies/Labs Reviewed:     EKG:  EKG is  ordered today.  The ekg ordered today demonstrates Sinus brady, HR 58, LAD, TWI 1, aVL, V3-6, QTc 439 ms, no change since last tracing.   Recent Labs: 02/21/2016: ALT 9 02/28/2016: TSH 0.83 05/14/2016: BUN 10; Creatinine, Ser 0.72; Hemoglobin 12.7; Magnesium 1.9; Platelets 163; Potassium 3.6; Sodium 139   Recent Lipid Panel    Component Value Date/Time   CHOL 174 05/13/2016 1339   TRIG 91 05/13/2016 1339   HDL 57 05/13/2016 1339   CHOLHDL 3.1 05/13/2016 1339   VLDL 18 05/13/2016 1339   LDLCALC 99 05/13/2016 1339   LDLDIRECT 181.6 08/26/2013 1041    Additional studies/ records that were reviewed today include:   LHC 05/13/16 LM normal LAD proximal 70%, mid 50% ISR, distal 60%, D2 ostial 70% LCx ostial 60% RCA proximal 30%, distal 50% EF 35-45%, apical ballooning consistent with takotsubo cardiomyopathy 1. Borderline significant three-vessel coronary artery disease with moderately to severely calcified coronary arteries. Patent stent in the mid LAD with moderate in-stent restenosis. I don't see a culprit for myocardial infarction. 2. Moderately reduced LV systolic  function with an estimated ejection fraction of 40% with akinesis of the apical segments with normal function in the basal areas. The stent with stress-induced cardiomyopathy. 3. Normal left ventricular end-diastolic pressure. Recommendations: The patient's presentation seems to be due to stress-induced cardiomyopathy. Given that the LAD does not wrap around the apex, the distal inferior wall akinesis cannot be explained by an LAD event.  Echo 05/14/16 - Left ventricle: The cavity size was normal. There was mild focal  basal hypertrophy of the septum. Systolic function was normal.  The estimated ejection fraction was in the range of 60% to 65%.  Wall motion was normal; there were no regional wall motion  abnormalities. Features are consistent with a pseudonormal left  ventricular filling pattern, with concomitant abnormal relaxation  and increased filling pressure (grade 2 diastolic dysfunction). - Aortic valve: Trileaflet; mildly thickened, mildly calcified  leaflets. - Tricuspid valve: There was mild regurgitation.  Carotid US 03/07/16 Progression of RICA stenosis, now in 40-59% range. Stable 123456 LICA stenosis. F/U 1 year.  ASSESSMENT:     1. Stress-induced cardiomyopathy   2. Coronary artery disease involving native coronary artery of native heart without angina pectoris   3. Essential hypertension   4. Dyslipidemia     PLAN:     In order of problems listed above:  1. Stress Induced CM - s/p STEMI last month thought to be stress induced MI.  There was no culprit on her LHC and medical Rx was recommended for her CAD.  EF 35-45% at [facility] with apical ballooning. EF by Echo post MI was normal. She still has lateral TWI c/w last tracing. When last seen, her BP was low and I stopped her ACE.  BP is better now.  She complains of increased bruising and would like to come off of Plavix.  Reviewed with Dr. Burt Knack who briefly saw the patient as well.    -  Continue beta blocker, ASA,  Plavix, Lipitor.  -  Start Lisinopril 2.5 mg QD -  BMET 1 week   -  Arrange FU limited echo to assess wall motion, EF and r/o pericardial effusion  -  Will decide on +/- DC Plavix based upon echo results  2. CAD - She had diffuse 3 v CAD on last LHC. There were no targets for PCI at her cath. Continue aggressive risk reduction with beta-blocker, statin, ASA, Plavix, Lipitor 20.  Start ACE inhibitor as noted above.     3. HTN - Controlled.   4. HL - Continue Lipitor. FU Lipids and LFTs in 07/2016.  If LDL > 70, consider Atorva 40 mg QD    Medication Adjustments/Labs and Tests Ordered: Current medicines are reviewed at length with the patient today.  Concerns regarding medicines are outlined above.  Medication changes, Labs and Tests ordered today are outlined in the Patient Instructions noted below. Patient Instructions  Medication Instructions:  1. START LISINOPRIL 2.5 MG DAILY; RX SENT 2. TRY OTC PEPCID 20 MG TWICE DAILY; TAKE THIS FOR 1-2 WEEKS; IF IT HELPS THEN FOLLOW UP WITH PCP; IF IT DOES NOT HELP THEN STOP Labwork: YOU WILL NEED BMET IN 1 WEEK Testing/Procedures: Your physician has requested that you have an echocardiogram. Echocardiography is a painless test that uses sound waves to create images of your heart. It provides your doctor with information about the size and shape of your heart and how well your heart's chambers and valves are working. This procedure takes approximately one hour. There are no restrictions for this procedure. Follow-Up: DR. Burt Knack IN 3 MONTHS  Any Other Special Instructions Will Be Listed Below (If Applicable). If you need a refill on your cardiac medications before your next appointment, please call your pharmacy.   Signed, Richardson Dopp, PA-C  06/07/2016 1:00 PM    [facility] [address], [place], [place]  [address] Phone: [phone]; Fax: [phone]

## 2016-06-07 ENCOUNTER — Encounter: Payer: Self-pay | Admitting: Physician Assistant

## 2016-06-07 ENCOUNTER — Ambulatory Visit (INDEPENDENT_AMBULATORY_CARE_PROVIDER_SITE_OTHER): Payer: Medicare Other | Admitting: Physician Assistant

## 2016-06-07 VITALS — BP 120/70 | HR 58 | Ht 65.0 in | Wt 106.8 lb

## 2016-06-07 DIAGNOSIS — I10 Essential (primary) hypertension: Secondary | ICD-10-CM

## 2016-06-07 DIAGNOSIS — I5181 Takotsubo syndrome: Secondary | ICD-10-CM

## 2016-06-07 DIAGNOSIS — I2510 Atherosclerotic heart disease of native coronary artery without angina pectoris: Secondary | ICD-10-CM | POA: Diagnosis not present

## 2016-06-07 DIAGNOSIS — E785 Hyperlipidemia, unspecified: Secondary | ICD-10-CM | POA: Diagnosis not present

## 2016-06-07 MED ORDER — LISINOPRIL 2.5 MG PO TABS: 2.5000 mg | ORAL_TABLET | Freq: Every day | ORAL | Status: DC

## 2016-06-07 NOTE — Patient Instructions (Addendum)
Medication Instructions:  1. START LISINOPRIL 2.5 MG DAILY; RX SENT 2. TRY OTC PEPCID 20 MG TWICE DAILY; TAKE THIS FOR 1-2 WEEKS; IF IT HELPS THEN FOLLOW UP WITH PCP; IF IT DOES NOT HELP THEN STOP Labwork: YOU WILL NEED BMET IN 1 WEEK Testing/Procedures: Your physician has requested that you have an echocardiogram. Echocardiography is a painless test that uses sound waves to create images of your heart. It provides your doctor with information about the size and shape of your heart and how well your heart's chambers and valves are working. This procedure takes approximately one hour. There are no restrictions for this procedure. Follow-Up: DR. Burt Knack IN 3 MONTHS  Any Other Special Instructions Will Be Listed Below (If Applicable). If you need a refill on your cardiac medications before your next appointment, please call your pharmacy.

## 2016-06-08 ENCOUNTER — Encounter (INDEPENDENT_AMBULATORY_CARE_PROVIDER_SITE_OTHER): Payer: Self-pay

## 2016-06-08 ENCOUNTER — Encounter: Payer: Self-pay | Admitting: Physician Assistant

## 2016-06-08 ENCOUNTER — Other Ambulatory Visit: Payer: Self-pay | Admitting: Physician Assistant

## 2016-06-08 ENCOUNTER — Ambulatory Visit (HOSPITAL_COMMUNITY): Payer: Medicare Other | Attending: Cardiovascular Disease

## 2016-06-08 ENCOUNTER — Other Ambulatory Visit: Payer: Self-pay

## 2016-06-08 ENCOUNTER — Telehealth: Payer: Self-pay | Admitting: *Deleted

## 2016-06-08 DIAGNOSIS — I2510 Atherosclerotic heart disease of native coronary artery without angina pectoris: Secondary | ICD-10-CM

## 2016-06-08 NOTE — Telephone Encounter (Signed)
Pt has been notified of limited echo results by phone with verbal understanding. I did advised pt that the PA is going to ask Dr. Burt Knack if she need to stay on Plavix, or can we d/c ASA or Plavix or will she need to stay on both. Advised I will let her know once I hear back from Dr. Burt Knack and Brynda Rim. PA. I advised pt to continue on current Tx plan for now. Pt said thank you.

## 2016-06-11 ENCOUNTER — Ambulatory Visit: Payer: Medicare Other | Admitting: Physician Assistant

## 2016-06-14 ENCOUNTER — Telehealth: Payer: Self-pay | Admitting: Cardiovascular Disease

## 2016-06-14 ENCOUNTER — Other Ambulatory Visit: Payer: Medicare Other

## 2016-06-14 NOTE — Telephone Encounter (Signed)
Pt states that she did not start Lisinopril as instructed but is going to pick it up today and start it. Pt wanted to know if labs needed to be moved.  Advised pt that we would move her labs out a week  Clarified with Richardson Dopp, PA-C that pt was getting these labs only because of Lisinopril and he said yes. Rescheduled labs for 7/20.

## 2016-06-14 NOTE — Telephone Encounter (Signed)
New message     Pt c/o medication issue:  1. Name of Medication: Lisinopril  2. How are you currently taking this medication (dosage and times per day)? 2.5 mg po daily  3. Are you having a reaction (difficulty breathing--STAT)? no  4. What is your medication issue? The pt wants to know if the medication will effect the lab values for today the pt wants to speak with a nurse

## 2016-06-20 DIAGNOSIS — H61393 Other acquired stenosis of external ear canal, bilateral: Secondary | ICD-10-CM | POA: Diagnosis not present

## 2016-06-20 DIAGNOSIS — H6123 Impacted cerumen, bilateral: Secondary | ICD-10-CM | POA: Diagnosis not present

## 2016-06-20 DIAGNOSIS — H903 Sensorineural hearing loss, bilateral: Secondary | ICD-10-CM | POA: Diagnosis not present

## 2016-06-21 ENCOUNTER — Other Ambulatory Visit: Payer: Medicare Other

## 2016-06-21 ENCOUNTER — Telehealth: Payer: Self-pay | Admitting: Physician Assistant

## 2016-06-21 NOTE — Telephone Encounter (Signed)
New message     Pt confused about her blood work. Please call.

## 2016-06-21 NOTE — Telephone Encounter (Signed)
S/w pt who had a question about the 2 different lab appts; today and 8/4. I explained today was bmet due to new start of Lisinopril; 8/4 is fasting cholesterol panel. Pt will come in 7/21 fo bmet since too late today. Pt thanked me for my help.

## 2016-06-22 ENCOUNTER — Other Ambulatory Visit (INDEPENDENT_AMBULATORY_CARE_PROVIDER_SITE_OTHER): Payer: Medicare Other | Admitting: *Deleted

## 2016-06-22 DIAGNOSIS — I2510 Atherosclerotic heart disease of native coronary artery without angina pectoris: Secondary | ICD-10-CM

## 2016-06-22 LAB — BASIC METABOLIC PANEL
BUN: 12 mg/dL (ref 7–25)
CHLORIDE: 103 mmol/L (ref 98–110)
CO2: 29 mmol/L (ref 20–31)
Calcium: 8.9 mg/dL (ref 8.6–10.4)
Creat: 0.88 mg/dL (ref 0.60–0.93)
Glucose, Bld: 113 mg/dL — ABNORMAL HIGH (ref 65–99)
POTASSIUM: 4.6 mmol/L (ref 3.5–5.3)
SODIUM: 138 mmol/L (ref 135–146)

## 2016-07-06 ENCOUNTER — Other Ambulatory Visit: Payer: Medicare Other

## 2016-07-16 ENCOUNTER — Ambulatory Visit (INDEPENDENT_AMBULATORY_CARE_PROVIDER_SITE_OTHER): Payer: Medicare Other | Admitting: Internal Medicine

## 2016-07-16 ENCOUNTER — Encounter: Payer: Self-pay | Admitting: Internal Medicine

## 2016-07-16 VITALS — BP 102/82 | HR 60 | Temp 97.8°F | Ht 65.0 in | Wt 104.0 lb

## 2016-07-16 DIAGNOSIS — I779 Disorder of arteries and arterioles, unspecified: Secondary | ICD-10-CM | POA: Diagnosis not present

## 2016-07-16 DIAGNOSIS — I10 Essential (primary) hypertension: Secondary | ICD-10-CM | POA: Diagnosis not present

## 2016-07-16 DIAGNOSIS — E038 Other specified hypothyroidism: Secondary | ICD-10-CM | POA: Diagnosis not present

## 2016-07-16 DIAGNOSIS — I5181 Takotsubo syndrome: Secondary | ICD-10-CM

## 2016-07-16 DIAGNOSIS — I2510 Atherosclerotic heart disease of native coronary artery without angina pectoris: Secondary | ICD-10-CM

## 2016-07-16 DIAGNOSIS — E538 Deficiency of other specified B group vitamins: Secondary | ICD-10-CM

## 2016-07-16 DIAGNOSIS — I739 Peripheral vascular disease, unspecified: Secondary | ICD-10-CM

## 2016-07-16 MED ORDER — VITAMIN B-12 1000 MCG PO TABS: 1000.0000 ug | ORAL_TABLET | Freq: Every day | ORAL | 4 refills | Status: DC

## 2016-07-16 MED ORDER — CYANOCOBALAMIN 1000 MCG/ML IJ SOLN: 1000.0000 ug | INTRAMUSCULAR | 1 refills | Status: DC

## 2016-07-16 MED ORDER — LEVOTHYROXINE SODIUM 125 MCG PO TABS: 125.0000 ug | ORAL_TABLET | Freq: Every day | ORAL | 6 refills | Status: DC

## 2016-07-16 MED ORDER — CYANOCOBALAMIN 1000 MCG/ML IJ SOLN
1000.0000 ug | Freq: Once | INTRAMUSCULAR | Status: AC
  Administered 2016-07-16: 1000 ug via INTRAMUSCULAR

## 2016-07-16 NOTE — Patient Instructions (Addendum)
Limit your sodium (Salt) intake  Vitamin B12 1 mg by by injection monthly  Take all medications as prescribed  Return in 3 months for follow-up

## 2016-07-16 NOTE — Progress Notes (Signed)
   Subjective:    Patient ID: [name], female    DOB: 10/04/1941, 75 y.o.   MRN: [id number]  HPI  Wt Readings from Last 3 Encounters:  07/16/16 104 lb (47.2 kg)  06/07/16 106 lb 12.8 oz (48.4 kg)  05/22/16 106 lb 12.8 oz (48.4 kg)    Review of Systems     Objective:   Physical Exam        Assessment & Plan:

## 2016-07-16 NOTE — Progress Notes (Signed)
Subjective:    Patient ID: [name], female    DOB: Jun 13, 1941, 75 y.o.   MRN: [id number]  HPI   75 year old patient  who has a history of hypothyroidism.  TSH has been greater than 100 ( and free T4   0!)  in 2015 with discontinuation of thyroid supplementation.  Recently she has had considerable situational stress. Her son has been involved in a motorcycle accident.  And she has been very inconsistent with taking her medications.  She states that she generally takes  Levothyroxine in the morning, but all other medications at bedtime  Wt Readings from Last 3 Encounters:  07/16/16 104 lb (47.2 kg)  06/07/16 106 lb 12.8 oz (48.4 kg)  05/22/16 106 lb 12.8 oz (48.4 kg)    Past Medical History:  Diagnosis Date  . Anemia   . Arthritis   . Blood transfusion without reported diagnosis   . Bronchitis    hx of  . CAD (coronary artery disease)    a. BMS to LAD 2008, multivessel disease // b. LHC 6/17 (ant-lat STEMI): LM ok, pLAD 70, mLAD 50, dLAD 60, oD2 70, oLCx 60, pRCA 30, dRCA 50, EF 35-45% with apical ballooning - no culprit for MI and no PCI performed - prob TakoTsubo CM  . Carotid artery disease ([place])    Carotid US [id number]: RICA 123456, LICA 123456 >> follow-up 1 year  . Chronic back pain   . Diverticulitis   . Diverticulosis of colon (without mention of hemorrhage)   . Essential hypertension    Dr. Bluford Kaufmann [phone] at [facility]  . Hemorrhage of gastrointestinal tract, unspecified   . History of echocardiogram    a. Echo 05/14/16: Mild focal basal septal hypertrophy, EF 60-65%, normal wall motion, grade 2 diastolic dysfunction, mild TR  . History of non-ST elevation myocardial infarction (NSTEMI) 2008  . History of pneumonia   . Hyperlipidemia   . Hypothyroidism   . 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 Personal history of colonic polyps    Dr. Ardis Hughs, GI  . Raynaud's syndrome   . Takotsubo cardiomyopathy    a. Admitted 6/17  with anterolateral STEMI >> no culprit on LHC and EF 35-45% with apical ballooning // b. Echo prior to discharge with EF 60-65% and normal wall motion //  c. Limited echo 7/17: EF 55-60%, no RWMA, no pericardial effusion  . Ulcer   . Urinary frequency      Social History   Social History  . Marital status: Married    Spouse name: N/A  . Number of children: N/A  . 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 Smoking status: Current Every Day Smoker    Packs/day: 0.75    Years: 40.00    Types: Cigarettes  . Smokeless tobacco: Never Used  . Alcohol use No  . Drug use: No  . Sexual activity: Not on file   Other Topics Concern  . Not on file   Social History Narrative  . No narrative on file    Past Surgical History:  Procedure Laterality Date  . ABDOMINAL HYSTERECTOMY    . ABDOMINAL HYSTERECTOMY  1975  . APPENDECTOMY  1955  . CARDIAC CATHETERIZATION N/A 05/13/2016   Procedure: Left Heart Cath and Coronary Angiography;  Surgeon: Wellington Hampshire, MD;  Location: [facility];  Service: Cardiovascular;  Laterality: N/A;  .  CHOLECYSTECTOMY    . CORONARY ANGIOPLASTY WITH STENT PLACEMENT     x1, 2007or 2008  . GASTROINTESTINAL STOMAL TUMOR RESECTION    . LAPAROSCOPIC PARTIAL COLECTOMY  12/23/2012   Procedure: LAPAROSCOPIC PARTIAL COLECTOMY;  Surgeon: Stark Klein, MD;  Location: [facility];  Service: General;  Laterality: N/A;  . PARTIAL COLECTOMY  12/23/2012  . TUBAL LIGATION      Family History  Problem Relation Age of Onset  . Heart disease Mother     Verdie Drown  . Lung cancer Father     SISTER, UNCLE  . Cerebral aneurysm Sister     UNCLE NEPHEW  . Colon cancer Neg Hx   . Esophageal cancer Neg Hx   . Stomach cancer Neg Hx   . Rectal cancer Neg Hx     No Known Allergies  Current Outpatient Prescriptions on File Prior to Visit  Medication Sig Dispense Refill  . aspirin EC 81 MG EC tablet Take 1 tablet (81  mg total) by mouth once. 30 tablet 11  . ibuprofen (ADVIL,MOTRIN) 200 MG tablet Take 400 mg by mouth daily as needed (arthritis pain).    Marland Kitchen lisinopril (PRINIVIL,ZESTRIL) 2.5 MG tablet Take 1 tablet (2.5 mg total) by mouth daily. 90 tablet 3  . metoprolol succinate (TOPROL-XL) 25 MG 24 hr tablet Take 1 tablet (25 mg total) by mouth daily. 30 tablet 6  . nitroGLYCERIN (NITROSTAT) 0.4 MG SL tablet Place 1 tablet (0.4 mg total) under the tongue every 5 (five) minutes as needed for chest pain. 25 tablet 3   No current facility-administered medications on file prior to visit.     BP 102/82 (BP Location: Left Arm, Patient Position: Sitting, Cuff Size: Normal)   Pulse 60   Temp 97.8 F (36.6 C) (Oral)   Ht 5\' 5"  (1.651 m)   Wt 104 lb (47.2 kg)   SpO2 95%   BMI 17.31 kg/m     Review of Systems  Neurological: Positive for weakness.  Psychiatric/Behavioral: The patient is nervous/anxious.        Objective:   Physical Exam  Constitutional: She is oriented to person, place, and time. She appears well-developed and well-nourished.  HENT:  Head: Normocephalic.  Right Ear: External ear normal.  Left Ear: External ear normal.  Mouth/Throat: Oropharynx is clear and moist.  Eyes: Conjunctivae and EOM are normal. Pupils are equal, round, and reactive to light.  Neck: Normal range of motion. Neck supple. No thyromegaly present.  Cardiovascular: Normal rate, regular rhythm, normal heart sounds and intact distal pulses.   Pulmonary/Chest: Effort normal and breath sounds normal.  Abdominal: Soft. Bowel sounds are normal. She exhibits no mass. There is no tenderness.  Musculoskeletal: Normal range of motion.  Lymphadenopathy:    She has no cervical adenopathy.  Neurological: She is alert and oriented to person, place, and time.  Reflexes remain fairly brisk  Skin: Skin is warm and dry. No rash noted.  Psychiatric: She has a normal mood and affect. Her behavior is normal.            Assessment & Plan:     Hypothyroidism.  We'll check a TSH.  Compliance with  medications.  Discussed  coronary artery disease.  Will resume statin therapy .  Essential hypertension.  Medications resumed .  B12 deficiency.  Patient has been on monthly B12 shots in the past but not recently.  She apparently was switched to oral B12 daily, which she has not adhered to.  She states that  she does better with monthly B12 injections.  This medication will be refilled   all medications updated  check TSH  .  Return in 3  months for follow-up  Nyoka Cowden, MD

## 2016-07-16 NOTE — Progress Notes (Signed)
Pre visit review using our clinic review tool, if applicable. No additional management support is needed unless otherwise documented below in the visit note. 

## 2016-07-17 LAB — VITAMIN B12: Vitamin B-12: >1500 pg/mL — ABNORMAL HIGH (ref 211–911)

## 2016-07-17 LAB — TSH: TSH: 0.48 u[IU]/mL (ref 0.35–4.50)

## 2016-09-27 ENCOUNTER — Encounter: Payer: Self-pay | Admitting: Cardiovascular Disease

## 2016-09-27 ENCOUNTER — Ambulatory Visit (INDEPENDENT_AMBULATORY_CARE_PROVIDER_SITE_OTHER): Payer: Medicare Other | Admitting: Cardiovascular Disease

## 2016-09-27 VITALS — BP 120/70 | HR 80 | Ht 65.0 in | Wt 99.2 lb

## 2016-09-27 DIAGNOSIS — R634 Abnormal weight loss: Secondary | ICD-10-CM

## 2016-09-27 DIAGNOSIS — I5181 Takotsubo syndrome: Secondary | ICD-10-CM | POA: Diagnosis not present

## 2016-09-27 DIAGNOSIS — I2510 Atherosclerotic heart disease of native coronary artery without angina pectoris: Secondary | ICD-10-CM | POA: Diagnosis not present

## 2016-09-27 DIAGNOSIS — R1084 Generalized abdominal pain: Secondary | ICD-10-CM

## 2016-09-27 LAB — BASIC METABOLIC PANEL
BUN: 13 mg/dL (ref 7–25)
CHLORIDE: 104 mmol/L (ref 98–110)
CO2: 28 mmol/L (ref 20–31)
CREATININE: 0.93 mg/dL (ref 0.60–0.93)
Calcium: 9.9 mg/dL (ref 8.6–10.4)
GLUCOSE: 76 mg/dL (ref 65–99)
POTASSIUM: 4.4 mmol/L (ref 3.5–5.3)
Sodium: 140 mmol/L (ref 135–146)

## 2016-09-27 NOTE — Patient Instructions (Addendum)
Medication Instructions:  Your physician has recommended you make the following change in your medication:  1. RESTART Metoprolol Succinate 25mg  take one tablet by mouth daily  Labwork: Your physician recommends that you have lab work today: BMP  Testing/Procedures: Non-Cardiac CT Angiography (CTA), is a special type of CT scan that uses a computer to produce multi-dimensional views of major blood vessels throughout the body. In CT angiography, a contrast material is injected through an IV to help visualize the blood vessels (CTA Adomen and Pelvis)  Follow-Up: Your physician wants you to follow-up in: 6 MONTHS with Dr Burt Knack.  You will receive a reminder letter in the mail two months in advance. If you don't receive a letter, please call our office to schedule the follow-up appointment.   Any Other Special Instructions Will Be Listed Below (If Applicable).     If you need a refill on your cardiac medications before your next appointment, please call your pharmacy.

## 2016-09-27 NOTE — Progress Notes (Signed)
Cardiology Office Note Date:  09/27/2016   ID:  [name], DOB 29-Sep-1941, MRN [id number]  PCP:  Nyoka Cowden, MD  Cardiologist:  Sherren Mocha, MD    Chief Complaint  Patient presents with  . Stress Induced Cardiomyopathy     History of Present Illness: [name] is a 75 y.o. female who presents for follow-up of CAD and stress cardiomyopathy. She initially presented with non-STEMI in 2008 treated with BMS to the mid LAD. LHC in 2013 demonstrated patent stent in mid LAD and diffuse nonobstructive disease in the RCA and LCx. LV function has previously been normal. She was then admitted in June 2017 with an anterolateral STEMI. Cardiac catheterization demonstrated diffuse moderate coronary artery disease but no evidence for a culprit vessel and she was felt to have stress-induced cardiomyopathy with apical ballooning. Follow-up echocardiography demonstrated normalization of her LV function.  The patient has not been doing well. She complains of marked fatigue. She complains of weight loss. She denies chest pain, shortness of breath, orthopnea, or PND. She continues to smoke cigarettes. She has no leg swelling. She complains of abdominal pain. She complains of "bloating" after eating. States she can only eats small amounts.   Past Medical History:  Diagnosis Date  . Anemia   . Arthritis   . Blood transfusion without reported diagnosis   . Bronchitis    hx of  . CAD (coronary artery disease)    a. BMS to LAD 2008, multivessel disease // b. LHC 6/17 (ant-lat STEMI): LM ok, pLAD 70, mLAD 50, dLAD 60, oD2 70, oLCx 60, pRCA 30, dRCA 50, EF 35-45% with apical ballooning - no culprit for MI and no PCI performed - prob TakoTsubo CM  . Carotid artery disease ([place])    Carotid US [id number]: RICA 123456, LICA 123456 >> follow-up 1 year  . Chronic back pain   . Diverticulitis   . Diverticulosis of colon (without mention of hemorrhage)   . Essential hypertension    Dr. Bluford Kaufmann [phone] at [place]  . Hemorrhage of gastrointestinal tract, unspecified   . History of echocardiogram    a. Echo 05/14/16: Mild focal basal septal hypertrophy, EF 60-65%, normal wall motion, grade 2 diastolic dysfunction, mild TR  . History of non-ST elevation myocardial infarction (NSTEMI) 2008  . History of pneumonia   . Hyperlipidemia   . Hypothyroidism   . 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 Personal history of colonic polyps    Dr. Ardis Hughs, GI  . Raynaud's syndrome   . Takotsubo cardiomyopathy    a. Admitted 6/17 with anterolateral STEMI >> no culprit on LHC and EF 35-45% with apical ballooning // b. Echo prior to discharge with EF 60-65% and normal wall motion //  c. Limited echo 7/17: EF 55-60%, no RWMA, no pericardial effusion  . Ulcer ([place])   . Urinary frequency     Past Surgical History:  Procedure Laterality Date  . ABDOMINAL HYSTERECTOMY    . ABDOMINAL HYSTERECTOMY  1975  . APPENDECTOMY  1955  . CARDIAC CATHETERIZATION N/A 05/13/2016   Procedure: Left Heart Cath and Coronary Angiography;  Surgeon: Wellington Hampshire, MD;  Location: [facility];  Service: Cardiovascular;  Laterality: N/A;  . CHOLECYSTECTOMY    . CORONARY ANGIOPLASTY WITH STENT PLACEMENT     x1, 2007or 2008  . GASTROINTESTINAL STOMAL TUMOR RESECTION    . LAPAROSCOPIC PARTIAL COLECTOMY  12/23/2012   Procedure: LAPAROSCOPIC PARTIAL COLECTOMY;  Surgeon: Stark Klein, MD;  Location: [facility];  Service: General;  Laterality: N/A;  . PARTIAL COLECTOMY  12/23/2012  . TUBAL LIGATION      Current Outpatient Prescriptions  Medication Sig Dispense Refill  . aspirin EC 81 MG EC tablet Take 1 tablet (81 mg total) by mouth once. 30 tablet 11  . ibuprofen (ADVIL,MOTRIN) 200 MG tablet Take 400 mg by mouth daily as needed (arthritis pain).    Marland Kitchen levothyroxine (SYNTHROID, LEVOTHROID) 125 MCG tablet Take 1 tablet (125 mcg total) by mouth daily before breakfast. 90  tablet 6  . metoprolol succinate (TOPROL-XL) 25 MG 24 hr tablet Take 1 tablet (25 mg total) by mouth daily. 30 tablet 6  . nitroGLYCERIN (NITROSTAT) 0.4 MG SL tablet Place 1 tablet (0.4 mg total) under the tongue every 5 (five) minutes as needed for chest pain. 25 tablet 3   No current facility-administered medications for this visit.     Allergies:   Review of patient's allergies indicates no known allergies.   Social History:  The patient  reports that she has been smoking Cigarettes.  She has a 30.00 pack-year smoking history. She has never used smokeless tobacco. She reports that she does not drink alcohol or use drugs.   Family History:  The patient's  family history includes Cerebral aneurysm in her sister; Heart disease in her mother; Lung cancer in her father.    ROS:  Please see the history of present illness.   All other systems are reviewed and negative.    PHYSICAL EXAM: VS:  BP 120/70   Pulse 80   Ht 5\' 5"  (1.651 m)   Wt 99 lb 3.2 oz (45 kg)   BMI 16.51 kg/m  , BMI Body mass index is 16.51 kg/m. GEN: Thin, pleasant woman, in no acute distress  HEENT: normal  Neck: no JVD, no masses. No carotid bruits Cardiac: RRR without murmur or gallop                Respiratory:  clear to auscultation bilaterally, normal work of breathing GI: soft, nontender, nondistended, + BS MS: no deformity or atrophy  Ext: no pretibial edema, pedal pulses 2+= bilaterally Skin: warm and dry, no rash Neuro:  Strength and sensation are intact Psych: euthymic mood, full affect  EKG:  EKG is not ordered today.  Recent Labs: 02/21/2016: ALT 9 05/14/2016: Hemoglobin 12.7; Magnesium 1.9; Platelets 163 06/22/2016: BUN 12; Creat 0.88; Potassium 4.6; Sodium 138 07/16/2016: TSH 0.48   Lipid Panel     Component Value Date/Time   CHOL 174 05/13/2016 1339   TRIG 91 05/13/2016 1339   HDL 57 05/13/2016 1339   CHOLHDL 3.1 05/13/2016 1339   VLDL 18 05/13/2016 1339   LDLCALC 99 05/13/2016 1339    LDLDIRECT 181.6 08/26/2013 1041      Wt Readings from Last 3 Encounters:  09/27/16 99 lb 3.2 oz (45 kg)  07/16/16 104 lb (47.2 kg)  06/07/16 106 lb 12.8 oz (48.4 kg)     Cardiac Studies Reviewed: Echo 06-22-2016: Study Conclusions  - Left ventricle: Abnormal septal motion The cavity size was   normal. Systolic function was normal. The estimated ejection   fraction was in the range of 55% to 60%. Wall motion was normal;   there were no regional wall motion abnormalities. Left   ventricular diastolic function parameters were normal. - Atrial septum: No defect or patent foramen ovale was identified.  ASSESSMENT AND PLAN: 1.  CAD, native vessel, without angina:  The patient should continue on aspirin 81 mg. She stopped all of her other medicines. I think it is best for her to resume metoprolol succinate if she is willing to do that. I will see her back in 6 months.  2. Stress-induced cardiomyopathy, without symptoms of active heart failure: The patient's LV function normalized on follow-up echocardiography. She will resume metoprolol succinate.  3. Tobacco abuse: Long-standing issue, counseling done, not ready to quit.  4. Weight loss/abdominal pain: Multiple risk factors for mesenteric ischemia/intestinal angina. Recommend a CTA of the abdomen/pelvis for evaluation. If this is negative she may need formal GI evaluation.  Current medicines are reviewed with the patient today.  The patient does not have concerns regarding medicines.  Labs/ tests ordered today include:   Orders Placed This Encounter  Procedures  . CT Angio Abd/Pel w/ and/or w/o  . Basic metabolic panel    Disposition:   FU 6 months  Signed, Sherren Mocha, MD  09/27/2016 3:16 PM    [facility] [address], [place], [place]  [address] Phone: [phone]; Fax: [phone]

## 2016-10-04 ENCOUNTER — Ambulatory Visit (INDEPENDENT_AMBULATORY_CARE_PROVIDER_SITE_OTHER)
Admission: RE | Admit: 2016-10-04 | Discharge: 2016-10-04 | Disposition: A | Discharge status: Home / Self Care | Payer: Medicare Other | Source: Ambulatory Visit | Attending: Cardiovascular Disease | Admitting: Cardiovascular Disease

## 2016-10-04 DIAGNOSIS — R1084 Generalized abdominal pain: Secondary | ICD-10-CM | POA: Diagnosis not present

## 2016-10-04 DIAGNOSIS — R634 Abnormal weight loss: Secondary | ICD-10-CM

## 2016-10-04 MED ORDER — IOPAMIDOL (ISOVUE-370) INJECTION 76%
100.0000 mL | Freq: Once | INTRAVENOUS | Status: AC | PRN
  Administered 2016-10-04: 100 mL via INTRAVENOUS

## 2016-10-05 ENCOUNTER — Ambulatory Visit (INDEPENDENT_AMBULATORY_CARE_PROVIDER_SITE_OTHER): Payer: Medicare Other | Admitting: *Deleted

## 2016-10-05 DIAGNOSIS — E538 Deficiency of other specified B group vitamins: Secondary | ICD-10-CM

## 2016-10-05 MED ORDER — CYANOCOBALAMIN 1000 MCG/ML IJ SOLN
1000.0000 ug | Freq: Once | INTRAMUSCULAR | Status: AC
  Administered 2016-10-05: 1000 ug via INTRAMUSCULAR

## 2016-11-08 ENCOUNTER — Ambulatory Visit (INDEPENDENT_AMBULATORY_CARE_PROVIDER_SITE_OTHER): Payer: Medicare Other | Admitting: Family Medicine

## 2016-11-08 DIAGNOSIS — E538 Deficiency of other specified B group vitamins: Secondary | ICD-10-CM | POA: Diagnosis not present

## 2016-11-08 MED ORDER — CYANOCOBALAMIN 1000 MCG/ML IJ SOLN
1000.0000 ug | Freq: Once | INTRAMUSCULAR | Status: AC
  Administered 2016-11-08: 1000 ug via INTRAMUSCULAR

## 2016-11-14 ENCOUNTER — Other Ambulatory Visit: Payer: Self-pay | Admitting: Acute Care

## 2016-11-14 DIAGNOSIS — F17210 Nicotine dependence, cigarettes, uncomplicated: Secondary | ICD-10-CM

## 2016-12-11 DIAGNOSIS — I7300 Raynaud's syndrome without gangrene: Secondary | ICD-10-CM | POA: Diagnosis not present

## 2016-12-11 DIAGNOSIS — L219 Seborrheic dermatitis, unspecified: Secondary | ICD-10-CM | POA: Diagnosis not present

## 2016-12-11 DIAGNOSIS — L821 Other seborrheic keratosis: Secondary | ICD-10-CM | POA: Diagnosis not present

## 2016-12-19 ENCOUNTER — Ambulatory Visit: Payer: Medicare Other | Admitting: Cardiovascular Disease

## 2017-01-07 ENCOUNTER — Ambulatory Visit (INDEPENDENT_AMBULATORY_CARE_PROVIDER_SITE_OTHER): Payer: Medicare Other | Admitting: Internal Medicine

## 2017-01-07 ENCOUNTER — Encounter: Payer: Self-pay | Admitting: Internal Medicine

## 2017-01-07 VITALS — BP 120/62 | HR 74 | Temp 98.2°F | Ht 65.0 in | Wt 100.2 lb

## 2017-01-07 DIAGNOSIS — E538 Deficiency of other specified B group vitamins: Secondary | ICD-10-CM

## 2017-01-07 DIAGNOSIS — E039 Hypothyroidism, unspecified: Secondary | ICD-10-CM

## 2017-01-07 DIAGNOSIS — I10 Essential (primary) hypertension: Secondary | ICD-10-CM | POA: Diagnosis not present

## 2017-01-07 DIAGNOSIS — M1612 Unilateral primary osteoarthritis, left hip: Secondary | ICD-10-CM | POA: Diagnosis not present

## 2017-01-07 DIAGNOSIS — R634 Abnormal weight loss: Secondary | ICD-10-CM | POA: Diagnosis not present

## 2017-01-07 DIAGNOSIS — E785 Hyperlipidemia, unspecified: Secondary | ICD-10-CM

## 2017-01-07 LAB — COMPREHENSIVE METABOLIC PANEL
ALK PHOS: 87 U/L (ref 39–117)
ALT: 10 U/L (ref 0–35)
AST: 14 U/L (ref 0–37)
Albumin: 4 g/dL (ref 3.5–5.2)
BILIRUBIN TOTAL: 0.4 mg/dL (ref 0.2–1.2)
BUN: 12 mg/dL (ref 6–23)
CALCIUM: 9.2 mg/dL (ref 8.4–10.5)
CO2: 30 mEq/L (ref 19–32)
Chloride: 106 mEq/L (ref 96–112)
Creatinine, Ser: 0.77 mg/dL (ref 0.40–1.20)
GFR: 77.77 mL/min (ref >60.00)
Glucose, Bld: 77 mg/dL (ref 70–99)
POTASSIUM: 4.4 meq/L (ref 3.5–5.1)
Sodium: 140 mEq/L (ref 135–145)
TOTAL PROTEIN: 6.1 g/dL (ref 6.0–8.3)

## 2017-01-07 LAB — CBC WITH DIFFERENTIAL/PLATELET
BASOS ABS: 0 10*3/uL (ref 0.0–0.1)
Basophils Relative: 0.3 % (ref 0.0–3.0)
EOS ABS: 0.1 10*3/uL (ref 0.0–0.7)
Eosinophils Relative: 1.1 % (ref 0.0–5.0)
HEMATOCRIT: 40.6 % (ref 36.0–46.0)
HEMOGLOBIN: 13.4 g/dL (ref 12.0–15.0)
LYMPHS PCT: 39.2 % (ref 12.0–46.0)
Lymphs Abs: 2 10*3/uL (ref 0.7–4.0)
MCHC: 32.9 g/dL (ref 30.0–36.0)
MCV: 96.1 fl (ref 78.0–100.0)
MONOS PCT: 8.4 % (ref 3.0–12.0)
Monocytes Absolute: 0.4 10*3/uL (ref 0.1–1.0)
Neutro Abs: 2.6 10*3/uL (ref 1.4–7.7)
Neutrophils Relative %: 51 % (ref 43.0–77.0)
PLATELETS: 172 10*3/uL (ref 150.0–400.0)
RBC: 4.23 Mil/uL (ref 3.87–5.11)
RDW: 13.7 % (ref 11.5–15.5)
WBC: 5.2 10*3/uL (ref 4.0–10.5)

## 2017-01-07 LAB — TSH: TSH: 0.03 u[IU]/mL — AB (ref 0.35–4.50)

## 2017-01-07 LAB — SEDIMENTATION RATE: SED RATE: 1 mm/h (ref 0–30)

## 2017-01-07 MED ORDER — CYANOCOBALAMIN 1000 MCG/ML IJ SOLN
1000.0000 ug | Freq: Once | INTRAMUSCULAR | Status: AC
  Administered 2017-01-07: 1000 ug via INTRAMUSCULAR

## 2017-01-07 NOTE — Progress Notes (Signed)
Subjective:    Patient ID: [name], female    DOB: 05-08-1941, 76 y.o.   MRN: [id number]  HPI 76 year old patient who is seen today in follow-up. She has a history of coronary artery disease and was hospitalized last year for ST segment elevated MI and possibly Takatsubo's syndrome. She is accompanied by her son today with persistent anorexia, weight loss.  She describes some generalized weakness, nonspecific anterior chest pain, and general sense of unwellness.  She states that she has been aching in a general fashion. She has been compliant with her medications.  She has hypothyroidism.  She has dyslipidemia  Past Medical History:  Diagnosis Date  . Anemia   . Arthritis   . Blood transfusion without reported diagnosis   . Bronchitis    hx of  . CAD (coronary artery disease)    a. BMS to LAD 2008, multivessel disease // b. LHC 6/17 (ant-lat STEMI): LM ok, pLAD 70, mLAD 50, dLAD 60, oD2 70, oLCx 60, pRCA 30, dRCA 50, EF 35-45% with apical ballooning - no culprit for MI and no PCI performed - prob TakoTsubo CM  . Carotid artery disease ([place])    Carotid US 4/09: RICA 81-19%, LICA 1-47% >> follow-up 1 year  . Chronic back pain   . Diverticulitis   . Diverticulosis of colon (without mention of hemorrhage)   . Essential hypertension    Dr. Bluford Kaufmann [phone] at [place]  . Hemorrhage of gastrointestinal tract, unspecified   . History of echocardiogram    a. Echo 05/14/16: Mild focal basal septal hypertrophy, EF 60-65%, normal wall motion, grade 2 diastolic dysfunction, mild TR  . History of non-ST elevation myocardial infarction (NSTEMI) 2008  . History of pneumonia   . Hyperlipidemia   . Hypothyroidism   . 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 Personal history of colonic polyps    Dr. Ardis Hughs, GI  . Raynaud's syndrome   . Takotsubo cardiomyopathy    a. Admitted 6/17 with anterolateral STEMI >> no culprit on LHC and EF 35-45%  with apical ballooning // b. Echo prior to discharge with EF 60-65% and normal wall motion //  c. Limited echo 7/17: EF 55-60%, no RWMA, no pericardial effusion  . Ulcer ([place])   . Urinary frequency      Social History   Social History  . Marital status: Married    Spouse name: N/A  . Number of children: N/A  . 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 Smoking status: Current Every Day Smoker    Packs/day: 0.75    Years: 40.00    Types: Cigarettes  . Smokeless tobacco: Never Used  . Alcohol use No  . Drug use: No  . Sexual activity: Not on file   Other Topics Concern  . Not on file   Social History Narrative  . No narrative on file    Past Surgical History:  Procedure Laterality Date  . ABDOMINAL HYSTERECTOMY    . ABDOMINAL HYSTERECTOMY  1975  . APPENDECTOMY  1955  . CARDIAC CATHETERIZATION N/A 05/13/2016   Procedure: Left Heart Cath and Coronary Angiography;  Surgeon: Wellington Hampshire, MD;  Location: [facility];  Service: Cardiovascular;  Laterality: N/A;  . CHOLECYSTECTOMY    . CORONARY ANGIOPLASTY WITH STENT PLACEMENT     x1, 2007or 2008  . GASTROINTESTINAL STOMAL TUMOR RESECTION    .  LAPAROSCOPIC PARTIAL COLECTOMY  12/23/2012   Procedure: LAPAROSCOPIC PARTIAL COLECTOMY;  Surgeon: Stark Klein, MD;  Location: [facility];  Service: General;  Laterality: N/A;  . PARTIAL COLECTOMY  12/23/2012  . TUBAL LIGATION      Family History  Problem Relation Age of Onset  . Heart disease Mother     Verdie Drown  . Lung cancer Father     SISTER, UNCLE  . Cerebral aneurysm Sister     UNCLE NEPHEW  . Colon cancer Neg Hx   . Esophageal cancer Neg Hx   . Stomach cancer Neg Hx   . Rectal cancer Neg Hx     No Known Allergies  Current Outpatient Prescriptions on File Prior to Visit  Medication Sig Dispense Refill  . aspirin EC 81 MG EC tablet Take 1 tablet (81 mg total) by mouth once. 30 tablet 11  .  CYANOCOBALAMIN IJ Inject 1,000 mcg as directed every 30 (thirty) days.    Marland Kitchen ibuprofen (ADVIL,MOTRIN) 200 MG tablet Take 400 mg by mouth daily as needed (arthritis pain).    Marland Kitchen levothyroxine (SYNTHROID, LEVOTHROID) 125 MCG tablet Take 1 tablet (125 mcg total) by mouth daily before breakfast. 90 tablet 6  . metoprolol succinate (TOPROL-XL) 25 MG 24 hr tablet Take 1 tablet (25 mg total) by mouth daily. 30 tablet 6  . nitroGLYCERIN (NITROSTAT) 0.4 MG SL tablet Place 1 tablet (0.4 mg total) under the tongue every 5 (five) minutes as needed for chest pain. 25 tablet 3   No current facility-administered medications on file prior to visit.     BP 120/62 (BP Location: Left Arm, Patient Position: Sitting, Cuff Size: Normal)   Pulse 74   Temp 98.2 F (36.8 C) (Oral)   Ht '5\' 5"'  (1.651 m)   Wt 100 lb 3.2 oz (45.5 kg)   SpO2 91%   BMI 16.67 kg/m       Review of Systems  Constitutional: Positive for activity change, fatigue and unexpected weight change.  HENT: Negative for congestion, dental problem, hearing loss, rhinorrhea, sinus pressure, sore throat and tinnitus.   Eyes: Negative for pain, discharge and visual disturbance.  Respiratory: Negative for cough and shortness of breath.   Cardiovascular: Positive for chest pain. Negative for palpitations and leg swelling.  Gastrointestinal: Negative for abdominal distention, abdominal pain, blood in stool, constipation, diarrhea, nausea and vomiting.  Genitourinary: Negative for difficulty urinating, dysuria, flank pain, frequency, hematuria, pelvic pain, urgency, vaginal bleeding, vaginal discharge and vaginal pain.  Musculoskeletal: Positive for arthralgias. Negative for gait problem and joint swelling.  Skin: Negative for rash.  Neurological: Positive for weakness. Negative for dizziness, syncope, speech difficulty, numbness and headaches.  Hematological: Negative for adenopathy.  Psychiatric/Behavioral: Negative for agitation, behavioral problems  and dysphoric mood. The patient is not nervous/anxious.        Objective:   Physical Exam  Constitutional: She is oriented to person, place, and time. She appears well-developed and well-nourished.  O2 saturation 94% Weak Frail Appears undernourished   HENT:  Head: Normocephalic.  Right Ear: External ear normal.  Left Ear: External ear normal.  Mouth/Throat: Oropharynx is clear and moist.  Eyes: Conjunctivae and EOM are normal. Pupils are equal, round, and reactive to light.  Neck: Normal range of motion. Neck supple. No thyromegaly present.  Cardiovascular: Normal rate, regular rhythm and normal heart sounds.   Dorsalis pedis pulses intact  Pulmonary/Chest: Effort normal and breath sounds normal. No respiratory distress. She has no wheezes. She has no rales.  Abdominal: Soft. Bowel sounds are normal. She exhibits no mass. There is no tenderness.  Musculoskeletal: Normal range of motion.  Osteoarthritic changes involving the small joints of the hands  Lymphadenopathy:    She has no cervical adenopathy.  Neurological: She is alert and oriented to person, place, and time.  Skin: Skin is warm and dry. No rash noted.  Psychiatric: She has a normal mood and affect. Her behavior is normal.          Assessment & Plan:   Weight loss.  We'll check screening lab including ESR Hypothyroidism Arthralgias Coronary artery disease Essential hypertension, stable B12 deficiency.  B12 injection, dispensed  Follow-up one month  Catriona Dillenbeck Pilar Plate

## 2017-01-07 NOTE — Patient Instructions (Signed)
Limit your sodium (Salt) intake  Call or return to clinic prn if these symptoms worsen or fail to improve as anticipated.  Return in one month for follow-up  Report any new or worsening symptoms

## 2017-01-07 NOTE — Progress Notes (Signed)
Pre visit review using our clinic review tool, if applicable. No additional management support is needed unless otherwise documented below in the visit note. 

## 2017-01-08 ENCOUNTER — Other Ambulatory Visit: Payer: Self-pay | Admitting: Internal Medicine

## 2017-01-08 MED ORDER — LEVOTHYROXINE SODIUM 100 MCG PO TABS: 100.0000 ug | ORAL_TABLET | Freq: Every day | ORAL | 2 refills | Status: DC

## 2017-01-31 DIAGNOSIS — H903 Sensorineural hearing loss, bilateral: Secondary | ICD-10-CM | POA: Diagnosis not present

## 2017-01-31 DIAGNOSIS — H61393 Other acquired stenosis of external ear canal, bilateral: Secondary | ICD-10-CM | POA: Diagnosis not present

## 2017-01-31 DIAGNOSIS — H6123 Impacted cerumen, bilateral: Secondary | ICD-10-CM | POA: Diagnosis not present

## 2017-02-12 ENCOUNTER — Ambulatory Visit (INDEPENDENT_AMBULATORY_CARE_PROVIDER_SITE_OTHER): Payer: Medicare Other | Admitting: Family Medicine

## 2017-02-12 ENCOUNTER — Encounter: Payer: Self-pay | Admitting: Family Medicine

## 2017-02-12 ENCOUNTER — Ambulatory Visit (INDEPENDENT_AMBULATORY_CARE_PROVIDER_SITE_OTHER)
Admission: RE | Admit: 2017-02-12 | Discharge: 2017-02-12 | Disposition: A | Discharge status: Home / Self Care | Payer: Medicare Other | Source: Ambulatory Visit | Attending: Family Medicine | Admitting: Family Medicine

## 2017-02-12 VITALS — BP 118/80 | HR 90 | Temp 98.2°F | Ht 65.0 in | Wt 100.5 lb

## 2017-02-12 DIAGNOSIS — R05 Cough: Secondary | ICD-10-CM | POA: Diagnosis not present

## 2017-02-12 DIAGNOSIS — J988 Other specified respiratory disorders: Secondary | ICD-10-CM | POA: Diagnosis not present

## 2017-02-12 DIAGNOSIS — Z720 Tobacco use: Secondary | ICD-10-CM

## 2017-02-12 DIAGNOSIS — R059 Cough, unspecified: Secondary | ICD-10-CM

## 2017-02-12 MED ORDER — BENZONATATE 100 MG PO CAPS: 100.0000 mg | ORAL_CAPSULE | Freq: Three times a day (TID) | ORAL | 0 refills | Status: DC | PRN

## 2017-02-12 MED ORDER — PREDNISONE 20 MG PO TABS: 40.0000 mg | ORAL_TABLET | Freq: Every day | ORAL | 0 refills | Status: DC

## 2017-02-12 NOTE — Progress Notes (Signed)
Pre visit review using our clinic review tool, if applicable. No additional management support is needed unless otherwise documented below in the visit note. 

## 2017-02-12 NOTE — Progress Notes (Signed)
HPI:  Cough: -started: 3-4 weeks ago according to the son -symptoms:pt reports clear nasal congestion, PND, cough -son feels he has heard her "rattle" and is worried about a "lung infection" and feels she needs an "inhaler" -denies:fever, SOB, wheezing, body aches, hemoptysis, NVD, tooth pain -has tried: nothing -sick contacts/travel/risks: no reported flu, strep or tick exposure -Hx of: denies hx allergies, COPD, asthma - but does have smoking hx and continues to smoke  ROS: See pertinent positives and negatives per HPI.  Past Medical History:  Diagnosis Date  . Anemia   . Arthritis   . Blood transfusion without reported diagnosis   . Bronchitis    hx of  . CAD (coronary artery disease)    a. BMS to LAD 2008, multivessel disease // b. LHC 6/17 (ant-lat STEMI): LM ok, pLAD 70, mLAD 50, dLAD 60, oD2 70, oLCx 60, pRCA 30, dRCA 50, EF 35-45% with apical ballooning - no culprit for MI and no PCI performed - prob TakoTsubo CM  . Carotid artery disease ([place])    Carotid US 5/05: RICA 39-76%, LICA 7-34% >> follow-up 1 year  . Chronic back pain   . Diverticulitis   . Diverticulosis of colon (without mention of hemorrhage)   . Essential hypertension    Dr. Bluford Kaufmann [phone] at [place]  . Hemorrhage of gastrointestinal tract, unspecified   . History of echocardiogram    a. Echo 05/14/16: Mild focal basal septal hypertrophy, EF 60-65%, normal wall motion, grade 2 diastolic dysfunction, mild TR  . History of non-ST elevation myocardial infarction (NSTEMI) 2008  . History of pneumonia   . Hyperlipidemia   . Hypothyroidism   . 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 Personal history of colonic polyps    Dr. Ardis Hughs, GI  . Raynaud's syndrome   . Takotsubo cardiomyopathy    a. Admitted 6/17 with anterolateral STEMI >> no culprit on LHC and EF 35-45% with apical ballooning // b. Echo prior to discharge with EF 60-65% and normal wall motion //   c. Limited echo 7/17: EF 55-60%, no RWMA, no pericardial effusion  . Ulcer ([place])   . Urinary frequency     Past Surgical History:  Procedure Laterality Date  . ABDOMINAL HYSTERECTOMY    . ABDOMINAL HYSTERECTOMY  1975  . APPENDECTOMY  1955  . CARDIAC CATHETERIZATION N/A 05/13/2016   Procedure: Left Heart Cath and Coronary Angiography;  Surgeon: Wellington Hampshire, MD;  Location: [facility];  Service: Cardiovascular;  Laterality: N/A;  . CHOLECYSTECTOMY    . CORONARY ANGIOPLASTY WITH STENT PLACEMENT     x1, 2007or 2008  . GASTROINTESTINAL STOMAL TUMOR RESECTION    . LAPAROSCOPIC PARTIAL COLECTOMY  12/23/2012   Procedure: LAPAROSCOPIC PARTIAL COLECTOMY;  Surgeon: Stark Klein, MD;  Location: [facility];  Service: General;  Laterality: N/A;  . PARTIAL COLECTOMY  12/23/2012  . TUBAL LIGATION      Family History  Problem Relation Age of Onset  . Heart disease Mother     Verdie Drown  . Lung cancer Father     SISTER, UNCLE  . Cerebral aneurysm Sister     UNCLE NEPHEW  . Colon cancer Neg Hx   . Esophageal cancer Neg Hx   . Stomach cancer Neg Hx   . Rectal cancer Neg Hx     Social History   Social History  . Marital status: Married    Spouse name: N/A  . Number of children:  N/A  . 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 Smoking status: Current Every Day Smoker    Packs/day: 0.75    Years: 40.00    Types: Cigarettes  . Smokeless tobacco: Never Used  . Alcohol use No  . Drug use: No  . Sexual activity: Not Asked   Other Topics Concern  . None   Social History Narrative  . None     Current Outpatient Prescriptions:  .  aspirin EC 81 MG EC tablet, Take 1 tablet (81 mg total) by mouth once., Disp: 30 tablet, Rfl: 11 .  CYANOCOBALAMIN IJ, Inject 1,000 mcg as directed every 30 (thirty) days., Disp: , Rfl:  .  ibuprofen (ADVIL,MOTRIN) 200 MG tablet, Take 400 mg by mouth daily as needed (arthritis pain)., Disp:  , Rfl:  .  levothyroxine (SYNTHROID, LEVOTHROID) 100 MCG tablet, Take 1 tablet (100 mcg total) by mouth daily before breakfast., Disp: 90 tablet, Rfl: 2 .  metoprolol succinate (TOPROL-XL) 25 MG 24 hr tablet, Take 1 tablet (25 mg total) by mouth daily., Disp: 30 tablet, Rfl: 6 .  nitroGLYCERIN (NITROSTAT) 0.4 MG SL tablet, Place 1 tablet (0.4 mg total) under the tongue every 5 (five) minutes as needed for chest pain., Disp: 25 tablet, Rfl: 3 .  benzonatate (TESSALON PERLES) 100 MG capsule, Take 1 capsule (100 mg total) by mouth 3 (three) times daily as needed for cough., Disp: 20 capsule, Rfl: 0 .  predniSONE (DELTASONE) 20 MG tablet, Take 2 tablets (40 mg total) by mouth daily with breakfast., Disp: 8 tablet, Rfl: 0  EXAM:  Vitals:   02/12/17 1407  BP: 118/80  Pulse: 90  Temp: 98.2 F (36.8 C)    Body mass index is 16.72 kg/m.  GENERAL: vitals reviewed and listed above, alert, oriented, appears well hydrated and in no acute distress  HEENT: atraumatic, conjunttiva clear, no obvious abnormalities on inspection of external nose and ears, normal appearance of ear canals and TMs, clear nasal congestion, mild post oropharyngeal erythema with PND, no tonsillar edema or exudate, no sinus TTP  NECK: no obvious masses on inspection  LUNGS: clear to auscultation bilaterally, no wheezes, rales or rhonchi, good air movement  CV: HRRR, no peripheral edema  MS: moves all extremities without noticeable abnormality  PSYCH: pleasant and cooperative, no obvious depression or anxiety  ASSESSMENT AND PLAN:  Discussed the following assessment and plan:  Respiratory infection  Cough - Plan: DG Chest 2 View  Tobacco use  -given HPI and exam findings today, a serious infection or illness is unlikely. We discussed potential etiologies, with viral resp infection, possible bronchitis being most likely. Will get CXR for son's concerns and duration symptoms. Prednisone and tessalon.We discussed  treatment side effects, likely course, antibiotic misuse, transmission, and signs of developing a serious illness. -of course, we advised to return or notify a doctor immediately if symptoms worsen or persist or new concerns arise.    Patient Instructions  BEFORE YOU LEAVE: -xray sheet  Go get the xray.  Take the prednisone as prescribed for bronchitis.  Take the cough medication as needed (tessalon.)  Please quit smoking.  I hope you are feeling better soon! Seek care immediately if worsening, new concerns or you are not improving with treatment.   WE NOW OFFER   [facility] FAST TRACK!!!  SAME DAY Appointments for ACUTE CARE  Such as: Sprains, Injuries, cuts, abrasions, rashes, muscle pain, joint  pain, back pain Colds, flu, sore throats, headache, allergies, cough, fever  Ear pain, sinus and eye infections Abdominal pain, nausea, vomiting, diarrhea, upset stomach Animal/insect bites  3 Easy Ways to Schedule: Walk-In Scheduling Call in scheduling Mychart Sign-up: [email or web address]            Colin Benton R., DO

## 2017-02-12 NOTE — Patient Instructions (Signed)
BEFORE YOU LEAVE: -xray sheet  Go get the xray.  Take the prednisone as prescribed for bronchitis.  Take the cough medication as needed (tessalon.)  Please quit smoking.  I hope you are feeling better soon! Seek care immediately if worsening, new concerns or you are not improving with treatment.   WE NOW OFFER   [facility] FAST TRACK!!!  SAME DAY Appointments for ACUTE CARE  Such as: Sprains, Injuries, cuts, abrasions, rashes, muscle pain, joint pain, back pain Colds, flu, sore throats, headache, allergies, cough, fever  Ear pain, sinus and eye infections Abdominal pain, nausea, vomiting, diarrhea, upset stomach Animal/insect bites  3 Easy Ways to Schedule: Walk-In Scheduling Call in scheduling Mychart Sign-up: [email or web address]

## 2017-03-21 ENCOUNTER — Other Ambulatory Visit: Payer: Self-pay | Admitting: Internal Medicine

## 2017-03-21 DIAGNOSIS — Z1231 Encounter for screening mammogram for malignant neoplasm of breast: Secondary | ICD-10-CM

## 2017-04-12 ENCOUNTER — Other Ambulatory Visit: Payer: Self-pay | Admitting: Internal Medicine

## 2017-04-12 DIAGNOSIS — I6523 Occlusion and stenosis of bilateral carotid arteries: Secondary | ICD-10-CM

## 2017-04-15 ENCOUNTER — Ambulatory Visit (HOSPITAL_COMMUNITY)
Admission: RE | Admit: 2017-04-15 | Discharge: 2017-04-15 | Disposition: A | Discharge status: Home / Self Care | Payer: Medicare Other | Source: Ambulatory Visit | Attending: Internal Medicine | Admitting: Internal Medicine

## 2017-04-15 DIAGNOSIS — I6523 Occlusion and stenosis of bilateral carotid arteries: Secondary | ICD-10-CM | POA: Diagnosis not present

## 2017-04-16 ENCOUNTER — Telehealth: Payer: Self-pay | Admitting: Internal Medicine

## 2017-04-16 NOTE — Telephone Encounter (Signed)
Error

## 2017-04-19 ENCOUNTER — Telehealth: Payer: Self-pay | Admitting: Cardiovascular Disease

## 2017-04-19 ENCOUNTER — Encounter: Payer: Self-pay | Admitting: *Deleted

## 2017-04-19 NOTE — Telephone Encounter (Signed)
Returned call to patient-advised to call PCP (ordering physician) for results and recommendations. Patient verbalized understanding.

## 2017-04-19 NOTE — Telephone Encounter (Signed)
ollow Up:   Pt would like her Doppler results from 04-15-17 please.

## 2017-04-25 DIAGNOSIS — H6123 Impacted cerumen, bilateral: Secondary | ICD-10-CM | POA: Diagnosis not present

## 2017-04-25 DIAGNOSIS — H903 Sensorineural hearing loss, bilateral: Secondary | ICD-10-CM | POA: Diagnosis not present

## 2017-04-25 DIAGNOSIS — H61393 Other acquired stenosis of external ear canal, bilateral: Secondary | ICD-10-CM | POA: Diagnosis not present

## 2017-05-02 ENCOUNTER — Ambulatory Visit
Admission: RE | Admit: 2017-05-02 | Discharge: 2017-05-02 | Disposition: A | Discharge status: Home / Self Care | Payer: Medicare Other | Source: Ambulatory Visit | Attending: Internal Medicine | Admitting: Internal Medicine

## 2017-05-02 DIAGNOSIS — Z1231 Encounter for screening mammogram for malignant neoplasm of breast: Secondary | ICD-10-CM

## 2017-05-03 ENCOUNTER — Ambulatory Visit (INDEPENDENT_AMBULATORY_CARE_PROVIDER_SITE_OTHER): Payer: Medicare Other

## 2017-05-03 VITALS — BP 130/80 | HR 73 | Ht 65.0 in | Wt 100.1 lb

## 2017-05-03 DIAGNOSIS — E2839 Other primary ovarian failure: Secondary | ICD-10-CM

## 2017-05-03 DIAGNOSIS — Z0000 Encounter for general adult medical examination without abnormal findings: Secondary | ICD-10-CM

## 2017-05-03 NOTE — Progress Notes (Addendum)
Subjective:   [name] is a 76 y.o. female who presents for Medicare Annual (Subsequent) preventive examination.  The Patient was informed that the wellness visit is to identify future health risk and educate and initiate measures that can reduce risk for increased disease through the lifespan.    NO ROS; Medicare Wellness Visit   Describes health as good, fair or great? [place] she started losing weight x 76 yo Lives with spouse;  Spouse is in fair health, diabetic 2 boys One lives with them; divorced The other lives in [place]: had quit taking thyroid; now taking 160mcg Noted Per Dr Raliegh Ip note to fup on 30 days in Feb; apt made   Screening test up to date or reviewed for plan of completion Health Maintenance Due  Topic Date Due  . TETANUS/TDAP  07/02/1961  . DEXA SCAN  07/03/2007   Declines Tdap -  Declines all vaccines  Preventive Health Risk Screened Mammogram completed 05/02/2017 Colonoscopy 12/2015;  Due 12/2020  Dexa no report  -  Report converted 12/2010  / completed 2002 prior to 66 -Shows bone loss of -1.2  Discussed and agreed to schedule at [facility]; will schedule apt with elam and Dr. Raliegh Ip on the same day Tdap due and education provided but declined     Smoking history - current smoker  .76 x 40 years with 30 pack years  LDCT recommended if appropriate: - in lung cancer screening program  Smokeless tobacco no  ETOH - no Educated on moderation if above norms   Medication adherence or issues? Does not like to take medicine.  States she is in pain every day but will not take her ibuprofen  Diet: persistent anorexia per MD note Depends on if she cooks;  Breakfast; none; gets up at 9 to 12noon At lunch; hamburgers, hot dogs or grilled cheese;  Supper; chicken, roast, does not eat a lot of pork, pasta and salads She can't tell me the last time she weighed 110.  Eats  A lot of fruit   Weight loss;  Has a lot of back pain; issues with neck 1-10  pain is an 8 on a daily basis; doesn't take a lot of meds  Doesn't c/o; states she likes to work    Exercise  Take care of the home [employer]; does Therapist, nutritional; cooks meals Works in the yard  Advanced aged  Adrian in women Hyperlipidemia - chol 174; HDL 57 and LDL 99 trig 91 Diabetes -neg Family History of HD Underweight for several years;   Medicare Screens  Will try to complete AD; Given copy  Referred to [facility] for questions [place] offers free advance directive forms, as well as assistance in completing the forms themselves. For assistance, contact the Spiritual Care Department at [phone], or the Clinical Social Work Department at [phone].    Hearing Screening Comments: Just had a hearing test; Has a very small ear canal; which needs opened up Slowly losing hearing in left ear Dr. Ronette Deter  Dtr in law may be able to get her a hearing aid if needed  Vision Screening Comments: Vision checks q 2 years Goes to dr. Wynetta Fines;  No issues  Vision is "average" still passes drivers test   Has had one fall; hurt her right arm and right cheek On the cough and woke up to go to bed   Has stopped doing things she did do, but spouse does not feel as good She does  not like to go to dinner   . MMSE - Mini Mental State Exam 05/03/2017  Orientation to time 5  Orientation to Place 5  Registration 3  Attention/ Calculation 3  Recall 3  Language- name 2 objects 2  Language- repeat 1  Language- follow 3 step command 3  Language- read & follow direction 1  Write a sentence 1  Copy design 1  Total score 28   Missed 2 calculations very articulate; no issues identified overall    Patient Care Team: Marletta Lor, MD as PCP - Gildardo Griffes, MD as Referring Physician (Dermatology) Sherren Mocha, MD as Consulting Physician (Cardiology) Cardiac Risk Factors include: advanced age (>64men, >27 women);hypertension;family history of premature  cardiovascular disease     Objective:     Vitals: BP 130/80   Pulse 73   Ht 5\' 5"  (1.651 m)   Wt 100 lb 2 oz (45.4 kg)   SpO2 (!) 83%   BMI 16.66 kg/m   Body mass index is 16.66 kg/m.   Tobacco History  Smoking Status  . Current Every Day Smoker  . Packs/day: 0.75  . Years: 40.00  . Types: Cigarettes  Smokeless Tobacco  . Never Used     Ready to quit: No Counseling given: Yes   Past Medical History:  Diagnosis Date  . Anemia   . Arthritis   . Blood transfusion without reported diagnosis   . Bronchitis    hx of  . CAD (coronary artery disease)    a. BMS to LAD 2008, multivessel disease // b. LHC 6/17 (ant-lat STEMI): LM ok, pLAD 70, mLAD 50, dLAD 60, oD2 70, oLCx 60, pRCA 30, dRCA 50, EF 35-45% with apical ballooning - no culprit for MI and no PCI performed - prob TakoTsubo CM  . Carotid artery disease ([place])    Carotid US 4/03: RICA 47-42%, LICA 5-95% >> follow-up 1 year  . Chronic back pain   . Diverticulitis   . Diverticulosis of colon (without mention of hemorrhage)   . Essential hypertension    Dr. Bluford Kaufmann [phone] at [place]  . Hemorrhage of gastrointestinal tract, unspecified   . History of echocardiogram    a. Echo 05/14/16: Mild focal basal septal hypertrophy, EF 60-65%, normal wall motion, grade 2 diastolic dysfunction, mild TR  . History of non-ST elevation myocardial infarction (NSTEMI) 2008  . History of pneumonia   . Hyperlipidemia   . Hypothyroidism   . 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 Personal history of colonic polyps    Dr. Ardis Hughs, GI  . Raynaud's syndrome   . Takotsubo cardiomyopathy    a. Admitted 6/17 with anterolateral STEMI >> no culprit on LHC and EF 35-45% with apical ballooning // b. Echo prior to discharge with EF 60-65% and normal wall motion //  c. Limited echo 7/17: EF 55-60%, no RWMA, no pericardial effusion  . Ulcer   . Urinary frequency    Past Surgical History:    Procedure Laterality Date  . ABDOMINAL HYSTERECTOMY    . ABDOMINAL HYSTERECTOMY  1975  . APPENDECTOMY  1955  . BREAST EXCISIONAL BIOPSY Right 1969   no visible scar  . CARDIAC CATHETERIZATION N/A 05/13/2016   Procedure: Left Heart Cath and Coronary Angiography;  Surgeon: Wellington Hampshire, MD;  Location: [place] [facility];  Service: Cardiovascular;  Laterality: N/A;  . CHOLECYSTECTOMY    . CORONARY ANGIOPLASTY WITH STENT PLACEMENT     x1,  2007or 2008  . GASTROINTESTINAL STOMAL TUMOR RESECTION    . LAPAROSCOPIC PARTIAL COLECTOMY  12/23/2012   Procedure: LAPAROSCOPIC PARTIAL COLECTOMY;  Surgeon: Stark Klein, MD;  Location: [place] [facility];  Service: General;  Laterality: N/A;  . PARTIAL COLECTOMY  12/23/2012  . TUBAL LIGATION     Family History  Problem Relation Age of Onset  . Heart disease Mother        Verdie Drown  . Breast cancer Mother   . Lung cancer Father        SISTER, UNCLE  . Cerebral aneurysm Sister        UNCLE NEPHEW  . Colon cancer Neg Hx   . Esophageal cancer Neg Hx   . Stomach cancer Neg Hx   . Rectal cancer Neg Hx    History  Sexual Activity  . Sexual activity: Not on file    Outpatient Encounter Prescriptions as of 05/03/2017  Medication Sig  . aspirin EC 81 MG EC tablet Take 1 tablet (81 mg total) by mouth once.  . benzonatate (TESSALON PERLES) 100 MG capsule Take 1 capsule (100 mg total) by mouth 3 (three) times daily as needed for cough.  . CYANOCOBALAMIN IJ Inject 1,000 mcg as directed every 30 (thirty) days.  Marland Kitchen ibuprofen (ADVIL,MOTRIN) 200 MG tablet Take 400 mg by mouth daily as needed (arthritis pain).  Marland Kitchen levothyroxine (SYNTHROID, LEVOTHROID) 100 MCG tablet Take 1 tablet (100 mcg total) by mouth daily before breakfast.  . metoprolol succinate (TOPROL-XL) 25 MG 24 hr tablet Take 1 tablet (25 mg total) by mouth daily.  . nitroGLYCERIN (NITROSTAT) 0.4 MG SL tablet Place 1 tablet (0.4 mg total) under the tongue every 5 (five) minutes as needed for chest pain.   . predniSONE (DELTASONE) 20 MG tablet Take 2 tablets (40 mg total) by mouth daily with breakfast. (Patient not taking: Reported on 05/03/2017)   No facility-administered encounter medications on file as of 05/03/2017.     Activities of Daily Living In your present state of health, do you have any difficulty performing the following activities: 05/03/2017 05/13/2016  Hearing? Tempie Donning  Vision? N N  Difficulty concentrating or making decisions? N Y  Walking or climbing stairs? N N  Dressing or bathing? N N  Doing errands, shopping? N N  Preparing Food and eating ? N -  Using the Toilet? N -  In the past six months, have you accidently leaked urine? Y -  Do you have problems with loss of bowel control? N -  Managing your Medications? N -  Managing your Finances? N -  Housekeeping or managing your Housekeeping? N -  Some recent data might be hidden    Patient Care Team: Marletta Lor, MD as PCP - General Weston Settle, MD as Referring Physician (Dermatology) Sherren Mocha, MD as Consulting Physician (Cardiology)    Assessment:    Education Coached smoking cessation. Not ready to quit  Educated regarding dexa screen and risk; agreed to repeat Educated regarding tdap ; declined  Exercise Activities and Dietary recommendations Current Exercise Habits: Home exercise routine, Type of exercise: Other - see comments (house work and yard work ), Time (Minutes): 60, Frequency (Times/Week): 4, Weekly Exercise (Minutes/Week): 240, Intensity: Mild  Goals    . Exercise 150 minutes per week (moderate activity)          Will start walking before lunch;  Will walk 3 days and build up to 30 minutes       Fall Risk Fall Risk  05/03/2017 03/29/2016 04/14/2015  Falls in the past year? Yes Yes No  Number falls in past yr: 1 1 -  Injury with Fall? - Yes -  Risk for fall due to : - Impaired balance/gait -  Follow up Education provided - -   Depression Screen PHQ 2/9 Scores 05/03/2017  03/29/2016 04/14/2015  PHQ - 2 Score 0 0 0     Cognitive Function MMSE - Mini Mental State Exam 05/03/2017  Orientation to time 5  Orientation to Place 5  Registration 3  Attention/ Calculation 3  Recall 3  Language- name 2 objects 2  Language- repeat 1  Language- follow 3 step command 3  Language- read & follow direction 1  Write a sentence 1  Copy design 1  Total score 28         There is no immunization history on file for this patient. Screening Tests Health Maintenance  Topic Date Due  . TETANUS/TDAP  07/02/1961  . DEXA SCAN  07/03/2007  . INFLUENZA VACCINE  01/07/2018 (Originally 07/03/2017)  . PNA vac Low Risk Adult (1 of 2 - PCV13) 01/07/2018 (Originally 07/03/2007)  . MAMMOGRAM  05/03/2019  . COLONOSCOPY  12/20/2020      Plan:      PCP Notes  Health Maintenance dexa 2002; osteopenia; [facility] to [facility] add'l fup  Will make apt with Dr. Raliegh Ip to fup   Screens  MMSE 28.30  Depression no issues Pain; states she has pain but does not like to take her ibuprofen   Referrals none (states son and dtr in law can assist with hearing aid if she needs one )   Patient concerns; none;   Nurse Concerns; BMI 16;  Denies depression; memory tested wnl; works in the yard, states she is eating fairly well except for breakfast. Affect normal;  Encouraged to eat whatever she wanted anytime she wanted it.  Next PCP apt: the patient states she will schedule fup with Dr. Raliegh Ip and have dexa completed on the same day.  (Dr. Raliegh Ip note stated in Feb to fup in one month)     I have personally reviewed and noted the following in the patient's chart:   . Medical and social history . Use of alcohol, tobacco or illicit drugs  . Current medications and supplements . Functional ability and status . Nutritional status . Physical activity . Advanced directives . List of other physicians . Hospitalizations, surgeries, and ER visits in previous 12 months . Vitals . Screenings to  include cognitive, depression, and falls . Referrals and appointments  In addition, I have reviewed and discussed with patient certain preventive protocols, quality metrics, and best practice recommendations. A written personalized care plan for preventive services as well as general preventive health recommendations were provided to patient.     Wynetta Fines, RN  05/03/2017  Results of Medicare wellness visit  reviewed and agree with findings  Nyoka Cowden

## 2017-05-03 NOTE — Patient Instructions (Addendum)
[name] , Thank you for taking time to come for your Medicare Wellness Visit. I appreciate your ongoing commitment to your health goals. Please review the following plan we discussed and let me know if I can assist you in the future.   [name] will order DEXA at the [facility] center;  They will call to set up an apt  You can scheduled your dexa and then make apt with dr. Raliegh Ip.  Will address weight, thyroid; pain and urine urgency; difficulty holding bladder   Osteopenia  Calcium 1210m with Vit D 800u per day; more as directed by physician Strength building exercises discussed; can include walking; housework; small weights or stretch bands; silver sneakers if access to the [facility]    You need your Tdap  A Tetanus is recommended every 10 years. Medicare covers a tetanus if you have a cut or wound; otherwise, there may be a charge. If you had not had a tetanus with pertusses, known as the Tdap, you can take this anytime.    [facility] - can assist with hearing aid x 1  No reviews  [facility]  [address]  [phone]  These are the goals we discussed: Goals    . Exercise 150 minutes per week (moderate activity)          Will start walking before lunch;  Will walk 3 days and build up to 30 minutes        This is a list of the screening recommended for you and due dates:  Health Maintenance  Topic Date Due  . Tetanus Vaccine  [birth year]/1961  . DEXA scan (bone density measurement)  07/03/2007  . Flu Shot  01/07/2018*  . Pneumonia vaccines (1 of 2 - PCV13) 01/07/2018*  . Mammogram  05/03/2019  . Colon Cancer Screening  12/20/2020  *Topic was postponed. The date shown is not the original due date.    '   Bone Densitometry Bone densitometry is an imaging test that uses a special X-ray to measure the amount of calcium and other minerals in your bones (bone density). This test is also known as a bone mineral density test or dual-energy  X-ray absorptiometry (DXA). The test can measure bone density at your hip and your spine. It is similar to having a regular X-ray. You may have this test to:  Diagnose a condition that causes weak or thin bones (osteoporosis).  Predict your risk of a broken bone (fracture).  Determine how well osteoporosis treatment is working.  Tell a health care provider about:  Any allergies you have.  All medicines you are taking, including vitamins, herbs, eye drops, creams, and over-the-counter medicines.  Any problems you or family members have had with anesthetic medicines.  Any blood disorders you have.  Any surgeries you have had.  Any medical conditions you have.  Possibility of pregnancy.  Any other medical test you had within the previous 14 days that used contrast material. What are the risks? Generally, this is a safe procedure. However, problems can occur and may include the following:  This test exposes you to a very small amount of radiation.  The risks of radiation exposure may be greater to unborn children.  What happens before the procedure?  Do not take any calcium supplements for 24 hours before having the test. You can otherwise eat and drink what you usually do.  Take off all metal jewelry, eyeglasses, dental appliances, and  any other metal objects. What happens during the procedure?  You may lie on an exam table. There will be an X-ray generator below you and an imaging device above you.  Other devices, such as boxes or braces, may be used to position your body properly for the scan.  You will need to lie still while the machine slowly scans your body.  The images will show up on a computer monitor. What happens after the procedure? You may need more testing at a later time. This information is not intended to replace advice given to you by your health care provider. Make sure you discuss any questions you have with your health care provider. Document  Released: 12/11/2004 Document Revised: 04/26/2016 Document Reviewed: 04/29/2014 Elsevier Interactive Patient Education  2018 [place] in the Home Falls can cause injuries. They can happen to people of all ages. There are many things you can do to make your home safe and to help prevent falls. What can I do on the outside of my home?  Regularly fix the edges of walkways and driveways and fix any cracks.  Remove anything that might make you trip as you walk through a door, such as a raised step or threshold.  Trim any bushes or trees on the path to your home.  Use bright outdoor lighting.  Clear any walking paths of anything that might make someone trip, such as rocks or tools.  Regularly check to see if handrails are loose or broken. Make sure that both sides of any steps have handrails.  Any raised decks and porches should have guardrails on the edges.  Have any leaves, snow, or ice cleared regularly.  Use sand or salt on walking paths during winter.  Clean up any spills in your garage right away. This includes oil or grease spills. What can I do in the bathroom?  Use night lights.  Install grab bars by the toilet and in the tub and shower. Do not use towel bars as grab bars.  Use non-skid mats or decals in the tub or shower.  If you need to sit down in the shower, use a plastic, non-slip stool.  Keep the floor dry. Clean up any water that spills on the floor as soon as it happens.  Remove soap buildup in the tub or shower regularly.  Attach bath mats securely with double-sided non-slip rug tape.  Do not have throw rugs and other things on the floor that can make you trip. What can I do in the bedroom?  Use night lights.  Make sure that you have a light by your bed that is easy to reach.  Do not use any sheets or blankets that are too big for your bed. They should not hang down onto the floor.  Have a firm chair that has side arms. You can  use this for support while you get dressed.  Do not have throw rugs and other things on the floor that can make you trip. What can I do in the kitchen?  Clean up any spills right away.  Avoid walking on wet floors.  Keep items that you use a lot in easy-to-reach places.  If you need to reach something above you, use a strong step stool that has a grab bar.  Keep electrical cords out of the way.  Do not use floor polish or wax that makes floors slippery. If you must use wax, use non-skid floor wax.  Do not  have throw rugs and other things on the floor that can make you trip. What can I do with my stairs?  Do not leave any items on the stairs.  Make sure that there are handrails on both sides of the stairs and use them. Fix handrails that are broken or loose. Make sure that handrails are as long as the stairways.  Check any carpeting to make sure that it is firmly attached to the stairs. Fix any carpet that is loose or worn.  Avoid having throw rugs at the top or bottom of the stairs. If you do have throw rugs, attach them to the floor with carpet tape.  Make sure that you have a light switch at the top of the stairs and the bottom of the stairs. If you do not have them, ask someone to add them for you. What else can I do to help prevent falls?  Wear shoes that: ? Do not have high heels. ? Have rubber bottoms. ? Are comfortable and fit you well. ? Are closed at the toe. Do not wear sandals.  If you use a stepladder: ? Make sure that it is fully opened. Do not climb a closed stepladder. ? Make sure that both sides of the stepladder are locked into place. ? Ask someone to hold it for you, if possible.  Clearly mark and make sure that you can see: ? Any grab bars or handrails. ? First and last steps. ? Where the edge of each step is.  Use tools that help you move around (mobility aids) if they are needed. These include: ? Canes. ? Walkers. ? Scooters. ? Crutches.  Turn  on the lights when you go into a dark area. Replace any light bulbs as soon as they burn out.  Set up your furniture so you have a clear path. Avoid moving your furniture around.  If any of your floors are uneven, fix them.  If there are any pets around you, be aware of where they are.  Review your medicines with your doctor. Some medicines can make you feel dizzy. This can increase your chance of falling. Ask your doctor what other things that you can do to help prevent falls. This information is not intended to replace advice given to you by your health care provider. Make sure you discuss any questions you have with your health care provider. Document Released: 09/15/2009 Document Revised: 04/26/2016 Document Reviewed: 12/24/2014 Elsevier Interactive Patient Education  2018 [place] Maintenance, Female Adopting a healthy lifestyle and getting preventive care can go a long way to promote health and wellness. Talk with your health care provider about what schedule of regular examinations is right for you. This is a good chance for you to check in with your provider about disease prevention and staying healthy. In between checkups, there are plenty of things you can do on your own. Experts have done a lot of research about which lifestyle changes and preventive measures are most likely to keep you healthy. Ask your health care provider for more information. Weight and diet Eat a healthy diet  Be sure to include plenty of vegetables, fruits, low-fat dairy products, and lean protein.  Do not eat a lot of foods high in solid fats, added sugars, or salt.  Get regular exercise. This is one of the most important things you can do for your health. ? Most adults should exercise for at least 150 minutes each week. The exercise should increase your  heart rate and make you sweat (moderate-intensity exercise). ? Most adults should also do strengthening exercises at least twice a week.  This is in addition to the moderate-intensity exercise.  Maintain a healthy weight  Body mass index (BMI) is a measurement that can be used to identify possible weight problems. It estimates body fat based on height and weight. Your health care provider can help determine your BMI and help you achieve or maintain a healthy weight.  For females 18 years of age and older: ? A BMI below 18.5 is considered underweight. ? A BMI of 18.5 to 24.9 is normal. ? A BMI of 25 to 29.9 is considered overweight. ? A BMI of 30 and above is considered obese.  Watch levels of cholesterol and blood lipids  You should start having your blood tested for lipids and cholesterol at 75 years of age, then have this test every 5 years.  You may need to have your cholesterol levels checked more often if: ? Your lipid or cholesterol levels are high. ? You are older than 75 years of age. ? You are at high risk for heart disease.  Cancer screening Lung Cancer  Lung cancer screening is recommended for adults 27-54 years old who are at high risk for lung cancer because of a history of smoking.  A yearly low-dose CT scan of the lungs is recommended for people who: ? Currently smoke. ? Have quit within the past 15 years. ? Have at least a 30-pack-year history of smoking. A pack year is smoking an average of one pack of cigarettes a day for 1 year.  Yearly screening should continue until it has been 15 years since you quit.  Yearly screening should stop if you develop a health problem that would prevent you from having lung cancer treatment.  Breast Cancer  Practice breast self-awareness. This means understanding how your breasts normally appear and feel.  It also means doing regular breast self-exams. Let your health care provider know about any changes, no matter how small.  If you are in your 20s or 30s, you should have a clinical breast exam (CBE) by a health care provider every 1-3 years as part of a  regular health exam.  If you are 44 or older, have a CBE every year. Also consider having a breast X-ray (mammogram) every year.  If you have a family history of breast cancer, talk to your health care provider about genetic screening.  If you are at high risk for breast cancer, talk to your health care provider about having an MRI and a mammogram every year.  Breast cancer gene (BRCA) assessment is recommended for women who have family members with BRCA-related cancers. BRCA-related cancers include: ? Breast. ? Ovarian. ? Tubal. ? Peritoneal cancers.  Results of the assessment will determine the need for genetic counseling and BRCA1 and BRCA2 testing.  Cervical Cancer Your health care provider may recommend that you be screened regularly for cancer of the pelvic organs (ovaries, uterus, and vagina). This screening involves a pelvic examination, including checking for microscopic changes to the surface of your cervix (Pap test). You may be encouraged to have this screening done every 3 years, beginning at age 28.  For women ages 50-65, health care providers may recommend pelvic exams and Pap testing every 3 years, or they may recommend the Pap and pelvic exam, combined with testing for human papilloma virus (HPV), every 5 years. Some types of HPV increase your risk of cervical cancer.  Testing for HPV may also be done on women of any age with unclear Pap test results.  Other health care providers may not recommend any screening for nonpregnant women who are considered low risk for pelvic cancer and who do not have symptoms. Ask your health care provider if a screening pelvic exam is right for you.  If you have had past treatment for cervical cancer or a condition that could lead to cancer, you need Pap tests and screening for cancer for at least 20 years after your treatment. If Pap tests have been discontinued, your risk factors (such as having a new sexual partner) need to be reassessed to  determine if screening should resume. Some women have medical problems that increase the chance of getting cervical cancer. In these cases, your health care provider may recommend more frequent screening and Pap tests.  Colorectal Cancer  This type of cancer can be detected and often prevented.  Routine colorectal cancer screening usually begins at 74 years of age and continues through 75 years of age.  Your health care provider may recommend screening at an earlier age if you have risk factors for colon cancer.  Your health care provider may also recommend using home test kits to check for hidden blood in the stool.  A small camera at the end of a tube can be used to examine your colon directly (sigmoidoscopy or colonoscopy). This is done to check for the earliest forms of colorectal cancer.  Routine screening usually begins at age 70.  Direct examination of the colon should be repeated every 5-10 years through 75 years of age. However, you may need to be screened more often if early forms of precancerous polyps or small growths are found.  Skin Cancer  Check your skin from head to toe regularly.  Tell your health care provider about any new moles or changes in moles, especially if there is a change in a mole's shape or color.  Also tell your health care provider if you have a mole that is larger than the size of a pencil eraser.  Always use sunscreen. Apply sunscreen liberally and repeatedly throughout the day.  Protect yourself by wearing long sleeves, pants, a wide-brimmed hat, and sunglasses whenever you are outside.  Heart disease, diabetes, and high blood pressure  High blood pressure causes heart disease and increases the risk of stroke. High blood pressure is more likely to develop in: ? People who have blood pressure in the high end of the normal range (130-139/85-89 mm Hg). ? People who are overweight or obese. ? People who are African American.  If you are 18-39 years  of age, have your blood pressure checked every 3-5 years. If you are 60 years of age or older, have your blood pressure checked every year. You should have your blood pressure measured twice-once when you are at a hospital or clinic, and once when you are not at a hospital or clinic. Record the average of the two measurements. To check your blood pressure when you are not at a hospital or clinic, you can use: ? An automated blood pressure machine at a pharmacy. ? A home blood pressure monitor.  If you are between 31 years and 32 years old, ask your health care provider if you should take aspirin to prevent strokes.  Have regular diabetes screenings. This involves taking a blood sample to check your fasting blood sugar level. ? If you are at a normal weight and have a low  risk for diabetes, have this test once every three years after 75 years of age. ? If you are overweight and have a high risk for diabetes, consider being tested at a younger age or more often. Preventing infection Hepatitis B  If you have a higher risk for hepatitis B, you should be screened for this virus. You are considered at high risk for hepatitis B if: ? You were born in a country where hepatitis B is common. Ask your health care provider which countries are considered high risk. ? Your parents were born in a high-risk country, and you have not been immunized against hepatitis B (hepatitis B vaccine). ? You have HIV or AIDS. ? You use needles to inject street drugs. ? You live with someone who has hepatitis B. ? You have had sex with someone who has hepatitis B. ? You get hemodialysis treatment. ? You take certain medicines for conditions, including cancer, organ transplantation, and autoimmune conditions.  Hepatitis C  Blood testing is recommended for: ? Everyone born from [birth year] through 1964. ? Anyone with known risk factors for hepatitis C.  Sexually transmitted infections (STIs)  You should be screened for  sexually transmitted infections (STIs) including gonorrhea and chlamydia if: ? You are sexually active and are younger than 75 years of age. ? You are older than 75 years of age and your health care provider tells you that you are at risk for this type of infection. ? Your sexual activity has changed since you were last screened and you are at an increased risk for chlamydia or gonorrhea. Ask your health care provider if you are at risk.  If you do not have HIV, but are at risk, it may be recommended that you take a prescription medicine daily to prevent HIV infection. This is called pre-exposure prophylaxis (PrEP). You are considered at risk if: ? You are sexually active and do not regularly use condoms or know the HIV status of your partner(s). ? You take drugs by injection. ? You are sexually active with a partner who has HIV.  Talk with your health care provider about whether you are at high risk of being infected with HIV. If you choose to begin PrEP, you should first be tested for HIV. You should then be tested every 3 months for as long as you are taking PrEP. Pregnancy  If you are premenopausal and you may become pregnant, ask your health care provider about preconception counseling.  If you may become pregnant, take 400 to 800 micrograms (mcg) of folic acid every day.  If you want to prevent pregnancy, talk to your health care provider about birth control (contraception). Osteoporosis and menopause  Osteoporosis is a disease in which the bones lose minerals and strength with aging. This can result in serious bone fractures. Your risk for osteoporosis can be identified using a bone density scan.  If you are 28 years of age or older, or if you are at risk for osteoporosis and fractures, ask your health care provider if you should be screened.  Ask your health care provider whether you should take a calcium or vitamin D supplement to lower your risk for osteoporosis.  Menopause may  have certain physical symptoms and risks.  Hormone replacement therapy may reduce some of these symptoms and risks. Talk to your health care provider about whether hormone replacement therapy is right for you. Follow these instructions at home:  Schedule regular health, dental, and eye exams.  Stay current with  your immunizations.  Do not use any tobacco products including cigarettes, chewing tobacco, or electronic cigarettes.  If you are pregnant, do not drink alcohol.  If you are breastfeeding, limit how much and how often you drink alcohol.  Limit alcohol intake to no more than 1 drink per day for nonpregnant women. One drink equals 12 ounces of beer, 5 ounces of wine, or 1 ounces of hard liquor.  Do not use street drugs.  Do not share needles.  Ask your health care provider for help if you need support or information about quitting drugs.  Tell your health care provider if you often feel depressed.  Tell your health care provider if you have ever been abused or do not feel safe at home. This information is not intended to replace advice given to you by your health care provider. Make sure you discuss any questions you have with your health care provider. Document Released: 06/04/2011 Document Revised: 04/26/2016 Document Reviewed: 08/23/2015 Elsevier Interactive Patient Education  [employer].

## 2017-05-10 ENCOUNTER — Ambulatory Visit (INDEPENDENT_AMBULATORY_CARE_PROVIDER_SITE_OTHER)
Admission: RE | Admit: 2017-05-10 | Discharge: 2017-05-10 | Disposition: A | Discharge status: Home / Self Care | Payer: Medicare Other | Source: Ambulatory Visit | Attending: Acute Care | Admitting: Acute Care

## 2017-05-10 ENCOUNTER — Encounter: Payer: Self-pay | Admitting: Cardiovascular Disease

## 2017-05-10 ENCOUNTER — Ambulatory Visit (INDEPENDENT_AMBULATORY_CARE_PROVIDER_SITE_OTHER): Payer: Medicare Other | Admitting: Cardiovascular Disease

## 2017-05-10 ENCOUNTER — Encounter (INDEPENDENT_AMBULATORY_CARE_PROVIDER_SITE_OTHER): Payer: Self-pay

## 2017-05-10 VITALS — BP 122/80 | HR 75 | Ht 65.0 in | Wt 99.4 lb

## 2017-05-10 DIAGNOSIS — J439 Emphysema, unspecified: Secondary | ICD-10-CM | POA: Diagnosis not present

## 2017-05-10 DIAGNOSIS — I10 Essential (primary) hypertension: Secondary | ICD-10-CM

## 2017-05-10 DIAGNOSIS — I5181 Takotsubo syndrome: Secondary | ICD-10-CM | POA: Diagnosis not present

## 2017-05-10 DIAGNOSIS — F17210 Nicotine dependence, cigarettes, uncomplicated: Secondary | ICD-10-CM

## 2017-05-10 DIAGNOSIS — R918 Other nonspecific abnormal finding of lung field: Secondary | ICD-10-CM

## 2017-05-10 DIAGNOSIS — I2510 Atherosclerotic heart disease of native coronary artery without angina pectoris: Secondary | ICD-10-CM

## 2017-05-10 DIAGNOSIS — I6523 Occlusion and stenosis of bilateral carotid arteries: Secondary | ICD-10-CM

## 2017-05-10 NOTE — Progress Notes (Signed)
Cardiology Office Note Date:  05/13/2017   ID:  REILLY MOLCHAN, DOB 06-17-1941, MRN [id number]  PCP:  Marletta Lor, MD  Cardiologist:  Sherren Mocha, MD    Chief Complaint  Patient presents with  . Fatigue     History of Present Illness: [name] is a 76 y.o. female who presents for  follow-up of CAD and stress cardiomyopathy. She initially presented with non-STEMI in 2008 treated with BMS to the mid LAD. LHC in 2013 demonstrated patent stent in mid LAD and diffuse nonobstructive disease in the RCA and LCx. LV function has previously been normal. She was then admitted in June 2017 with an anterolateral STEMI. Cardiac catheterization demonstrated diffuse moderate coronary artery disease but no evidence for a culprit vessel and she was felt to have stress-induced cardiomyopathy with apical ballooning. Follow-up echocardiography demonstrated normalization of her LV function.  The patient is here with her son today. At time of her last visit she complained of abdominal discomfort and weight loss. Because of extensive cardiovascular disease and long-standing smoking, I have some suspicion for mesenteric ischemia. A CT of the abdomen was performed and this demonstrated patent mesenteric arteries. She continues to struggle with poor appetite and abdominal discomfort. She has not followed up with GI. She otherwise is doing okay. She denies chest pain or pressure, heart palpitations, or syncope. No orthopnea or PND. She has mild shortness of breath with activity. She also has had a few episodes where she has become lightheaded and diaphoretic. She has not had frank syncope.  Past Medical History:  Diagnosis Date  . Anemia   . Arthritis   . Blood transfusion without reported diagnosis   . Bronchitis    hx of  . CAD (coronary artery disease)    a. BMS to LAD 2008, multivessel disease // b. LHC 6/17 (ant-lat STEMI): LM ok, pLAD 70, mLAD 50, dLAD 60, oD2 70, oLCx 60, pRCA 30, dRCA 50,  EF 35-45% with apical ballooning - no culprit for MI and no PCI performed - prob TakoTsubo CM  . Carotid artery disease ([place])    Carotid US 7/82: RICA 95-62%, LICA 1-30% >> follow-up 1 year  . Chronic back pain   . Diverticulitis   . Diverticulosis of colon (without mention of hemorrhage)   . Essential hypertension    Dr. Bluford Kaufmann [phone] at [facility]  . Hemorrhage of gastrointestinal tract, unspecified   . History of echocardiogram    a. Echo 05/14/16: Mild focal basal septal hypertrophy, EF 60-65%, normal wall motion, grade 2 diastolic dysfunction, mild TR  . History of non-ST elevation myocardial infarction (NSTEMI) 2008  . History of pneumonia   . Hyperlipidemia   . Hypothyroidism   . 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 Personal history of colonic polyps    Dr. Ardis Hughs, GI  . Raynaud's syndrome   . Takotsubo cardiomyopathy    a. Admitted 6/17 with anterolateral STEMI >> no culprit on LHC and EF 35-45% with apical ballooning // b. Echo prior to discharge with EF 60-65% and normal wall motion //  c. Limited echo 7/17: EF 55-60%, no RWMA, no pericardial effusion  . Ulcer   . Urinary frequency     Past Surgical History:  Procedure Laterality Date  . ABDOMINAL HYSTERECTOMY    . ABDOMINAL HYSTERECTOMY  1975  . APPENDECTOMY  1955  . BREAST EXCISIONAL BIOPSY Right 1969   no visible scar  . CARDIAC CATHETERIZATION  N/A 05/13/2016   Procedure: Left Heart Cath and Coronary Angiography;  Surgeon: Wellington Hampshire, MD;  Location: [facility];  Service: Cardiovascular;  Laterality: N/A;  . CHOLECYSTECTOMY    . CORONARY ANGIOPLASTY WITH STENT PLACEMENT     x1, 2007or 2008  . GASTROINTESTINAL STOMAL TUMOR RESECTION    . LAPAROSCOPIC PARTIAL COLECTOMY  12/23/2012   Procedure: LAPAROSCOPIC PARTIAL COLECTOMY;  Surgeon: Stark Klein, MD;  Location: [facility];  Service: General;  Laterality: N/A;  . PARTIAL COLECTOMY  12/23/2012  . TUBAL  LIGATION      Current Outpatient Prescriptions  Medication Sig Dispense Refill  . aspirin EC 81 MG EC tablet Take 1 tablet (81 mg total) by mouth once. 30 tablet 11  . benzonatate (TESSALON PERLES) 100 MG capsule Take 1 capsule (100 mg total) by mouth 3 (three) times daily as needed for cough. 20 capsule 0  . CYANOCOBALAMIN IJ Inject 1,000 mcg as directed every 30 (thirty) days.    Marland Kitchen ibuprofen (ADVIL,MOTRIN) 200 MG tablet Take 400 mg by mouth daily as needed (arthritis pain).    Marland Kitchen levothyroxine (SYNTHROID, LEVOTHROID) 100 MCG tablet Take 1 tablet (100 mcg total) by mouth daily before breakfast. 90 tablet 2  . metoprolol succinate (TOPROL-XL) 25 MG 24 hr tablet Take 1 tablet (25 mg total) by mouth daily. 30 tablet 6  . nitroGLYCERIN (NITROSTAT) 0.4 MG SL tablet Place 1 tablet (0.4 mg total) under the tongue every 5 (five) minutes as needed for chest pain. 25 tablet 3   No current facility-administered medications for this visit.     Allergies:   Patient has no known allergies.   Social History:  The patient  reports that she has been smoking Cigarettes.  She has a 30.00 pack-year smoking history. She has never used smokeless tobacco. She reports that she does not drink alcohol or use drugs.   Family History:  The patient's family history includes Breast cancer in her mother; Cerebral aneurysm in her sister; Heart disease in her mother; Lung cancer in her father.    ROS:  Please see the history of present illness.  Otherwise, review of systems is positive for appetite change/anorexia, hearing loss, abdominal pain, diarrhea, back pain, snoring, easy bruising.  All other systems are reviewed and negative.    PHYSICAL EXAM: VS:  BP 122/80   Pulse 75   Ht 5\' 5"  (1.651 m)   Wt 99 lb 6.4 oz (45.1 kg)   SpO2 95%   BMI 16.54 kg/m  , BMI Body mass index is 16.54 kg/m. GEN: thin woman, in no acute distress  HEENT: normal  Neck: no JVD, no masses. No carotid bruits Cardiac: RRR without  murmur or gallop                Respiratory:  clear to auscultation bilaterally, normal work of breathing GI: soft, nontender, nondistended, + BS MS: no deformity or atrophy  Ext: no pretibial edema, pedal pulses 2+= bilaterally Skin: warm and dry, no rash Neuro:  Strength and sensation are intact Psych: euthymic mood, full affect  EKG:  EKG is ordered today. The ekg ordered today shows normal sinus rhythm 72 bpm, biatrial enlargement, left axis deviation, pulmonary disease pattern.  Recent Labs: 05/14/2016: Magnesium 1.9 01/07/2017: ALT 10; BUN 12; Creatinine, Ser 0.77; Hemoglobin 13.4; Platelets 172.0; Potassium 4.4; Sodium 140; TSH 0.03   Lipid Panel     Component Value Date/Time   CHOL 174 05/13/2016 1339   TRIG 91 05/13/2016  1339   HDL 57 05/13/2016 1339   CHOLHDL 3.1 05/13/2016 1339   VLDL 18 05/13/2016 1339   LDLCALC 99 05/13/2016 1339   LDLDIRECT 181.6 08/26/2013 1041      Wt Readings from Last 3 Encounters:  05/10/17 99 lb 6.4 oz (45.1 kg)  05/03/17 100 lb 2 oz (45.4 kg)  02/12/17 100 lb 8 oz (45.6 kg)     Cardiac Studies Reviewed: 2-D echocardiogram 06/08/2016: Study Conclusions  - Left ventricle: Abnormal septal motion The cavity size was   normal. Systolic function was normal. The estimated ejection   fraction was in the range of 55% to 60%. Wall motion was normal;   there were no regional wall motion abnormalities. Left   ventricular diastolic function parameters were normal. - Atrial septum: No defect or patent foramen ovale was identified.  CTA 10-04-2016: IMPRESSION: VASCULAR  No evidence for mesenteric artery stenosis. The main mesenteric arteries are widely patent.  Atherosclerotic disease in the aorta and iliac arteries without aneurysm or dissection.  NON-VASCULAR  No acute abnormality in the abdomen or pelvis.  Postsurgical changes involving the right colon.  Diverticulosis without acute colonic inflammation.  ASSESSMENT AND  PLAN: 1.  CAD, native vessel, without angina:Patient is stable. Medications are reviewed and she will continue on aspirin and a beta blocker.  2. Stress-induced cardiomyopathy: No signs of heart failure on exam. LV function normalized on follow-up echo. Continue metoprolol succinate. With intermittent dizziness and fatigue I am not inclined to start an ACE/ARB especially since her LV function has normalized.  3. Carotid stenosis without history of stroke: Most recent carotid duplex scan reviewed with the resolved outlined below. Continue medical management.  Impressions Duplex imaging, with color Doppler, of the carotid arteries reveals heterogeneous plaque with shadowing in both CCA's, ECA's, and ICA's. The RICA velocities are elevated and have decreased compared to prior exam. The LICA velocities remain within normal range and essentially stable. Bilateral ECA velocities are elevated. The subclavian arteries are widely patent, with normal velocity flow. The vertebral arteries are patent with antegrade flow, bilaterally. Technologist Notes Plaque Plaque Examination Data cm/s cm/s Heterogeneous plaque with shadowing, bilaterally. Essentially stable 40-59% RICA stenosis. Essentially stable 3-49% LICA stenosis. >50% ECA stenosis, bilaterally. Normal subclavian arteries, bilaterally. Patent vertebral arteries with antegrade flow. f/u 1 year  4. Tobacco abuse: Ongoing issue, not ready to quit.  Current medicines are reviewed with the patient today.  The patient does not have concerns regarding medicines.  Labs/ tests ordered today include:   Orders Placed This Encounter  Procedures  . EKG 12-Lead    Disposition:   FU one year  Signed, Sherren Mocha, MD  05/13/2017 10:26 PM    [facility] [place], [place],   [address] Phone: [phone]; Fax: [phone]

## 2017-05-10 NOTE — Patient Instructions (Signed)

## 2017-05-15 ENCOUNTER — Encounter (HOSPITAL_COMMUNITY): Payer: Self-pay | Admitting: *Deleted

## 2017-05-15 ENCOUNTER — Emergency Department (HOSPITAL_COMMUNITY)
Admission: EM | Admit: 2017-05-15 | Discharge: 2017-05-16 | Disposition: A | Discharge status: Home / Self Care | Payer: Medicare Other | Attending: Emergency Medicine | Admitting: Emergency Medicine

## 2017-05-15 ENCOUNTER — Emergency Department (HOSPITAL_COMMUNITY): Payer: Medicare Other

## 2017-05-15 DIAGNOSIS — F17210 Nicotine dependence, cigarettes, uncomplicated: Secondary | ICD-10-CM | POA: Insufficient documentation

## 2017-05-15 DIAGNOSIS — E039 Hypothyroidism, unspecified: Secondary | ICD-10-CM | POA: Insufficient documentation

## 2017-05-15 DIAGNOSIS — Z955 Presence of coronary angioplasty implant and graft: Secondary | ICD-10-CM | POA: Insufficient documentation

## 2017-05-15 DIAGNOSIS — R200 Anesthesia of skin: Secondary | ICD-10-CM | POA: Diagnosis not present

## 2017-05-15 DIAGNOSIS — I6789 Other cerebrovascular disease: Secondary | ICD-10-CM | POA: Diagnosis not present

## 2017-05-15 DIAGNOSIS — Z79899 Other long term (current) drug therapy: Secondary | ICD-10-CM | POA: Insufficient documentation

## 2017-05-15 DIAGNOSIS — I10 Essential (primary) hypertension: Secondary | ICD-10-CM | POA: Diagnosis not present

## 2017-05-15 DIAGNOSIS — I2510 Atherosclerotic heart disease of native coronary artery without angina pectoris: Secondary | ICD-10-CM | POA: Insufficient documentation

## 2017-05-15 DIAGNOSIS — R531 Weakness: Secondary | ICD-10-CM | POA: Diagnosis not present

## 2017-05-15 DIAGNOSIS — R51 Headache: Secondary | ICD-10-CM | POA: Diagnosis present

## 2017-05-15 DIAGNOSIS — Z7982 Long term (current) use of aspirin: Secondary | ICD-10-CM | POA: Insufficient documentation

## 2017-05-15 DIAGNOSIS — R6883 Chills (without fever): Secondary | ICD-10-CM | POA: Diagnosis not present

## 2017-05-15 LAB — I-STAT CHEM 8, ED
BUN: 17 mg/dL (ref 6–20)
CREATININE: 0.9 mg/dL (ref 0.44–1.00)
Calcium, Ion: 1.11 mmol/L — ABNORMAL LOW (ref 1.15–1.40)
Chloride: 99 mmol/L — ABNORMAL LOW (ref 101–111)
GLUCOSE: 100 mg/dL — AB (ref 65–99)
HEMATOCRIT: 40 % (ref 36.0–46.0)
HEMOGLOBIN: 13.6 g/dL (ref 12.0–15.0)
Potassium: 4.2 mmol/L (ref 3.5–5.1)
Sodium: 139 mmol/L (ref 135–145)
TCO2: 29 mmol/L (ref 0–100)

## 2017-05-15 LAB — I-STAT TROPONIN, ED: Troponin i, poc: 0 ng/mL (ref 0.00–0.08)

## 2017-05-15 MED ORDER — PREDNISONE 20 MG PO TABS: 40.0000 mg | ORAL_TABLET | Freq: Every day | ORAL | 0 refills | Status: DC

## 2017-05-15 MED ORDER — PREDNISONE 20 MG PO TABS
60.0000 mg | ORAL_TABLET | ORAL | Status: AC
  Administered 2017-05-15: 60 mg via ORAL
  Filled 2017-05-15: qty 3

## 2017-05-15 NOTE — ED Notes (Signed)
The pt is reporting that her headache is not as bad as it was.  Son at the bedside

## 2017-05-15 NOTE — Discharge Instructions (Signed)
As discussed, your evaluation today has been largely reassuring.  But, it is important that you monitor your condition carefully, and do not hesitate to return to the ED if you develop new, or concerning changes in your condition. ? ?Otherwise, please follow-up with our physicians for appropriate ongoing care. ? ?

## 2017-05-16 ENCOUNTER — Ambulatory Visit (INDEPENDENT_AMBULATORY_CARE_PROVIDER_SITE_OTHER): Payer: Medicare Other | Admitting: Internal Medicine

## 2017-05-16 ENCOUNTER — Other Ambulatory Visit: Payer: Self-pay | Admitting: Acute Care

## 2017-05-16 ENCOUNTER — Encounter: Payer: Self-pay | Admitting: Internal Medicine

## 2017-05-16 VITALS — BP 136/72 | HR 73 | Temp 97.7°F | Ht 65.0 in | Wt 100.4 lb

## 2017-05-16 DIAGNOSIS — E538 Deficiency of other specified B group vitamins: Secondary | ICD-10-CM

## 2017-05-16 DIAGNOSIS — E039 Hypothyroidism, unspecified: Secondary | ICD-10-CM | POA: Diagnosis not present

## 2017-05-16 DIAGNOSIS — I10 Essential (primary) hypertension: Secondary | ICD-10-CM

## 2017-05-16 DIAGNOSIS — E785 Hyperlipidemia, unspecified: Secondary | ICD-10-CM | POA: Diagnosis not present

## 2017-05-16 DIAGNOSIS — F17210 Nicotine dependence, cigarettes, uncomplicated: Principal | ICD-10-CM

## 2017-05-16 DIAGNOSIS — I6523 Occlusion and stenosis of bilateral carotid arteries: Secondary | ICD-10-CM | POA: Diagnosis not present

## 2017-05-16 DIAGNOSIS — Z720 Tobacco use: Secondary | ICD-10-CM

## 2017-05-16 LAB — TSH: TSH: 0.06 u[IU]/mL — AB (ref 0.35–4.50)

## 2017-05-16 MED ORDER — CYANOCOBALAMIN 1000 MCG/ML IJ SOLN
1000.0000 ug | Freq: Once | INTRAMUSCULAR | Status: AC
  Administered 2017-05-16: 1000 ug via INTRAMUSCULAR

## 2017-05-16 NOTE — Patient Instructions (Addendum)
WE NOW OFFER   [name] FAST TRACK!!!  SAME DAY Appointments for ACUTE CARE  Such as: Sprains, Injuries, cuts, abrasions, rashes, muscle pain, joint pain, back pain Colds, flu, sore throats, headache, allergies, cough, fever  Ear pain, sinus and eye infections Abdominal pain, nausea, vomiting, diarrhea, upset stomach Animal/insect bites  3 Easy Ways to Schedule: Walk-In Scheduling Call in scheduling Mychart Sign-up: [email or web address]    Limit your sodium (Salt) intake  Please check your blood pressure on a regular basis.  If it is consistently greater than 150/90, please make an office appointment.  Return in 4 months for follow-up     

## 2017-05-16 NOTE — Progress Notes (Signed)
Subjective:    Patient ID: [name], female    DOB: Jun 06, 1941, 76 y.o.   MRN: [id number]  HPI 76 year old patient who is seen today in follow-up. She has recently seen by cardiology. She was seen in the ED last night due to weakness and chills.  Today she feels that she is back to baseline.  She is accompanied by her son today. She has hypothyroidism and TSH of 4 months ago was suppressed Her weight is stable but she remains undernourished and unable to gain weight. She has essential hypertension.  She has a history of stress cardiomyopathy and last 2-D echocardiogram 11 months ago revealed normal LV function.  Past Medical History:  Diagnosis Date  . Anemia   . Arthritis   . Blood transfusion without reported diagnosis   . Bronchitis    hx of  . CAD (coronary artery disease)    a. BMS to LAD 2008, multivessel disease // b. LHC 6/17 (ant-lat STEMI): LM ok, pLAD 70, mLAD 50, dLAD 60, oD2 70, oLCx 60, pRCA 30, dRCA 50, EF 35-45% with apical ballooning - no culprit for MI and no PCI performed - prob TakoTsubo CM  . Carotid artery disease ([place])    Carotid US 4/19: RICA 62-22%, LICA 9-79% >> follow-up 1 year  . Chronic back pain   . Diverticulitis   . Diverticulosis of colon (without mention of hemorrhage)   . Essential hypertension    Dr. Bluford Kaufmann [phone] at [facility]  . Hemorrhage of gastrointestinal tract, unspecified   . History of echocardiogram    a. Echo 05/14/16: Mild focal basal septal hypertrophy, EF 60-65%, normal wall motion, grade 2 diastolic dysfunction, mild TR  . History of non-ST elevation myocardial infarction (NSTEMI) 2008  . History of pneumonia   . Hyperlipidemia   . Hypothyroidism   . 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 Personal history of colonic polyps    Dr. Ardis Hughs, GI  . Raynaud's syndrome   . Takotsubo cardiomyopathy    a. Admitted 6/17 with anterolateral STEMI >> no culprit on LHC and EF 35-45%  with apical ballooning // b. Echo prior to discharge with EF 60-65% and normal wall motion //  c. Limited echo 7/17: EF 55-60%, no RWMA, no pericardial effusion  . Ulcer   . Urinary frequency      Social History   Social History  . Marital status: Married    Spouse name: N/A  . Number of children: N/A  . 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 Smoking status: Current Every Day Smoker    Packs/day: 0.75    Years: 40.00    Types: Cigarettes  . Smokeless tobacco: Never Used  . Alcohol use No  . Drug use: No  . Sexual activity: Not on file   Other Topics Concern  . Not on file   Social History Narrative  . No narrative on file    Past Surgical History:  Procedure Laterality Date  . ABDOMINAL HYSTERECTOMY    . ABDOMINAL HYSTERECTOMY  1975  . APPENDECTOMY  1955  . BREAST EXCISIONAL BIOPSY Right 1969   no visible scar  . CARDIAC CATHETERIZATION N/A 05/13/2016   Procedure: Left Heart Cath and Coronary Angiography;  Surgeon: Wellington Hampshire, MD;  Location: [facility];  Service: Cardiovascular;  Laterality: N/A;  . CHOLECYSTECTOMY    . CORONARY  ANGIOPLASTY WITH STENT PLACEMENT     x1, 2007or 2008  . GASTROINTESTINAL STOMAL TUMOR RESECTION    . LAPAROSCOPIC PARTIAL COLECTOMY  12/23/2012   Procedure: LAPAROSCOPIC PARTIAL COLECTOMY;  Surgeon: Stark Klein, MD;  Location: [facility];  Service: General;  Laterality: N/A;  . PARTIAL COLECTOMY  12/23/2012  . TUBAL LIGATION      Family History  Problem Relation Age of Onset  . Heart disease Mother        Verdie Drown  . Breast cancer Mother   . Lung cancer Father        SISTER, UNCLE  . Cerebral aneurysm Sister        UNCLE NEPHEW  . Colon cancer Neg Hx   . Esophageal cancer Neg Hx   . Stomach cancer Neg Hx   . Rectal cancer Neg Hx     No Known Allergies  Current Outpatient Prescriptions on File Prior to Visit  Medication Sig Dispense Refill  . aspirin EC  81 MG EC tablet Take 1 tablet (81 mg total) by mouth once. (Patient taking differently: Take 81 mg by mouth daily. ) 30 tablet 11  . benzonatate (TESSALON PERLES) 100 MG capsule Take 1 capsule (100 mg total) by mouth 3 (three) times daily as needed for cough. 20 capsule 0  . CYANOCOBALAMIN IJ Inject 1,000 mcg as directed every 30 (thirty) days.    Marland Kitchen ibuprofen (ADVIL,MOTRIN) 200 MG tablet Take 400 mg by mouth daily as needed (arthritis pain).    Marland Kitchen levothyroxine (SYNTHROID, LEVOTHROID) 100 MCG tablet Take 1 tablet (100 mcg total) by mouth daily before breakfast. 90 tablet 2  . metoprolol succinate (TOPROL-XL) 25 MG 24 hr tablet Take 1 tablet (25 mg total) by mouth daily. (Patient taking differently: Take 50 mg by mouth daily. ) 30 tablet 6  . nitroGLYCERIN (NITROSTAT) 0.4 MG SL tablet Place 1 tablet (0.4 mg total) under the tongue every 5 (five) minutes as needed for chest pain. 25 tablet 3  . predniSONE (DELTASONE) 20 MG tablet Take 2 tablets (40 mg total) by mouth daily with breakfast. For the next four days 8 tablet 0   No current facility-administered medications on file prior to visit.     BP 136/72 (BP Location: Left Arm, Patient Position: Sitting, Cuff Size: Normal)   Pulse 73   Temp 97.7 F (36.5 C) (Oral)   Ht 5\' 5"  (1.651 m)   Wt 100 lb 6.4 oz (45.5 kg)   SpO2 98%   BMI 16.71 kg/m      Review of Systems  Constitutional: Positive for activity change and unexpected weight change.  HENT: Negative for congestion, dental problem, hearing loss, rhinorrhea, sinus pressure, sore throat and tinnitus.   Eyes: Negative for pain, discharge and visual disturbance.  Respiratory: Negative for cough and shortness of breath.   Cardiovascular: Negative for chest pain, palpitations and leg swelling.  Gastrointestinal: Negative for abdominal distention, abdominal pain, blood in stool, constipation, diarrhea, nausea and vomiting.  Genitourinary: Negative for difficulty urinating, dysuria, flank  pain, frequency, hematuria, pelvic pain, urgency, vaginal bleeding, vaginal discharge and vaginal pain.  Musculoskeletal: Negative for arthralgias, gait problem and joint swelling.  Skin: Negative for rash.  Neurological: Positive for weakness. Negative for dizziness, syncope, speech difficulty, numbness and headaches.  Hematological: Negative for adenopathy.  Psychiatric/Behavioral: Negative for agitation, behavioral problems and dysphoric mood. The patient is not nervous/anxious.        Objective:   Physical Exam  Constitutional: She is oriented to person,  place, and time. She appears well-developed and well-nourished.  Elderly, frail, alert, no distress Blood pressure 126/70  HENT:  Head: Normocephalic.  Right Ear: External ear normal.  Left Ear: External ear normal.  Mouth/Throat: Oropharynx is clear and moist.  Eyes: Conjunctivae and EOM are normal. Pupils are equal, round, and reactive to light.  Neck: Normal range of motion. Neck supple. No thyromegaly present.  Cardiovascular: Normal rate, regular rhythm, normal heart sounds and intact distal pulses.   Pulmonary/Chest: Effort normal and breath sounds normal.  Abdominal: Soft. Bowel sounds are normal. She exhibits no mass. There is no tenderness.  Musculoskeletal: Normal range of motion.  Lymphadenopathy:    She has no cervical adenopathy.  Neurological: She is alert and oriented to person, place, and time.  Skin: Skin is warm and dry. No rash noted.  Psychiatric: She has a normal mood and affect. Her behavior is normal.          Assessment & Plan:   Essential hypertension, stable Hypothyroidism.  We'll check a TSH Carotid artery stenosis History of ongoing tobacco use.  Status post recent low dose chest CT screening.  Total smoking cessation encouraged Emphysema  Follow-up 4 months  Lyndsay Talamante Pilar Plate

## 2017-05-16 NOTE — ED Provider Notes (Signed)
[facility] DEPT Provider Note   CSN: [id number] Arrival date & time: 05/15/17  1949     History   Chief Complaint Chief Complaint  Patient presents with  . Headache    HPI [name] is a 76 y.o. female.  HPI This elderly female with multiple medical issues presents with her son, who provides many details of her history of present illness. Patient herself states she feels generally better, though she acknowledges that earlier in the day she felt weak, chilled, had a headache. The son notes that upon awakening this morning, over 12 hours ago the patient appeared to be having some difficulty with balance, complained of headache. No clear precipitant, and the dizziness/balance issue seemed to resolve. However, with persistent headache, and the patient's description of chills, she is here for evaluation. She notes no recent changes in medication, diet, activity. She does describe generalized worsening weakness over some time, with unclear onset.  She does continue to smoke.     Past Medical History:  Diagnosis Date  . Anemia   . Arthritis   . Blood transfusion without reported diagnosis   . Bronchitis    hx of  . CAD (coronary artery disease)    a. BMS to LAD 2008, multivessel disease // b. LHC 6/17 (ant-lat STEMI): LM ok, pLAD 70, mLAD 50, dLAD 60, oD2 70, oLCx 60, pRCA 30, dRCA 50, EF 35-45% with apical ballooning - no culprit for MI and no PCI performed - prob TakoTsubo CM  . Carotid artery disease ([place])    Carotid US 2/09: RICA 47-09%, LICA 6-28% >> follow-up 1 year  . Chronic back pain   . Diverticulitis   . Diverticulosis of colon (without mention of hemorrhage)   . Essential hypertension    Dr. Bluford Kaufmann [phone] at [place]  . Hemorrhage of gastrointestinal tract, unspecified   . History of echocardiogram    a. Echo 05/14/16: Mild focal basal septal hypertrophy, EF 60-65%, normal wall motion, grade 2 diastolic dysfunction, mild TR  . History  of non-ST elevation myocardial infarction (NSTEMI) 2008  . History of pneumonia   . Hyperlipidemia   . Hypothyroidism   . 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 Personal history of colonic polyps    Dr. Ardis Hughs, GI  . Raynaud's syndrome   . Takotsubo cardiomyopathy    a. Admitted 6/17 with anterolateral STEMI >> no culprit on LHC and EF 35-45% with apical ballooning // b. Echo prior to discharge with EF 60-65% and normal wall motion //  c. Limited echo 7/17: EF 55-60%, no RWMA, no pericardial effusion  . Ulcer   . Urinary frequency     Patient Active Problem List   Diagnosis Date Noted  . Stress-induced cardiomyopathy 05/13/2016  . Acute coronary syndrome ([place]) 05/13/2016  . ST elevation myocardial infarction (STEMI) ([place])   . Vitamin B12 deficiency 08/09/2015  . Vitamin D deficiency 08/09/2015  . Osteoarthritis 02/25/2014  . Adenomatous colon polyp 12/05/2012  . Tobacco abuse 08/22/2012  . History of colonic polyps 07/28/2010  . Carotid artery disease ([place]) 07/03/2010  . CEREBRAL ANEURYSM 06/29/2010  . PALPITATIONS 03/29/2009  . ABDOMINAL PAIN-RLQ 03/29/2009  . Backache 10/04/2008  . Dyslipidemia 03/25/2008  . ANXIETY 03/25/2008  . Coronary atherosclerosis 03/25/2008  . Raynaud's syndrome 03/25/2008  . OSTEOPENIA 03/25/2008  . GASTROINTESTINAL HEMORRHAGE, HX OF 03/25/2008  . Hypothyroidism 02/05/2008  . Essential hypertension 02/05/2008  . UTI 11/10/2007  . URINARY FREQUENCY 11/10/2007  .  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 UNSPECIFIED HEMORRHAGE OF GASTROINTESTINAL TRACT 03/11/2007  . Diverticulosis of colon (without mention of hemorrhage) 02/15/1996    Past Surgical History:  Procedure Laterality Date  . ABDOMINAL HYSTERECTOMY    . ABDOMINAL HYSTERECTOMY  1975  . APPENDECTOMY  1955  . BREAST EXCISIONAL BIOPSY Right 1969   no visible scar  . CARDIAC CATHETERIZATION N/A 05/13/2016   Procedure: Left Heart Cath  and Coronary Angiography;  Surgeon: Wellington Hampshire, MD;  Location: [facility];  Service: Cardiovascular;  Laterality: N/A;  . CHOLECYSTECTOMY    . CORONARY ANGIOPLASTY WITH STENT PLACEMENT     x1, 2007or 2008  . GASTROINTESTINAL STOMAL TUMOR RESECTION    . LAPAROSCOPIC PARTIAL COLECTOMY  12/23/2012   Procedure: LAPAROSCOPIC PARTIAL COLECTOMY;  Surgeon: Stark Klein, MD;  Location: [facility];  Service: General;  Laterality: N/A;  . PARTIAL COLECTOMY  12/23/2012  . TUBAL LIGATION      OB History    No data available       Home Medications    Prior to Admission medications   Medication Sig Start Date End Date Taking? Authorizing Provider  aspirin EC 81 MG EC tablet Take 1 tablet (81 mg total) by mouth once. Patient taking differently: Take 81 mg by mouth daily.  05/15/16  Yes Bhagat, Bhavinkumar, PA  benzonatate (TESSALON PERLES) 100 MG capsule Take 1 capsule (100 mg total) by mouth 3 (three) times daily as needed for cough. 02/12/17  Yes Colin Benton R, DO  CYANOCOBALAMIN IJ Inject 1,000 mcg as directed every 30 (thirty) days.   Yes Marletta Lor, MD  ibuprofen (ADVIL,MOTRIN) 200 MG tablet Take 400 mg by mouth daily as needed (arthritis pain).   Yes [provider]  levothyroxine (SYNTHROID, LEVOTHROID) 100 MCG tablet Take 1 tablet (100 mcg total) by mouth daily before breakfast. 01/08/17  Yes Marletta Lor, MD  metoprolol succinate (TOPROL-XL) 25 MG 24 hr tablet Take 1 tablet (25 mg total) by mouth daily. Patient taking differently: Take 50 mg by mouth daily.  05/15/16  Yes Bhagat, Bhavinkumar, PA  nitroGLYCERIN (NITROSTAT) 0.4 MG SL tablet Place 1 tablet (0.4 mg total) under the tongue every 5 (five) minutes as needed for chest pain. 05/17/16 09/06/17 Yes Marletta Lor, MD  predniSONE (DELTASONE) 20 MG tablet Take 2 tablets (40 mg total) by mouth daily with breakfast. For the next four days 05/15/17   Carmin Muskrat, MD    Family History Family History    Problem Relation Age of Onset  . Heart disease Mother        Verdie Drown  . Breast cancer Mother   . Lung cancer Father        SISTER, UNCLE  . Cerebral aneurysm Sister        UNCLE NEPHEW  . Colon cancer Neg Hx   . Esophageal cancer Neg Hx   . Stomach cancer Neg Hx   . Rectal cancer Neg Hx     Social History Social History  Substance Use Topics  . Smoking status: Current Every Day Smoker    Packs/day: 0.75    Years: 40.00    Types: Cigarettes  . Smokeless tobacco: Never Used  . Alcohol use No     Allergies   Patient has no known allergies.   Review of Systems Review of Systems  Constitutional:       Per HPI, otherwise negative  HENT:       Per  HPI, otherwise negative  Respiratory:       Per HPI, otherwise negative  Cardiovascular:       Per HPI, otherwise negative  Gastrointestinal: Negative for vomiting.  Endocrine:       Negative aside from HPI  Genitourinary:       Neg aside from HPI   Musculoskeletal:       Per HPI, otherwise negative  Skin: Negative.   Neurological: Positive for dizziness and headaches. Negative for syncope.     Physical Exam Updated Vital Signs BP (!) 174/80   Pulse 62   Temp 98.8 F (37.1 C) (Oral)   Resp 18   Ht 5\' 5"  (1.651 m)   Wt 44.9 kg (99 lb)   SpO2 95%   BMI 16.47 kg/m   Physical Exam  Constitutional: She is oriented to person, place, and time. She has a sickly appearance. No distress.  HENT:  Head: Normocephalic and atraumatic.  Eyes: Conjunctivae and EOM are normal.  Cardiovascular: Normal rate and regular rhythm.   Pulmonary/Chest: Effort normal and breath sounds normal. No stridor. No respiratory distress.  Abdominal: She exhibits no distension.  Musculoskeletal: She exhibits no edema.  Neurological: She is alert and oriented to person, place, and time. She displays atrophy. She displays no tremor. No cranial nerve deficit. She exhibits normal muscle tone. She displays no seizure activity.  Skin: Skin  is warm and dry.  Psychiatric: She is slowed.  Nursing note and vitals reviewed.    ED Treatments / Results  Labs (all labs ordered are listed, but only abnormal results are displayed) Labs Reviewed  I-STAT CHEM 8, ED - Abnormal; Notable for the following:       Result Value   Chloride 99 (*)    Glucose, Bld 100 (*)    Calcium, Ion 1.11 (*)    All other components within normal limits  I-STAT TROPOININ, ED    EKG  EKG Interpretation  Date/Time:  Wednesday May 15 2017 20:29:31 EDT Ventricular Rate:  64 PR Interval:    QRS Duration: 86 QT Interval:  428 QTC Calculation: 442 R Axis:   -22 Text Interpretation:  Sinus rhythm RAE, consider biatrial enlargement Consider left ventricular hypertrophy Anterior Q waves, possibly due to LVH Artifact normalization of prior t wave abnormalities Abnormal ekg Confirmed by Carmin Muskrat [phone]) on 05/16/2017 9:42:58 PM       Radiology Dg Chest Port 1 View  Result Date: 05/15/2017 CLINICAL DATA:  Weakness and chills. EXAM: PORTABLE CHEST 1 VIEW COMPARISON:  02/12/2017 FINDINGS: The lungs are hyperinflated without pneumonic consolidation, effusion or pneumothorax. The heart is normal in size. The aorta is slightly uncoiled in appearance and atherosclerotic without aneurysm. No acute nor suspicious osseous lesions. IMPRESSION: Hyperinflated lungs without acute pneumonic consolidation. Aortic atherosclerosis. Electronically Signed   By: Ashley Royalty M.D.   On: 05/15/2017 23:30    Procedures Procedures (including critical care time)  Medications Ordered in ED Medications  predniSONE (DELTASONE) tablet 60 mg (60 mg Oral Given 05/15/17 2355)     Initial Impression / Assessment and Plan / ED Course  I have reviewed the triage vital signs and the nursing notes.  Pertinent labs & imaging results that were available during my care of the patient were reviewed by me and considered in my medical decision making (see chart for details).  On  repeat exam the patient is awake and alert, in no distress. She states that she feels better, has no headache. We discussed all  findings, and after I had a chance to review the patient's chart, including documentation of smoking abuse, possible COPD, aortic stenosis, we discussed possible causes of her symptoms, though we again emphasized to his reassuring findings, no evidence for pneumonia, ACS, bacteremia, sepsis, or other acute new pathology. Patient and son amenable to close outpatient follow-up.  The chart was completed the day following the initial evaluation, and I reviewed the patient's outpatient follow-up note, no notable new findings.  Final Clinical Impressions(s) / ED Diagnoses   Final diagnoses:  Chills  Weakness    New Prescriptions New Prescriptions   PREDNISONE (DELTASONE) 20 MG TABLET    Take 2 tablets (40 mg total) by mouth daily with breakfast. For the next four days     Carmin Muskrat, MD 05/16/17 2144

## 2017-05-17 ENCOUNTER — Other Ambulatory Visit: Payer: Self-pay | Admitting: Internal Medicine

## 2017-05-17 MED ORDER — LEVOTHYROXINE SODIUM 75 MCG PO TABS: 75.0000 ug | ORAL_TABLET | Freq: Every day | ORAL | 2 refills | Status: DC

## 2017-06-07 ENCOUNTER — Ambulatory Visit (INDEPENDENT_AMBULATORY_CARE_PROVIDER_SITE_OTHER): Payer: Medicare Other | Admitting: Internal Medicine

## 2017-06-07 ENCOUNTER — Encounter: Payer: Self-pay | Admitting: Internal Medicine

## 2017-06-07 VITALS — BP 118/78 | HR 58 | Temp 97.7°F | Wt 101.2 lb

## 2017-06-07 DIAGNOSIS — I10 Essential (primary) hypertension: Secondary | ICD-10-CM

## 2017-06-07 DIAGNOSIS — I6523 Occlusion and stenosis of bilateral carotid arteries: Secondary | ICD-10-CM | POA: Diagnosis not present

## 2017-06-07 DIAGNOSIS — H6121 Impacted cerumen, right ear: Secondary | ICD-10-CM | POA: Diagnosis not present

## 2017-06-07 DIAGNOSIS — E039 Hypothyroidism, unspecified: Secondary | ICD-10-CM

## 2017-06-07 DIAGNOSIS — M542 Cervicalgia: Secondary | ICD-10-CM

## 2017-06-07 MED ORDER — TRAMADOL HCL 50 MG PO TABS: 50.0000 mg | ORAL_TABLET | Freq: Four times a day (QID) | ORAL | 0 refills | Status: DC | PRN

## 2017-06-07 NOTE — Patient Instructions (Addendum)
Cervical Strain and Sprain Rehab Ask your health care provider which exercises are safe for you. Do exercises exactly as told by your health care provider and adjust them as directed. It is normal to feel mild stretching, pulling, tightness, or discomfort as you do these exercises, but you should stop right away if you feel sudden pain or your pain gets worse.Do not begin these exercises until told by your health care provider. Stretching and range of motion exercises These exercises warm up your muscles and joints and improve the movement and flexibility of your neck. These exercises also help to relieve pain, numbness, and tingling. Exercise A: Cervical side bend  1. Using good posture, sit on a stable chair or stand up. 2. Without moving your shoulders, slowly tilt your left / right ear to your shoulder until you feel a stretch in your neck muscles. You should be looking straight ahead. 3. Hold for __________ seconds. 4. Repeat with the other side of your neck. Repeat __________ times. Complete this exercise __________ times a day. Exercise B: Cervical rotation  1. Using good posture, sit on a stable chair or stand up. 2. Slowly turn your head to the side as if you are looking over your left / right shoulder. ? Keep your eyes level with the ground. ? Stop when you feel a stretch along the side and the back of your neck. 3. Hold for __________ seconds. 4. Repeat this by turning to your other side. Repeat __________ times. Complete this exercise __________ times a day. Exercise C: Thoracic extension and pectoral stretch 1. Roll a towel or a small blanket so it is about 4 inches (10 cm) in diameter. 2. Lie down on your back on a firm surface. 3. Put the towel lengthwise, under your spine in the middle of your back. It should not be not under your shoulder blades. The towel should line up with your spine from your middle back to your lower back. 4. Put your hands behind your head and let your  elbows fall out to your sides. 5. Hold for __________ seconds. Repeat __________ times. Complete this exercise __________ times a day. Strengthening exercises These exercises build strength and endurance in your neck. Endurance is the ability to use your muscles for a long time, even after your muscles get tired. Exercise D: Upper cervical flexion, isometric 1. Lie on your back with a thin pillow behind your head and a small rolled-up towel under your neck. 2. Gently tuck your chin toward your chest and nod your head down to look toward your feet. Do not lift your head off the pillow. 3. Hold for __________ seconds. 4. Release the tension slowly. Relax your neck muscles completely before you repeat this exercise. Repeat __________ times. Complete this exercise __________ times a day. Exercise E: Cervical extension, isometric  1. Stand about 6 inches (15 cm) away from a wall, with your back facing the wall. 2. Place a soft object, about 6-8 inches (15-20 cm) in diameter, between the back of your head and the wall. A soft object could be a small pillow, a ball, or a folded towel. 3. Gently tilt your head back and press into the soft object. Keep your jaw and forehead relaxed. 4. Hold for __________ seconds. 5. Release the tension slowly. Relax your neck muscles completely before you repeat this exercise. Repeat __________ times. Complete this exercise __________ times a day. Posture and body mechanics  Body mechanics refers to the movements and positions of   your body while you do your daily activities. Posture is part of body mechanics. Good posture and healthy body mechanics can help to relieve stress in your body's tissues and joints. Good posture means that your spine is in its natural S-curve position (your spine is neutral), your shoulders are pulled back slightly, and your head is not tipped forward. The following are general guidelines for applying improved posture and body mechanics to  your everyday activities. Standing  When standing, keep your spine neutral and keep your feet about hip-width apart. Keep a slight bend in your knees. Your ears, shoulders, and hips should line up.  When you do a task in which you stand in one place for a long time, place one foot up on a stable object that is 2-4 inches (5-10 cm) high, such as a footstool. This helps keep your spine neutral. Sitting   When sitting, keep your spine neutral and your keep feet flat on the floor. Use a footrest, if necessary, and keep your thighs parallel to the floor. Avoid rounding your shoulders, and avoid tilting your head forward.  When working at a desk or a computer, keep your desk at a height where your hands are slightly lower than your elbows. Slide your chair under your desk so you are close enough to maintain good posture.  When working at a computer, place your monitor at a height where you are looking straight ahead and you do not have to tilt your head forward or downward to look at the screen. Resting When lying down and resting, avoid positions that are most painful for you. Try to support your neck in a neutral position. You can use a contour pillow or a small rolled-up towel. Your pillow should support your neck but not push on it. This information is not intended to replace advice given to you by your health care provider. Make sure you discuss any questions you have with your health care provider. Document Released: 11/19/2005 Document Revised: 07/26/2016 Document Reviewed: 10/26/2015 Elsevier Interactive Patient Education  2018 [employer].  Take 400-600 mg of ibuprofen ( Advil, Motrin) with food every 4 to 6 hours as needed for pain relief or control of fever  Take pain medication as prescribed.  If needed  Use a soft cervical collar

## 2017-06-07 NOTE — Progress Notes (Signed)
Subjective:    Patient ID: [name], female    DOB: 1941-11-05, 76 y.o.   MRN: [id number]  HPI 76 year old patient who presents with a chief complaint of posterior neck pain of 2 months duration.  Head is aggravated by head turning.  She describes the pain is fairly constant but aggravated by movement.  There is been no trauma No radicular symptoms.  Patient has cervical x-rays performed about 6 years ago that revealed significant osteoarthritic changes. Recent laboratory studies revealed a suppressed TSH.  Her thyroid medication was adjusted about 3 weeks ago  Past Medical History:  Diagnosis Date  . Anemia   . Arthritis   . Blood transfusion without reported diagnosis   . Bronchitis    hx of  . CAD (coronary artery disease)    a. BMS to LAD 2008, multivessel disease // b. LHC 6/17 (ant-lat STEMI): LM ok, pLAD 70, mLAD 50, dLAD 60, oD2 70, oLCx 60, pRCA 30, dRCA 50, EF 35-45% with apical ballooning - no culprit for MI and no PCI performed - prob TakoTsubo CM  . Carotid artery disease ([place])    Carotid US 4/88: RICA 89-16%, LICA 9-45% >> follow-up 1 year  . Chronic back pain   . Diverticulitis   . Diverticulosis of colon (without mention of hemorrhage)   . Essential hypertension    Dr. Bluford Kaufmann [phone] at [facility]  . Hemorrhage of gastrointestinal tract, unspecified   . History of echocardiogram    a. Echo 05/14/16: Mild focal basal septal hypertrophy, EF 60-65%, normal wall motion, grade 2 diastolic dysfunction, mild TR  . History of non-ST elevation myocardial infarction (NSTEMI) 2008  . History of pneumonia   . Hyperlipidemia   . Hypothyroidism   . 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 Personal history of colonic polyps    Dr. Ardis Hughs, GI  . Raynaud's syndrome   . Takotsubo cardiomyopathy    a. Admitted 6/17 with anterolateral STEMI >> no culprit on LHC and EF 35-45% with apical ballooning // b. Echo prior to discharge with  EF 60-65% and normal wall motion //  c. Limited echo 7/17: EF 55-60%, no RWMA, no pericardial effusion  . Ulcer   . Urinary frequency      Social History   Social History  . Marital status: Married    Spouse name: N/A  . Number of children: N/A  . 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 Smoking status: Current Every Day Smoker    Packs/day: 0.75    Years: 40.00    Types: Cigarettes  . Smokeless tobacco: Never Used  . Alcohol use No  . Drug use: No  . Sexual activity: Not on file   Other Topics Concern  . Not on file   Social History Narrative  . No narrative on file    Past Surgical History:  Procedure Laterality Date  . ABDOMINAL HYSTERECTOMY    . ABDOMINAL HYSTERECTOMY  1975  . APPENDECTOMY  1955  . BREAST EXCISIONAL BIOPSY Right 1969   no visible scar  . CARDIAC CATHETERIZATION N/A 05/13/2016   Procedure: Left Heart Cath and Coronary Angiography;  Surgeon: Wellington Hampshire, MD;  Location: [facility];  Service: Cardiovascular;  Laterality: N/A;  . CHOLECYSTECTOMY    . CORONARY ANGIOPLASTY WITH STENT PLACEMENT     x1, 2007or 2008  . GASTROINTESTINAL STOMAL TUMOR  RESECTION    . LAPAROSCOPIC PARTIAL COLECTOMY  12/23/2012   Procedure: LAPAROSCOPIC PARTIAL COLECTOMY;  Surgeon: Stark Klein, MD;  Location: [facility];  Service: General;  Laterality: N/A;  . PARTIAL COLECTOMY  12/23/2012  . TUBAL LIGATION      Family History  Problem Relation Age of Onset  . Heart disease Mother        Verdie Drown  . Breast cancer Mother   . Lung cancer Father        SISTER, UNCLE  . Cerebral aneurysm Sister        UNCLE NEPHEW  . Colon cancer Neg Hx   . Esophageal cancer Neg Hx   . Stomach cancer Neg Hx   . Rectal cancer Neg Hx     No Known Allergies  Current Outpatient Prescriptions on File Prior to Visit  Medication Sig Dispense Refill  . aspirin EC 81 MG EC tablet Take 1 tablet (81 mg total) by mouth  once. (Patient taking differently: Take 81 mg by mouth daily. ) 30 tablet 11  . CYANOCOBALAMIN IJ Inject 1,000 mcg as directed every 30 (thirty) days.    Marland Kitchen ibuprofen (ADVIL,MOTRIN) 200 MG tablet Take 400 mg by mouth daily as needed (arthritis pain).    Marland Kitchen levothyroxine (SYNTHROID, LEVOTHROID) 75 MCG tablet Take 1 tablet (75 mcg total) by mouth daily before breakfast. 90 tablet 2  . metoprolol succinate (TOPROL-XL) 25 MG 24 hr tablet Take 1 tablet (25 mg total) by mouth daily. (Patient taking differently: Take 50 mg by mouth daily. ) 30 tablet 6  . nitroGLYCERIN (NITROSTAT) 0.4 MG SL tablet Place 1 tablet (0.4 mg total) under the tongue every 5 (five) minutes as needed for chest pain. 25 tablet 3   No current facility-administered medications on file prior to visit.     BP 118/78 (BP Location: Left Arm, Patient Position: Sitting, Cuff Size: Normal)   Pulse (!) 58   Temp 97.7 F (36.5 C) (Oral)   Wt 101 lb 3.2 oz (45.9 kg)   SpO2 95%   BMI 16.84 kg/m      Review of Systems  Constitutional: Negative.   HENT: Negative for congestion, dental problem, hearing loss, rhinorrhea, sinus pressure, sore throat and tinnitus.   Eyes: Negative for pain, discharge and visual disturbance.  Respiratory: Negative for cough and shortness of breath.   Cardiovascular: Negative for chest pain, palpitations and leg swelling.  Gastrointestinal: Negative for abdominal distention, abdominal pain, blood in stool, constipation, diarrhea, nausea and vomiting.  Genitourinary: Negative for difficulty urinating, dysuria, flank pain, frequency, hematuria, pelvic pain, urgency, vaginal bleeding, vaginal discharge and vaginal pain.  Musculoskeletal: Positive for neck pain and neck stiffness. Negative for arthralgias, gait problem and joint swelling.  Skin: Negative for rash.  Neurological: Negative for dizziness, syncope, speech difficulty, weakness, numbness and headaches.  Hematological: Negative for adenopathy.    Psychiatric/Behavioral: Negative for agitation, behavioral problems and dysphoric mood. The patient is not nervous/anxious.        Objective:   Physical Exam  Constitutional: She appears well-developed and well-nourished. No distress.  HENT:  Cerumen.  Right canal  Eyes: Pupils are equal, round, and reactive to light.  Neck:   Flexion normal, but pain with limitation of motion with extension Fairly intact range of motion with head turning to the left but impaired and painful with head turning to the right  Posterior neck musculature slightly tight and tense and tender to palpation  Assessment & Plan:   Neck pain Cervical osteoarthritis Cerumen impaction, right ear.  Canal irrigated until clear  We'll continue ibuprofen when necessary.  Patient will consider a soft cervical collar.  Heat therapy and gentle massage therapy recommended Will treat symptomatically with tramadol  Nyoka Cowden

## 2017-06-21 ENCOUNTER — Other Ambulatory Visit: Payer: Self-pay

## 2017-06-25 ENCOUNTER — Encounter: Payer: Self-pay | Admitting: Internal Medicine

## 2017-06-25 ENCOUNTER — Ambulatory Visit (INDEPENDENT_AMBULATORY_CARE_PROVIDER_SITE_OTHER): Payer: Medicare Other | Admitting: Internal Medicine

## 2017-06-25 VITALS — BP 130/82 | HR 78 | Temp 98.2°F | Wt 100.2 lb

## 2017-06-25 DIAGNOSIS — I2510 Atherosclerotic heart disease of native coronary artery without angina pectoris: Secondary | ICD-10-CM

## 2017-06-25 DIAGNOSIS — I10 Essential (primary) hypertension: Secondary | ICD-10-CM

## 2017-06-25 DIAGNOSIS — I6523 Occlusion and stenosis of bilateral carotid arteries: Secondary | ICD-10-CM | POA: Diagnosis not present

## 2017-06-25 DIAGNOSIS — E785 Hyperlipidemia, unspecified: Secondary | ICD-10-CM | POA: Diagnosis not present

## 2017-06-25 DIAGNOSIS — Z720 Tobacco use: Secondary | ICD-10-CM | POA: Diagnosis not present

## 2017-06-25 DIAGNOSIS — E039 Hypothyroidism, unspecified: Secondary | ICD-10-CM

## 2017-06-25 DIAGNOSIS — L723 Sebaceous cyst: Secondary | ICD-10-CM

## 2017-06-25 NOTE — Patient Instructions (Signed)
Call or return to clinic prn if these symptoms worsen or fail to improve as anticipated.  Return in 3 months for follow-up  

## 2017-06-25 NOTE — Progress Notes (Signed)
Subjective:    Patient ID: [name], female    DOB: 1941/11/13, 76 y.o.   MRN: [id number]  HPI  76 year old patient who has essential hypertension.  She is seen today accompanied by her husband with a complaint of a nodule involving the left lateral chest wall that has been enlarged slightly over the past several months.  The nodule is not painful nor has it become erythematous. She has hypothyroidism.  She states that she has been compliant with her medications. In general doing well  Past Medical History:  Diagnosis Date  . Anemia   . Arthritis   . Blood transfusion without reported diagnosis   . Bronchitis    hx of  . CAD (coronary artery disease)    a. BMS to LAD 2008, multivessel disease // b. LHC 6/17 (ant-lat STEMI): LM ok, pLAD 70, mLAD 50, dLAD 60, oD2 70, oLCx 60, pRCA 30, dRCA 50, EF 35-45% with apical ballooning - no culprit for MI and no PCI performed - prob TakoTsubo CM  . Carotid artery disease ([place])    Carotid US 6/38: RICA 93-73%, LICA 4-28% >> follow-up 1 year  . Chronic back pain   . Diverticulitis   . Diverticulosis of colon (without mention of hemorrhage)   . Essential hypertension    Dr. Bluford Kaufmann [phone] at [place]  . Hemorrhage of gastrointestinal tract, unspecified   . History of echocardiogram    a. Echo 05/14/16: Mild focal basal septal hypertrophy, EF 60-65%, normal wall motion, grade 2 diastolic dysfunction, mild TR  . History of non-ST elevation myocardial infarction (NSTEMI) 2008  . History of pneumonia   . Hyperlipidemia   . Hypothyroidism   . 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 Personal history of colonic polyps    Dr. Ardis Hughs, GI  . Raynaud's syndrome   . Takotsubo cardiomyopathy    a. Admitted 6/17 with anterolateral STEMI >> no culprit on LHC and EF 35-45% with apical ballooning // b. Echo prior to discharge with EF 60-65% and normal wall motion //  c. Limited echo 7/17: EF 55-60%, no  RWMA, no pericardial effusion  . Ulcer   . Urinary frequency      Social History   Social History  . Marital status: Married    Spouse name: N/A  . Number of children: N/A  . 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 Smoking status: Current Every Day Smoker    Packs/day: 0.75    Years: 40.00    Types: Cigarettes  . Smokeless tobacco: Never Used  . Alcohol use No  . Drug use: No  . Sexual activity: Not on file   Other Topics Concern  . Not on file   Social History Narrative  . No narrative on file    Past Surgical History:  Procedure Laterality Date  . ABDOMINAL HYSTERECTOMY    . ABDOMINAL HYSTERECTOMY  1975  . APPENDECTOMY  1955  . BREAST EXCISIONAL BIOPSY Right 1969   no visible scar  . CARDIAC CATHETERIZATION N/A 05/13/2016   Procedure: Left Heart Cath and Coronary Angiography;  Surgeon: Wellington Hampshire, MD;  Location: [facility];  Service: Cardiovascular;  Laterality: N/A;  . CHOLECYSTECTOMY    . CORONARY ANGIOPLASTY WITH STENT PLACEMENT     x1, 2007or 2008  . GASTROINTESTINAL STOMAL TUMOR RESECTION    . LAPAROSCOPIC PARTIAL COLECTOMY  12/23/2012   Procedure: LAPAROSCOPIC PARTIAL COLECTOMY;  Surgeon: Stark Klein, MD;  Location: [facility];  Service: General;  Laterality: N/A;  . PARTIAL COLECTOMY  12/23/2012  . TUBAL LIGATION      Family History  Problem Relation Age of Onset  . Heart disease Mother        Verdie Drown  . Breast cancer Mother   . Lung cancer Father        SISTER, UNCLE  . Cerebral aneurysm Sister        UNCLE NEPHEW  . Colon cancer Neg Hx   . Esophageal cancer Neg Hx   . Stomach cancer Neg Hx   . Rectal cancer Neg Hx     No Known Allergies  Current Outpatient Prescriptions on File Prior to Visit  Medication Sig Dispense Refill  . aspirin EC 81 MG EC tablet Take 1 tablet (81 mg total) by mouth once. (Patient taking differently: Take 81 mg by mouth daily. ) 30 tablet 11    . CYANOCOBALAMIN IJ Inject 1,000 mcg as directed every 30 (thirty) days.    Marland Kitchen ibuprofen (ADVIL,MOTRIN) 200 MG tablet Take 400 mg by mouth daily as needed (arthritis pain).    Marland Kitchen levothyroxine (SYNTHROID, LEVOTHROID) 75 MCG tablet Take 1 tablet (75 mcg total) by mouth daily before breakfast. 90 tablet 2  . metoprolol succinate (TOPROL-XL) 25 MG 24 hr tablet Take 1 tablet (25 mg total) by mouth daily. (Patient taking differently: Take 50 mg by mouth daily. ) 30 tablet 6  . nitroGLYCERIN (NITROSTAT) 0.4 MG SL tablet Place 1 tablet (0.4 mg total) under the tongue every 5 (five) minutes as needed for chest pain. 25 tablet 3  . traMADol (ULTRAM) 50 MG tablet Take 1 tablet (50 mg total) by mouth every 6 (six) hours as needed. 30 tablet 0   No current facility-administered medications on file prior to visit.     BP 138/90 (BP Location: Left Arm, Patient Position: Sitting, Cuff Size: Normal)   Pulse 78   Temp 98.2 F (36.8 C) (Oral)   Wt 100 lb 3.2 oz (45.5 kg)   SpO2 95%   BMI 16.67 kg/m     Review of Systems  HENT: Negative for congestion, dental problem, hearing loss, rhinorrhea, sinus pressure, sore throat and tinnitus.   Eyes: Negative for pain, discharge and visual disturbance.  Respiratory: Negative for cough and shortness of breath.   Cardiovascular: Negative for chest pain, palpitations and leg swelling.  Gastrointestinal: Negative for abdominal distention, abdominal pain, blood in stool, constipation, diarrhea, nausea and vomiting.  Genitourinary: Negative for difficulty urinating, dysuria, flank pain, frequency, hematuria, pelvic pain, urgency, vaginal bleeding, vaginal discharge and vaginal pain.  Musculoskeletal: Negative for arthralgias, gait problem and joint swelling.  Skin: Negative for rash.  Neurological: Positive for weakness. Negative for dizziness, syncope, speech difficulty, numbness and headaches.  Hematological: Negative for adenopathy.  Psychiatric/Behavioral:  Negative for agitation, behavioral problems and dysphoric mood. The patient is not nervous/anxious.        Objective:   Physical Exam  Constitutional: She appears well-developed and well-nourished. No distress.  Blood pressure 130/80  Skin:  1 cm freely movable nodule over the left anterolateral chest wall area          Assessment & Plan:   Probable sebaceous cyst ( versus small lipoma).  Patient reassured.  Will observe.  Reassess at the time of her next visit Hypertension Hypothyroidism  (Check follow-up TSH. Lab Results  Component Value Date  TSH 0.06 (L) 05/16/2017    Follow-up 3 months  [id number],PETER Pilar Plate

## 2017-06-26 LAB — TSH: TSH: 1.48 u[IU]/mL (ref 0.35–4.50)

## 2017-07-04 ENCOUNTER — Telehealth: Payer: Self-pay | Admitting: Internal Medicine

## 2017-07-04 NOTE — Telephone Encounter (Signed)
Please advise 

## 2017-07-04 NOTE — Telephone Encounter (Signed)
Pt calling for lab results from 06/25/17 °

## 2017-07-05 ENCOUNTER — Ambulatory Visit: Payer: Medicare Other | Admitting: Gastroenterology

## 2017-07-05 NOTE — Telephone Encounter (Signed)
Notify patient that TSH was normal and to continue present therapy  Notify her husband, Juanda Crumble,  The laboratory studies revealed a cholesterol of 242 and to resume atorvastatin once daily Hemoglobin A1c elevated at 8.0, but much improved compared to his last study.  Encourage compliance with all his insulin  Renal studies were stable

## 2017-07-05 NOTE — Telephone Encounter (Signed)
Spoke with patient who verbalized understanding.

## 2017-07-09 ENCOUNTER — Ambulatory Visit (INDEPENDENT_AMBULATORY_CARE_PROVIDER_SITE_OTHER): Payer: Medicare Other | Admitting: Internal Medicine

## 2017-07-09 ENCOUNTER — Encounter: Payer: Self-pay | Admitting: Internal Medicine

## 2017-07-09 VITALS — BP 102/70 | HR 82 | Temp 98.3°F | Ht 65.0 in | Wt 100.2 lb

## 2017-07-09 DIAGNOSIS — I10 Essential (primary) hypertension: Secondary | ICD-10-CM | POA: Diagnosis not present

## 2017-07-09 DIAGNOSIS — E039 Hypothyroidism, unspecified: Secondary | ICD-10-CM

## 2017-07-09 DIAGNOSIS — J439 Emphysema, unspecified: Secondary | ICD-10-CM | POA: Insufficient documentation

## 2017-07-09 DIAGNOSIS — Z720 Tobacco use: Secondary | ICD-10-CM | POA: Diagnosis not present

## 2017-07-09 DIAGNOSIS — J432 Centrilobular emphysema: Secondary | ICD-10-CM

## 2017-07-09 DIAGNOSIS — I2510 Atherosclerotic heart disease of native coronary artery without angina pectoris: Secondary | ICD-10-CM | POA: Diagnosis not present

## 2017-07-09 DIAGNOSIS — I6523 Occlusion and stenosis of bilateral carotid arteries: Secondary | ICD-10-CM

## 2017-07-09 DIAGNOSIS — I5181 Takotsubo syndrome: Secondary | ICD-10-CM

## 2017-07-09 MED ORDER — ALBUTEROL SULFATE HFA 108 (90 BASE) MCG/ACT IN AERS: 2.0000 | INHALATION_SPRAY | Freq: Four times a day (QID) | RESPIRATORY_TRACT | 0 refills | Status: AC | PRN

## 2017-07-09 NOTE — Patient Instructions (Signed)
Discontinue all tobacco products  Advair one puff twice daily

## 2017-07-09 NOTE — Progress Notes (Signed)
Subjective:    Patient ID: [name], female    DOB: [birth year], 76 y.o.   MRN: [id number]  HPI 76 year old patient who presents with a chief complaint of shortness of breath.  This occurred yesterday morning that occurred after being up all night (.  Usual sleep-wake pattern).  She was accompanied by her son who observed her throughout the day while asleep.  O2 saturations ranged from 89% to 94% yesterday with the in-home testing.  She denies any chest pain. She does have a history of coronary artery disease and was hospitalized approximately 18 months ago for a stress cardio myopathy.  This was associated with ST elevated MI with positive enzymes.  Cardiac catheterization revealed no culprit lesions and this was felt most related to stress cardiomyopathy.  Heart catheterization did reveal diffuse multivessel disease but none critical. The patient did have a chest CT lung cancer screening test performed 2 months ago that did reveal emphysematous changes.  The patient does continue to smoke  Past Medical History:  Diagnosis Date  . Anemia   . Arthritis   . Blood transfusion without reported diagnosis   . Bronchitis    hx of  . CAD (coronary artery disease)    a. BMS to LAD 2008, multivessel disease // b. LHC 6/17 (ant-lat STEMI): LM ok, pLAD 70, mLAD 50, dLAD 60, oD2 70, oLCx 60, pRCA 30, dRCA 50, EF 35-45% with apical ballooning - no culprit for MI and no PCI performed - prob TakoTsubo CM  . Carotid artery disease ([name])    Carotid US 4/23: RICA 53-61%, LICA 4-43% >> follow-up 1 year  . Chronic back pain   . Diverticulitis   . Diverticulosis of colon (without mention of hemorrhage)   . Essential hypertension    Dr. Bluford Kaufmann [phone] at [facility] [place]  . Hemorrhage of gastrointestinal tract, unspecified   . History of echocardiogram    a. Echo 05/14/16: Mild focal basal septal hypertrophy, EF 60-65%, normal wall motion, grade 2 diastolic dysfunction, mild TR  . History of  non-ST elevation myocardial infarction (NSTEMI) 2008  . History of pneumonia   . Hyperlipidemia   . Hypothyroidism   . 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 Personal history of colonic polyps    Dr. Ardis Hughs, GI  . Raynaud's syndrome   . Takotsubo cardiomyopathy    a. Admitted 6/17 with anterolateral STEMI >> no culprit on LHC and EF 35-45% with apical ballooning // b. Echo prior to discharge with EF 60-65% and normal wall motion //  c. Limited echo 7/17: EF 55-60%, no RWMA, no pericardial effusion  . Ulcer   . Urinary frequency      Social History   Social History  . Marital status: Married    Spouse name: N/A  . Number of children: N/A  . 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 Smoking status: Current Every Day Smoker    Packs/day: 0.75    Years: 40.00    Types: Cigarettes  . Smokeless tobacco: Never Used  . Alcohol use No  . Drug use: No  . Sexual activity: Not on file   Other Topics Concern  . Not on file   Social History Narrative  . No narrative on file    Past Surgical History:  Procedure Laterality Date  . ABDOMINAL HYSTERECTOMY    . ABDOMINAL HYSTERECTOMY  1975  .  APPENDECTOMY  1955  . BREAST EXCISIONAL BIOPSY Right 1969   no visible scar  . CARDIAC CATHETERIZATION N/A 05/13/2016   Procedure: Left Heart Cath and Coronary Angiography;  Surgeon: Wellington Hampshire, MD;  Location: [facility];  Service: Cardiovascular;  Laterality: N/A;  . CHOLECYSTECTOMY    . CORONARY ANGIOPLASTY WITH STENT PLACEMENT     x1, 2007or 2008  . GASTROINTESTINAL STOMAL TUMOR RESECTION    . LAPAROSCOPIC PARTIAL COLECTOMY  12/23/2012   Procedure: LAPAROSCOPIC PARTIAL COLECTOMY;  Surgeon: Stark Klein, MD;  Location: [facility];  Service: General;  Laterality: N/A;  . PARTIAL COLECTOMY  12/23/2012  . TUBAL LIGATION      Family History  Problem Relation Age of Onset  . Heart disease Mother         Verdie Drown  . Breast cancer Mother   . Lung cancer Father        SISTER, UNCLE  . Cerebral aneurysm Sister        UNCLE NEPHEW  . Colon cancer Neg Hx   . Esophageal cancer Neg Hx   . Stomach cancer Neg Hx   . Rectal cancer Neg Hx     No Known Allergies  Current Outpatient Prescriptions on File Prior to Visit  Medication Sig Dispense Refill  . aspirin EC 81 MG EC tablet Take 1 tablet (81 mg total) by mouth once. (Patient taking differently: Take 81 mg by mouth daily. ) 30 tablet 11  . CYANOCOBALAMIN IJ Inject 1,000 mcg as directed every 30 (thirty) days.    Marland Kitchen ibuprofen (ADVIL,MOTRIN) 200 MG tablet Take 400 mg by mouth daily as needed (arthritis pain).    Marland Kitchen levothyroxine (SYNTHROID, LEVOTHROID) 75 MCG tablet Take 1 tablet (75 mcg total) by mouth daily before breakfast. 90 tablet 2  . metoprolol succinate (TOPROL-XL) 25 MG 24 hr tablet Take 1 tablet (25 mg total) by mouth daily. (Patient taking differently: Take 50 mg by mouth daily. ) 30 tablet 6  . nitroGLYCERIN (NITROSTAT) 0.4 MG SL tablet Place 1 tablet (0.4 mg total) under the tongue every 5 (five) minutes as needed for chest pain. 25 tablet 3  . traMADol (ULTRAM) 50 MG tablet Take 1 tablet (50 mg total) by mouth every 6 (six) hours as needed. 30 tablet 0   No current facility-administered medications on file prior to visit.     BP 102/70 (BP Location: Left Arm, Patient Position: Sitting, Cuff Size: Normal)   Pulse 82   Temp 98.3 F (36.8 C) (Oral)   Ht 5\' 5"  (1.651 m)   Wt 100 lb 3.2 oz (45.5 kg)   SpO2 95%   BMI 16.67 kg/m      Review of Systems  Constitutional: Negative.   HENT: Negative for congestion, dental problem, hearing loss, rhinorrhea, sinus pressure, sore throat and tinnitus.   Eyes: Negative for pain, discharge and visual disturbance.  Respiratory: Positive for shortness of breath. Negative for cough, chest tightness and wheezing.   Cardiovascular: Negative for chest pain, palpitations and leg  swelling.  Gastrointestinal: Negative for abdominal distention, abdominal pain, blood in stool, constipation, diarrhea, nausea and vomiting.  Genitourinary: Negative for difficulty urinating, dysuria, flank pain, frequency, hematuria, pelvic pain, urgency, vaginal bleeding, vaginal discharge and vaginal pain.  Musculoskeletal: Negative for arthralgias, gait problem and joint swelling.  Skin: Negative for rash.  Neurological: Negative for dizziness, syncope, speech difficulty, weakness, numbness and headaches.  Hematological: Negative for adenopathy.  Psychiatric/Behavioral: Negative for agitation, behavioral problems and dysphoric mood.  The patient is not nervous/anxious.        Objective:   Physical Exam  Constitutional: She is oriented to person, place, and time. She appears well-developed and well-nourished.  Blood pressure low normal Afebrile Oxygen saturation 95% Pulse 78  HENT:  Head: Normocephalic.  Right Ear: External ear normal.  Left Ear: External ear normal.  Mouth/Throat: Oropharynx is clear and moist.  Eyes: Pupils are equal, round, and reactive to light. Conjunctivae and EOM are normal.  Neck: Normal range of motion. Neck supple. No thyromegaly present.  Cardiovascular: Normal rate, regular rhythm, normal heart sounds and intact distal pulses.   Pulmonary/Chest: Effort normal and breath sounds normal. No respiratory distress. She has no wheezes. She has no rales.  Abdominal: Soft. Bowel sounds are normal. She exhibits no mass. There is no tenderness.  Musculoskeletal: Normal range of motion.  Lymphadenopathy:    She has no cervical adenopathy.  Neurological: She is alert and oriented to person, place, and time.  Skin: Skin is warm and dry. No rash noted.  Psychiatric: She has a normal mood and affect. Her behavior is normal.          Assessment & Plan:   COPD Ongoing tobacco use Hypothyroidism  O2 saturation at rest 95% with pulse 78.  After a brief brisk  walk up and down the hall, Pulse increased to 98 and maintain same.  Oxygen saturation of 95%  Total smoking cessation encouraged Samples of Advair, dispensed New prescription for albuterol dispensed to use when necessary  Follow-up in 3 months or as needed  [employer]

## 2017-07-30 DIAGNOSIS — H903 Sensorineural hearing loss, bilateral: Secondary | ICD-10-CM | POA: Diagnosis not present

## 2017-07-30 DIAGNOSIS — H6123 Impacted cerumen, bilateral: Secondary | ICD-10-CM | POA: Diagnosis not present

## 2017-07-30 DIAGNOSIS — H61393 Other acquired stenosis of external ear canal, bilateral: Secondary | ICD-10-CM | POA: Diagnosis not present

## 2017-08-01 ENCOUNTER — Ambulatory Visit (INDEPENDENT_AMBULATORY_CARE_PROVIDER_SITE_OTHER): Payer: Medicare Other | Admitting: Cardiovascular Disease

## 2017-08-01 ENCOUNTER — Encounter: Payer: Self-pay | Admitting: Cardiovascular Disease

## 2017-08-01 VITALS — BP 148/80 | HR 63 | Ht 65.0 in | Wt 102.0 lb

## 2017-08-01 DIAGNOSIS — R200 Anesthesia of skin: Secondary | ICD-10-CM | POA: Diagnosis not present

## 2017-08-01 DIAGNOSIS — R202 Paresthesia of skin: Secondary | ICD-10-CM | POA: Diagnosis not present

## 2017-08-01 DIAGNOSIS — I6523 Occlusion and stenosis of bilateral carotid arteries: Secondary | ICD-10-CM

## 2017-08-01 LAB — BASIC METABOLIC PANEL
BUN/Creatinine Ratio: 20 (ref 12–28)
BUN: 18 mg/dL (ref 8–27)
CALCIUM: 9.8 mg/dL (ref 8.7–10.3)
CHLORIDE: 99 mmol/L (ref 96–106)
CO2: 25 mmol/L (ref 20–29)
CREATININE: 0.92 mg/dL (ref 0.57–1.00)
GFR calc non Af Amer: 61 mL/min/{1.73_m2} (ref >59)
GFR, EST AFRICAN AMERICAN: 70 mL/min/{1.73_m2} (ref >59)
GLUCOSE: 90 mg/dL (ref 65–99)
POTASSIUM: 5.1 mmol/L (ref 3.5–5.2)
SODIUM: 140 mmol/L (ref 134–144)

## 2017-08-01 NOTE — Progress Notes (Signed)
Cardiology Office Note Date:  08/02/2017   ID:  Corinthian, Kemler Mar 04, 1941, MRN [id number]  PCP:  Marletta Lor, MD  Cardiologist:  Sherren Mocha, MD    Chief Complaint  Patient presents with  . Numbness     History of Present Illness: [name] is a 76 y.o. female who presents for evaluation of right hand numbness. She called in concerned about stroke/TIA symptoms and was scheduled for an office visit today for further evaluation. She is here with her son today.  The patient has been followed for CAD with prior stenting and more recent history of acute Takotsubo syndrome.   She complains of numbness in the right arm, starting in the fingers and extending up to the mouth. All of this has been on the right side. She's had 2 episodes, both occurring at rest. Symptoms have primarily been in the 2nd and 3rd fingers. Symptom duration is 3 minutes. Also feels like the veins in her arm become distended when this occurs. No speech or vision abnormalities. No weakness or clumsiness. There are no precipitating factors. She denies any injury to the neck or arm. No chest pain or pressure. She is chronically short of breath with activity but this is unchanged. No palpitations.   Past Medical History:  Diagnosis Date  . Anemia   . Arthritis   . Blood transfusion without reported diagnosis   . Bronchitis    hx of  . CAD (coronary artery disease)    a. BMS to LAD 2008, multivessel disease // b. LHC 6/17 (ant-lat STEMI): LM ok, pLAD 70, mLAD 50, dLAD 60, oD2 70, oLCx 60, pRCA 30, dRCA 50, EF 35-45% with apical ballooning - no culprit for MI and no PCI performed - prob TakoTsubo CM  . Carotid artery disease ([place])    Carotid US 4/09: RICA 81-19%, LICA 1-47% >> follow-up 1 year  . Chronic back pain   . Diverticulitis   . Diverticulosis of colon (without mention of hemorrhage)   . Essential hypertension    Dr. Bluford Kaufmann [phone] at [facility]  . Hemorrhage of  gastrointestinal tract, unspecified   . History of echocardiogram    a. Echo 05/14/16: Mild focal basal septal hypertrophy, EF 60-65%, normal wall motion, grade 2 diastolic dysfunction, mild TR  . History of non-ST elevation myocardial infarction (NSTEMI) 2008  . History of pneumonia   . Hyperlipidemia   . Hypothyroidism   . 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 Personal history of colonic polyps    Dr. Ardis Hughs, GI  . Raynaud's syndrome   . Takotsubo cardiomyopathy    a. Admitted 6/17 with anterolateral STEMI >> no culprit on LHC and EF 35-45% with apical ballooning // b. Echo prior to discharge with EF 60-65% and normal wall motion //  c. Limited echo 7/17: EF 55-60%, no RWMA, no pericardial effusion  . Ulcer   . Urinary frequency     Past Surgical History:  Procedure Laterality Date  . ABDOMINAL HYSTERECTOMY    . ABDOMINAL HYSTERECTOMY  1975  . APPENDECTOMY  1955  . BREAST EXCISIONAL BIOPSY Right 1969   no visible scar  . CARDIAC CATHETERIZATION N/A 05/13/2016   Procedure: Left Heart Cath and Coronary Angiography;  Surgeon: Wellington Hampshire, MD;  Location: [facility];  Service: Cardiovascular;  Laterality: N/A;  . CHOLECYSTECTOMY    . CORONARY ANGIOPLASTY WITH STENT PLACEMENT     x1, 2007or 2008  .  GASTROINTESTINAL STOMAL TUMOR RESECTION    . LAPAROSCOPIC PARTIAL COLECTOMY  12/23/2012   Procedure: LAPAROSCOPIC PARTIAL COLECTOMY;  Surgeon: Stark Klein, MD;  Location: [facility];  Service: General;  Laterality: N/A;  . PARTIAL COLECTOMY  12/23/2012  . TUBAL LIGATION      Current Outpatient Prescriptions  Medication Sig Dispense Refill  . albuterol (PROVENTIL HFA;VENTOLIN HFA) 108 (90 Base) MCG/ACT inhaler Inhale 2 puffs into the lungs every 6 (six) hours as needed for wheezing or shortness of breath. 1 Inhaler 0  . aspirin EC 81 MG tablet Take 81 mg by mouth daily.    . CYANOCOBALAMIN IJ Inject 1,000 mcg as directed every 30 (thirty) days.      Marland Kitchen ibuprofen (ADVIL,MOTRIN) 200 MG tablet Take 400 mg by mouth daily as needed (arthritis pain).    Marland Kitchen levothyroxine (SYNTHROID, LEVOTHROID) 75 MCG tablet Take 1 tablet (75 mcg total) by mouth daily before breakfast. 90 tablet 2  . metoprolol succinate (TOPROL-XL) 25 MG 24 hr tablet Take 25 mg by mouth daily.    . nitroGLYCERIN (NITROSTAT) 0.4 MG SL tablet Place 1 tablet (0.4 mg total) under the tongue every 5 (five) minutes as needed for chest pain. 25 tablet 3  . traMADol (ULTRAM) 50 MG tablet Take 50 mg by mouth every 6 (six) hours as needed (pain).     No current facility-administered medications for this visit.     Allergies:   Patient has no known allergies.   Social History:  The patient  reports that she has been smoking Cigarettes.  She has a 30.00 pack-year smoking history. She has never used smokeless tobacco. She reports that she does not drink alcohol or use drugs.   Family History:  The patient's family history includes Breast cancer in her mother; Cerebral aneurysm in her sister; Heart disease in her mother; Lung cancer in her father.    ROS:  Please see the history of present illness.  Otherwise, review of systems is positive for fatigue.  All other systems are reviewed and negative.    PHYSICAL EXAM: VS:  BP (!) 148/80   Pulse 63   Ht 5\' 5"  (1.651 m)   Wt 102 lb (46.3 kg)   BMI 16.97 kg/m  , BMI Body mass index is 16.97 kg/m. GEN: thin elderly woman in no acute distress  HEENT: normal  Neck: no JVD, no masses. No carotid bruits Cardiac: RRR without murmur or gallop                Respiratory:  clear to auscultation bilaterally, normal work of breathing GI: soft, nontender, nondistended, + BS MS: no deformity or atrophy. Radial pulses 2+ = bilaterally. Brachial pulses 2+= bilaterally. No arm swelling. Prominent arm veins noted Ext: no pretibial edema Skin: warm and dry, no rash Neuro:  Strength and sensation are intact Psych: euthymic mood, full affect  EKG:   EKG is ordered today. The ekg ordered today shows NSR with LAD, pulmonary disease pattern. Age-indeterminate septal infarct  Recent Labs: 01/07/2017: ALT 10; Platelets 172.0 05/15/2017: Hemoglobin 13.6 06/25/2017: TSH 1.48 08/01/2017: BUN 18; Creatinine, Ser 0.92; Potassium 5.1; Sodium 140   Lipid Panel     Component Value Date/Time   CHOL 174 05/13/2016 1339   TRIG 91 05/13/2016 1339   HDL 57 05/13/2016 1339   CHOLHDL 3.1 05/13/2016 1339   VLDL 18 05/13/2016 1339   LDLCALC 99 05/13/2016 1339   LDLDIRECT 181.6 08/26/2013 1041      Wt Readings from Last  3 Encounters:  08/01/17 102 lb (46.3 kg)  07/09/17 100 lb 3.2 oz (45.5 kg)  06/25/17 100 lb 3.2 oz (45.5 kg)     Cardiac Studies Reviewed: Echo 06/08/2016: Study Conclusions  - Left ventricle: Abnormal septal motion The cavity size was   normal. Systolic function was normal. The estimated ejection   fraction was in the range of 55% to 60%. Wall motion was normal;   there were no regional wall motion abnormalities. Left   ventricular diastolic function parameters were normal. - Atrial septum: No defect or patent foramen ovale was identified.  ASSESSMENT AND PLAN: 1.  Right arm/face numbness, episodic: 2 episodes as outlined above. She has a hx of Raynaud's but denies any skin changes in her fingers, and with facial involvement this seems unlikely. Other considerations include TIA or cervical spine issue. She has multiple risk factors for TIA including known atherosclerosis and longstanding cigarette use. I've recommended an MRI of the brain. She continues on antiplatelet rx. Has refused statins. Will FU after MRI result available. If negative she will FU with her PCP for other evaluation.  2. CAD - no change. Continue current Rx. No angina.    Current medicines are reviewed with the patient today.  The patient does not have concerns regarding medicines.  Labs/ tests ordered today include:   Orders Placed This Encounter   Procedures  . MR Brain W Wo Contrast  . Basic Metabolic Panel (BMET)  . EKG 12-Lead    Disposition:   FU as scheduled  Signed, Sherren Mocha, MD  08/02/2017 6:35 AM    [facility] [address], [place], [place]  [address] Phone: [phone]; Fax: [phone]

## 2017-08-01 NOTE — Patient Instructions (Addendum)
Medication Instructions:  Your physician recommends that you continue on your current medications as directed. Please refer to the Current Medication list given to you today.   Labwork: Lab work to be done today--BMP  Testing/Procedures: MRI of brain to be done at [facility]: Your physician wants you to follow-up in: June 2019 You will receive a reminder letter in the mail two months in advance. If you don't receive a letter, please call our office to schedule the follow-up appointment.     Any Other Special Instructions Will Be Listed Below (If Applicable).     If you need a refill on your cardiac medications before your next appointment, please call your pharmacy.

## 2017-08-12 ENCOUNTER — Ambulatory Visit
Admission: RE | Admit: 2017-08-12 | Discharge: 2017-08-12 | Disposition: A | Discharge status: Home / Self Care | Payer: Medicare Other | Source: Ambulatory Visit | Attending: Cardiovascular Disease | Admitting: Cardiovascular Disease

## 2017-08-12 DIAGNOSIS — R200 Anesthesia of skin: Secondary | ICD-10-CM

## 2017-08-12 DIAGNOSIS — R202 Paresthesia of skin: Secondary | ICD-10-CM | POA: Diagnosis not present

## 2017-08-12 MED ORDER — GADOBENATE DIMEGLUMINE 529 MG/ML IV SOLN
10.0000 mL | Freq: Once | INTRAVENOUS | Status: AC | PRN
  Administered 2017-08-12: 10 mL via INTRAVENOUS

## 2017-08-14 ENCOUNTER — Telehealth: Payer: Self-pay

## 2017-08-14 DIAGNOSIS — E854 Organ-limited amyloidosis: Secondary | ICD-10-CM

## 2017-08-14 DIAGNOSIS — I680 Cerebral amyloid angiopathy: Principal | ICD-10-CM

## 2017-08-14 NOTE — Telephone Encounter (Signed)
-----   Message from Sherren Mocha, MD sent at 08/13/2017 11:54 PM EDT ----- No acute findings. No radiographic evidence of stroke. Chronic white matter changes noted. Findings of amyloid angiopathy noted. Will send result to PCP and refer to neurology - non-urgent referral. thanks

## 2017-08-14 NOTE — Telephone Encounter (Signed)
Informed patient of results and verbal understanding expressed.   Neurology referral placed for scheduling. Patient agrees with treatment plan.

## 2017-09-17 ENCOUNTER — Encounter: Payer: Self-pay | Admitting: Neurology

## 2017-09-17 ENCOUNTER — Ambulatory Visit (INDEPENDENT_AMBULATORY_CARE_PROVIDER_SITE_OTHER): Payer: Medicare Other | Admitting: Neurology

## 2017-09-17 VITALS — BP 140/90 | HR 71 | Ht 65.0 in | Wt 104.4 lb

## 2017-09-17 DIAGNOSIS — Z8669 Personal history of other diseases of the nervous system and sense organs: Secondary | ICD-10-CM

## 2017-09-17 DIAGNOSIS — F17200 Nicotine dependence, unspecified, uncomplicated: Secondary | ICD-10-CM

## 2017-09-17 DIAGNOSIS — I10 Essential (primary) hypertension: Secondary | ICD-10-CM | POA: Diagnosis not present

## 2017-09-17 DIAGNOSIS — I209 Angina pectoris, unspecified: Secondary | ICD-10-CM | POA: Diagnosis not present

## 2017-09-17 DIAGNOSIS — I680 Cerebral amyloid angiopathy: Secondary | ICD-10-CM | POA: Diagnosis not present

## 2017-09-17 DIAGNOSIS — I25119 Atherosclerotic heart disease of native coronary artery with unspecified angina pectoris: Secondary | ICD-10-CM | POA: Diagnosis not present

## 2017-09-17 DIAGNOSIS — I6523 Occlusion and stenosis of bilateral carotid arteries: Secondary | ICD-10-CM

## 2017-09-17 MED ORDER — LEVETIRACETAM 250 MG PO TABS: ORAL_TABLET | ORAL | 3 refills | Status: DC

## 2017-09-17 NOTE — Patient Instructions (Addendum)
-   you have the condition most consistent with cerebral amyloid angiopathy. Please continue ASA 81mg  for cardiac prevention. But I will not recommend any further blood thinners beyond ASA 81mg  unless absolutely indicated. - recommend to have blood pressure cuff at home and check BP regularly, BP goal < 140/90.  - will do EEG to rule out seizure - will initiate Keppra to help control the episodes. Will start with 250mg  daily at night for 7 days and then 250mg  twice a day for 7 days and then 375mg  twice a day after. - follow up with Dr. Burt Knack. - follow up with PCP. - quit smoking completely. - follow up in 3 months.

## 2017-09-18 DIAGNOSIS — Z8669 Personal history of other diseases of the nervous system and sense organs: Secondary | ICD-10-CM | POA: Insufficient documentation

## 2017-09-18 DIAGNOSIS — I25119 Atherosclerotic heart disease of native coronary artery with unspecified angina pectoris: Secondary | ICD-10-CM | POA: Insufficient documentation

## 2017-09-18 NOTE — Progress Notes (Signed)
NEUROLOGY CLINIC NEW PATIENT NOTE  NAME: [name] DOB: 01/10/1941 REFERRING PHYSICIAN: Sherren Mocha, MD  I saw [name] as a new consult in the neurovascular clinic today regarding  Chief Complaint  Patient presents with  . New Patient (Initial Visit)    Referral for Cerebral amyloid angiopathy from Dr. Talbot Grumbling, pt states right hand is numb. Pts son Nicole Kindred was at appt today  .  HPI: [name] is a 76 y.o. female with PMH of CAD s/p stent on ASA 61, HTN, left bell's palsy without residue, CHF, HLD, carotid stenosis, current smoker and hypothyroidism who presents as a new patient for episodes of right sided numbness and abnormal MRI findings. She is accompanied by her son.   Pt stated that she had total 3 episodes of right sided numbness starting about 6 weeks ago, the last one was one week ago. The right hand starting to clutch, drawing up, with numbness, which migrating to right arm, shoulder and then right face. No pain, no shaking jerking, no LOC. Lasting 10 min and resolved. No leg involvement. She went to see her cardiologist Dr. Burt Knack and had MRI done showed no acute stroke but numerous bilateral CMBs, L>R. She was referred here for further evaluation.   Pt has CAD s/p stent, was on ASA and plavix in the past, but now on ASA 81, followed with cardiology and stable. HTN but did not check BP at home and today in clinic 140/90. Once a while takes advil for pain. Has bell's palsy 20 years ago on the left, no residue. Has hypothyroidism on synthroid. Son did admit that pt has mild memory issue and confusion for the last 1-2 years.    Past Medical History:  Diagnosis Date  . Anemia   . Arthritis   . Blood transfusion without reported diagnosis   . Bronchitis    hx of  . CAD (coronary artery disease)    a. BMS to LAD 2008, multivessel disease // b. LHC 6/17 (ant-lat STEMI): LM ok, pLAD 70, mLAD 50, dLAD 60, oD2 70, oLCx 60, pRCA 30, dRCA 50, EF 35-45% with  apical ballooning - no culprit for MI and no PCI performed - prob TakoTsubo CM  . Carotid artery disease ([place])    Carotid US 2/83: RICA 15-17%, LICA 6-16% >> follow-up 1 year  . Chronic back pain   . Diverticulitis   . Diverticulosis of colon (without mention of hemorrhage)   . Essential hypertension    Dr. Bluford Kaufmann [phone] at [facility]  . Hemorrhage of gastrointestinal tract, unspecified   . History of echocardiogram    a. Echo 05/14/16: Mild focal basal septal hypertrophy, EF 60-65%, normal wall motion, grade 2 diastolic dysfunction, mild TR  . History of non-ST elevation myocardial infarction (NSTEMI) 2008  . History of pneumonia   . Hyperlipidemia   . Hypothyroidism   . 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 Personal history of colonic polyps    Dr. Ardis Hughs, GI  . Raynaud's syndrome   . Takotsubo cardiomyopathy    a. Admitted 6/17 with anterolateral STEMI >> no culprit on LHC and EF 35-45% with apical ballooning // b. Echo prior to discharge with EF 60-65% and normal wall motion //  c. Limited echo 7/17: EF 55-60%, no RWMA, no pericardial effusion  . Ulcer   . Urinary frequency    Past Surgical History:  Procedure Laterality Date  . ABDOMINAL HYSTERECTOMY    .  ABDOMINAL HYSTERECTOMY  1975  . APPENDECTOMY  1955  . BREAST EXCISIONAL BIOPSY Right 1969   no visible scar  . CARDIAC CATHETERIZATION N/A 05/13/2016   Procedure: Left Heart Cath and Coronary Angiography;  Surgeon: Wellington Hampshire, MD;  Location: [facility];  Service: Cardiovascular;  Laterality: N/A;  . CHOLECYSTECTOMY    . CORONARY ANGIOPLASTY WITH STENT PLACEMENT     x1, 2007or 2008  . GASTROINTESTINAL STOMAL TUMOR RESECTION    . LAPAROSCOPIC PARTIAL COLECTOMY  12/23/2012   Procedure: LAPAROSCOPIC PARTIAL COLECTOMY;  Surgeon: Stark Klein, MD;  Location: [facility];  Service: General;  Laterality: N/A;  . PARTIAL COLECTOMY  12/23/2012  . TUBAL LIGATION     Family  History  Problem Relation Age of Onset  . Heart disease Mother        Verdie Drown  . Breast cancer Mother   . Lung cancer Father        SISTER, UNCLE  . Cerebral aneurysm Sister        UNCLE NEPHEW  . Colon cancer Neg Hx   . Esophageal cancer Neg Hx   . Stomach cancer Neg Hx   . Rectal cancer Neg Hx    Current Outpatient Prescriptions  Medication Sig Dispense Refill  . albuterol (PROVENTIL HFA;VENTOLIN HFA) 108 (90 Base) MCG/ACT inhaler Inhale 2 puffs into the lungs every 6 (six) hours as needed for wheezing or shortness of breath. 1 Inhaler 0  . aspirin EC 81 MG tablet Take 81 mg by mouth daily.    . CYANOCOBALAMIN IJ Inject 1,000 mcg as directed every 30 (thirty) days.    Marland Kitchen ibuprofen (ADVIL,MOTRIN) 200 MG tablet Take 400 mg by mouth daily as needed (arthritis pain).    Marland Kitchen levothyroxine (SYNTHROID, LEVOTHROID) 75 MCG tablet Take 1 tablet (75 mcg total) by mouth daily before breakfast. 90 tablet 2  . metoprolol succinate (TOPROL-XL) 25 MG 24 hr tablet Take 25 mg by mouth daily.    . nitroGLYCERIN (NITROSTAT) 0.4 MG SL tablet Place 1 tablet (0.4 mg total) under the tongue every 5 (five) minutes as needed for chest pain. 25 tablet 3  . nitroGLYCERIN (NITROSTAT) 0.4 MG SL tablet Place under the tongue.    . traMADol (ULTRAM) 50 MG tablet Take 50 mg by mouth every 6 (six) hours as needed (pain).    Marland Kitchen levETIRAcetam (KEPPRA) 250 MG tablet Take one tab nightly for 7 days and then one tab bid for 7 days and then 1.5 tab bid after 90 tablet 3   No current facility-administered medications for this visit.    No Known Allergies Social History   Social History  . Marital status: Married    Spouse name: N/A  . Number of children: N/A  . 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 Smoking status: Current Every Day Smoker    Packs/day: 0.75    Years: 1.00    Types: Cigarettes  . Smokeless tobacco: Never Used  . Alcohol use No  .  Drug use: No  . Sexual activity: Not on file   Other Topics Concern  . Not on file   Social History Narrative  . No narrative on file    Review of Systems Full 14 system review of systems performed and notable only for those listed, all others are neg:  Constitutional:  Fatigue Cardiovascular:  Ear/Nose/Throat:   Skin:  Eyes:   Respiratory:  Cough Gastroitestinal:  Diarrhea Genitourinary: Incontinence Hematology/Lymphatic:  Easy bruising Endocrine:  Musculoskeletal:  Joint pain, aching muscles Allergy/Immunology:   Neurological:  Numbness Psychiatric: Not enough sleep, decreased energy, change in appetite Sleep: Sleepiness, snoring   Physical Exam  Vitals:   09/17/17 1136  BP: 140/90  Pulse: 71    General - Well nourished, well developed, in no apparent distress.  Ophthalmologic - Sharp disc margins OU.  Cardiovascular - Regular rate and rhythm with no murmur.   Neck - supple, no nuchal rigidity .  Mental Status -  Level of arousal and orientation to time, place, and person were intact. Language including expression, naming, repetition, comprehension, reading, and writing was assessed and found intact. Fund of Knowledge was assessed and was intact.  Cranial Nerves II - XII - II - Visual field intact OU. III, IV, VI - Extraocular movements intact. V - Facial sensation intact bilaterally. VII - Facial movement intact bilaterally. VIII - Hearing & vestibular intact bilaterally. X - Palate elevates symmetrically. XI - Chin turning & shoulder shrug intact bilaterally. XII - Tongue protrusion intact.  Motor Strength - The patient's strength was normal in all extremities and pronator drift was absent.  Bulk was normal and fasciculations were absent.   Motor Tone - Muscle tone was assessed at the neck and appendages and was normal.  Reflexes - The patient's reflexes were normal in all extremities and she had no pathological reflexes.  Sensory - Light touch,  temperature/pinprick, vibration and proprioception, and Romberg testing were assessed and were normal.    Coordination - The patient had normal movements in the hands and feet with no ataxia or dysmetria.  Tremor was absent.  Gait and Station - The patient's transfers, posture, gait, station, and turns were observed as normal.   Imaging  I have personally reviewed the radiological images below and agree with the radiology interpretations.  MRI brain 08/12/17 No acute or subacute finding. Advanced chronic appearing small vessel ischemic changes throughout the brain, particularly advanced affecting the cerebral hemispheric white matter. Multiple foci of hemosiderin deposition consistent with micro hemorrhages. These are widespread, but most pronounced in the left parietal region. The patient could have amyloid angiopathy.  CUS 04/15/17 Heterogeneous plaque with shadowing, bilaterally. Essentially stable 40-59% RICA stenosis. Essentially stable 3-66% LICA stenosis. >50% ECA stenosis, bilaterally. Normal subclavian arteries, bilaterally. Patent vertebral arteries with antegrade flow.  TTE 06/2016 - Left ventricle: Abnormal septal motion The cavity size was   normal. Systolic function was normal. The estimated ejection   fraction was in the range of 55% to 60%. Wall motion was normal;   there were no regional wall motion abnormalities. Left   ventricular diastolic function parameters were normal. - Atrial septum: No defect or patent foramen ovale was identified.  Lab Review  Component     Latest Ref Rng & Units 05/13/2016 07/16/2016 01/07/2017 05/16/2017  Cholesterol     0 - 200 mg/dL 174     Triglycerides     <150 mg/dL 91     HDL Cholesterol     >40 mg/dL 57     Total CHOL/HDL Ratio     RATIO 3.1     VLDL     0 - 40 mg/dL 18     LDL (calc)     0 - 99 mg/dL 99     TSH     0.35 - 4.50 uIU/mL  0.48 0.03 (L) 0.06 (L)  Vitamin B12     211 - 911 pg/mL  >  1500 (H)     Component      Latest Ref Rng & Units 06/25/2017  Cholesterol     0 - 200 mg/dL   Triglycerides     <150 mg/dL   HDL Cholesterol     >40 mg/dL   Total CHOL/HDL Ratio     RATIO   VLDL     0 - 40 mg/dL   LDL (calc)     0 - 99 mg/dL   TSH     0.35 - 4.50 uIU/mL 1.48  Vitamin B12     211 - 911 pg/mL     Assessment and Plan:   In summary, [name] is a 76 y.o. female with PMH of CAD s/p stent on ASA 81, HTN, left bell's palsy without residue, CHF, HLD, carotid stenosis, smoker and hypothyroidism who presents as a new patient for episodes of right hand clutching, right sided numbness and abnormal MRI findings. MRI brain showed no acute stroke but numerous bilateral CMBs, L>R. The MRI consistent with cerebral amyloid angiopathy. She has high risk of cortical ICH. Her episodes are CCA related transient focal neurological episodes (TFNEs). Will do EEG, add keppra, and avoid antiplatelets or anticoagulation. OK with ASA 81mg  only. BP control < 140/90, and quit smoking.  -continue ASA 81mg  for cardiac prevention. But I will not recommend any further blood thinners beyond ASA 81mg  unless absolutely indicated. - recommend to have blood pressure cuff at home and check BP regularly, BP goal < 140/90.  - will do EEG to rule out seizure - will initiate Keppra to help control the episodes. Will start with 250mg  daily at night for 7 days and then 250mg  twice a day for 7 days and then 375mg  twice a day after. - follow up with Dr. Burt Knack. - follow up with PCP. - quit smoking completely. - follow up in 3 months.   Thank you very much for the opportunity to participate in the care of this patient.  Please do not hesitate to call if any questions or concerns arise.  Orders Placed This Encounter  Procedures  . EEG adult    Standing Status:   Future    Standing Expiration Date:   09/17/2018    Order Specific Question:   Where should this test be performed?    Answer:   [facility]    Meds ordered this encounter    Medications  . nitroGLYCERIN (NITROSTAT) 0.4 MG SL tablet    Sig: Place under the tongue.  . levETIRAcetam (KEPPRA) 250 MG tablet    Sig: Take one tab nightly for 7 days and then one tab bid for 7 days and then 1.5 tab bid after    Dispense:  90 tablet    Refill:  3    Patient Instructions  - you have the condition most consistent with cerebral amyloid angiopathy. Please continue ASA 81mg  for cardiac prevention. But I will not recommend any further blood thinners beyond ASA 81mg  unless absolutely indicated. - recommend to have blood pressure cuff at home and check BP regularly, BP goal < 140/90.  - will do EEG to rule out seizure - will initiate Keppra to help control the episodes. Will start with 250mg  daily at night for 7 days and then 250mg  twice a day for 7 days and then 375mg  twice a day after. - follow up with Dr. Burt Knack. - follow up with PCP. - quit smoking completely. - follow up in 3 months.    Addalynne Golding  Erlinda Hong, MD PhD [facility] [address] [place], [place] [address] [phone]

## 2017-09-26 ENCOUNTER — Ambulatory Visit (INDEPENDENT_AMBULATORY_CARE_PROVIDER_SITE_OTHER): Payer: Medicare Other | Admitting: Neurology

## 2017-09-26 ENCOUNTER — Other Ambulatory Visit: Payer: Self-pay | Admitting: *Deleted

## 2017-09-26 DIAGNOSIS — R2990 Unspecified symptoms and signs involving the nervous system: Secondary | ICD-10-CM | POA: Diagnosis not present

## 2017-09-26 DIAGNOSIS — I6523 Occlusion and stenosis of bilateral carotid arteries: Secondary | ICD-10-CM

## 2017-09-26 DIAGNOSIS — I680 Cerebral amyloid angiopathy: Secondary | ICD-10-CM

## 2017-09-26 NOTE — Procedures (Signed)
    History:  [name] is a 76 year old patient with a history of coronary artery disease and had a history of episodes of right-sided numbness.  The patient has had several episodes of right-sided numbness associated with drawing and clenching of the right hand which migrates up to the right arm shoulder and right face.  The patient has no jerking and no loss of consciousness.  The episode may last 10 minutes.  The patient is being evaluated for possible seizures.  This is a routine EEG.  No skull defects are noted.  Medications include albuterol, aspirin, Keppra, Synthroid, metoprolol, nitroglycerin, and Ultram.  EEG classification: Normal awake  Description of the recording: The background rhythms of this recording consists of a fairly well modulated medium amplitude alpha rhythm of 8 Hz that is reactive to eye opening and closure. As the record progresses, the patient appears to remain in the waking state throughout the recording. Photic stimulation was performed, resulting in a bilateral and symmetric photic driving response. Hyperventilation was also performed, resulting in a minimal buildup of the background rhythm activities without significant slowing seen. At no time during the recording does there appear to be evidence of spike or spike wave discharges or evidence of focal slowing. EKG monitor shows no evidence of cardiac rhythm abnormalities with a heart rate of 66.  Impression: This is a normal EEG recording in the waking state. No evidence of ictal or interictal discharges are seen.

## 2017-09-30 ENCOUNTER — Telehealth: Payer: Self-pay | Admitting: Internal Medicine

## 2017-09-30 ENCOUNTER — Telehealth: Payer: Self-pay

## 2017-09-30 NOTE — Telephone Encounter (Signed)
Son states pt has been experiencing the same abdominal pain again this am as she has for years. Family has  researched and feel pt has all the symptoms of pancreatitis.  Son would like to to know if pt has ever been tested for pancreatitis.

## 2017-09-30 NOTE — Telephone Encounter (Signed)
Please advise 

## 2017-09-30 NOTE — Telephone Encounter (Signed)
Pt husband called back and RN Katrina's message was relayed to pt's husband(which is on DPR)

## 2017-09-30 NOTE — Telephone Encounter (Signed)
Called and discussed.  Patient will be seen at 1 PM tomorrow

## 2017-09-30 NOTE — Telephone Encounter (Signed)
-----   Message from Rosalin Hawking, MD sent at 09/27/2017  1:53 PM EDT ----- Could you please let the patient know that the EEG test done recently in our office was normal. Please continue current treatment. Thanks.  Rosalin Hawking, MD PhD Stroke Neurology 09/27/2017 1:53 PM

## 2017-09-30 NOTE — Telephone Encounter (Signed)
If patient calls back tell her the EEG was normal thanks.  Left vm for patient to call back about EEG results.

## 2017-10-01 ENCOUNTER — Encounter: Payer: Self-pay | Admitting: Internal Medicine

## 2017-10-01 ENCOUNTER — Ambulatory Visit (INDEPENDENT_AMBULATORY_CARE_PROVIDER_SITE_OTHER): Payer: Medicare Other | Admitting: Internal Medicine

## 2017-10-01 VITALS — BP 102/64 | Temp 98.0°F | Ht 65.0 in | Wt 102.0 lb

## 2017-10-01 DIAGNOSIS — F17200 Nicotine dependence, unspecified, uncomplicated: Secondary | ICD-10-CM

## 2017-10-01 DIAGNOSIS — I6523 Occlusion and stenosis of bilateral carotid arteries: Secondary | ICD-10-CM

## 2017-10-01 DIAGNOSIS — I680 Cerebral amyloid angiopathy: Secondary | ICD-10-CM

## 2017-10-01 DIAGNOSIS — G8929 Other chronic pain: Secondary | ICD-10-CM | POA: Diagnosis not present

## 2017-10-01 DIAGNOSIS — I10 Essential (primary) hypertension: Secondary | ICD-10-CM | POA: Diagnosis not present

## 2017-10-01 DIAGNOSIS — R109 Unspecified abdominal pain: Secondary | ICD-10-CM

## 2017-10-01 NOTE — Patient Instructions (Signed)
Avoids foods high in acid such as tomatoes citrus juices, and spicy foods.  Avoid eating within two hours of lying down or before exercising.  Do not overheat.  Try smaller more frequent meals.  Return in 6 months for follow-up  Follow-up with Dr. Ardis Hughs if unimproved

## 2017-10-01 NOTE — Progress Notes (Signed)
Subjective:    Patient ID: [name], female    DOB: 11/04/1941, 76 y.o.   MRN: [id number]  HPI  76 year old patient who is seen today in follow-up.  She has a recent diagnosis of cerebral amyloid angiopathy and now is on Keppra for seizure prophylaxis.  She has a history of chronic abdominal pain.  Her son has done some research and is concerned about chronic pancreatitis.  The patient is status post cholecystectomy and a nondrinker.  She has had no episodes of prior acute pancreatitis.  Abdominal CT scans in the past have revealed a normal pancreas without calcifications Pain is intermittent, more marked in the epigastric area and right lower quadrant She has had multiple abdominal procedures in the past Including a laparoscopic partial colectomy as well as a GIST resection.  She has had a hysterectomy and appendectomy    Past Medical History:  Diagnosis Date  . Anemia   . Arthritis   . Blood transfusion without reported diagnosis   . Bronchitis    hx of  . CAD (coronary artery disease)    a. BMS to LAD 2008, multivessel disease // b. LHC 6/17 (ant-lat STEMI): LM ok, pLAD 70, mLAD 50, dLAD 60, oD2 70, oLCx 60, pRCA 30, dRCA 50, EF 35-45% with apical ballooning - no culprit for MI and no PCI performed - prob TakoTsubo CM  . Carotid artery disease ([place])    Carotid US 0/16: RICA 01-09%, LICA 3-23% >> follow-up 1 year  . Chronic back pain   . Diverticulitis   . Diverticulosis of colon (without mention of hemorrhage)   . Essential hypertension    Dr. Bluford Kaufmann [phone] at [place]  . Hemorrhage of gastrointestinal tract, unspecified   . History of echocardiogram    a. Echo 05/14/16: Mild focal basal septal hypertrophy, EF 60-65%, normal wall motion, grade 2 diastolic dysfunction, mild TR  . History of non-ST elevation myocardial infarction (NSTEMI) 2008  . History of pneumonia   . Hyperlipidemia   . Hypothyroidism   . 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 Personal history of colonic polyps    Dr. Ardis Hughs, GI  . Raynaud's syndrome   . Takotsubo cardiomyopathy    a. Admitted 6/17 with anterolateral STEMI >> no culprit on LHC and EF 35-45% with apical ballooning // b. Echo prior to discharge with EF 60-65% and normal wall motion //  c. Limited echo 7/17: EF 55-60%, no RWMA, no pericardial effusion  . Ulcer   . Urinary frequency      Social History   Social History  . Marital status: Married    Spouse name: N/A  . Number of children: N/A  . 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 Smoking status: Current Every Day Smoker    Packs/day: 0.75    Years: 1.00    Types: Cigarettes  . Smokeless tobacco: Never Used  . Alcohol use No  . Drug use: No  . Sexual activity: Not on file   Other Topics Concern  . Not on file   Social History Narrative  . No narrative on file    Past Surgical History:  Procedure Laterality Date  . ABDOMINAL HYSTERECTOMY    . ABDOMINAL HYSTERECTOMY  1975  . APPENDECTOMY  1955  . BREAST EXCISIONAL BIOPSY Right 1969   no visible scar  . CARDIAC CATHETERIZATION N/A 05/13/2016  Procedure: Left Heart Cath and Coronary Angiography;  Surgeon: Wellington Hampshire, MD;  Location: [facility];  Service: Cardiovascular;  Laterality: N/A;  . CHOLECYSTECTOMY    . CORONARY ANGIOPLASTY WITH STENT PLACEMENT     x1, 2007or 2008  . GASTROINTESTINAL STOMAL TUMOR RESECTION    . LAPAROSCOPIC PARTIAL COLECTOMY  12/23/2012   Procedure: LAPAROSCOPIC PARTIAL COLECTOMY;  Surgeon: Stark Klein, MD;  Location: [facility];  Service: General;  Laterality: N/A;  . PARTIAL COLECTOMY  12/23/2012  . TUBAL LIGATION      Family History  Problem Relation Age of Onset  . Heart disease Mother        Verdie Drown  . Breast cancer Mother   . Lung cancer Father        SISTER, UNCLE  . Cerebral aneurysm Sister        UNCLE NEPHEW  . Colon cancer Neg Hx     . Esophageal cancer Neg Hx   . Stomach cancer Neg Hx   . Rectal cancer Neg Hx     No Known Allergies  Current Outpatient Prescriptions on File Prior to Visit  Medication Sig Dispense Refill  . albuterol (PROVENTIL HFA;VENTOLIN HFA) 108 (90 Base) MCG/ACT inhaler Inhale 2 puffs into the lungs every 6 (six) hours as needed for wheezing or shortness of breath. 1 Inhaler 0  . aspirin EC 81 MG tablet Take 81 mg by mouth daily.    . CYANOCOBALAMIN IJ Inject 1,000 mcg as directed every 30 (thirty) days.    Marland Kitchen ibuprofen (ADVIL,MOTRIN) 200 MG tablet Take 400 mg by mouth daily as needed (arthritis pain).    Marland Kitchen levETIRAcetam (KEPPRA) 250 MG tablet Take one tab nightly for 7 days and then one tab bid for 7 days and then 1.5 tab bid after 90 tablet 3  . levothyroxine (SYNTHROID, LEVOTHROID) 75 MCG tablet Take 1 tablet (75 mcg total) by mouth daily before breakfast. 90 tablet 2  . metoprolol succinate (TOPROL-XL) 25 MG 24 hr tablet Take 25 mg by mouth daily.    . nitroGLYCERIN (NITROSTAT) 0.4 MG SL tablet Place under the tongue.    . traMADol (ULTRAM) 50 MG tablet Take 50 mg by mouth every 6 (six) hours as needed (pain).    . nitroGLYCERIN (NITROSTAT) 0.4 MG SL tablet Place 1 tablet (0.4 mg total) under the tongue every 5 (five) minutes as needed for chest pain. 25 tablet 3   No current facility-administered medications on file prior to visit.     BP 102/64 (BP Location: Left Arm, Patient Position: Sitting, Cuff Size: Normal)   Temp 98 F (36.7 C) (Oral)   Ht 5\' 5"  (1.651 m)   Wt 102 lb (46.3 kg)   BMI 16.97 kg/m           Past Medical History:  Diagnosis Date  . Anemia   . Arthritis   . Blood transfusion without reported diagnosis   . Bronchitis    hx of  . CAD (coronary artery disease)    a. BMS to LAD 2008, multivessel disease // b. LHC 6/17 (ant-lat STEMI): LM ok, pLAD 70, mLAD 50, dLAD 60, oD2 70, oLCx 60, pRCA 30, dRCA 50, EF 35-45% with apical ballooning - no culprit for MI and  no PCI performed - prob TakoTsubo CM  . Carotid artery disease ([place])    Carotid US 2/58: RICA 52-77%, LICA 8-24% >> follow-up 1 year  . Chronic back pain   . Diverticulitis   . Diverticulosis  of colon (without mention of hemorrhage)   . Essential hypertension    Dr. Bluford Kaufmann [phone] at [place]  . Hemorrhage of gastrointestinal tract, unspecified   . History of echocardiogram    a. Echo 05/14/16: Mild focal basal septal hypertrophy, EF 60-65%, normal wall motion, grade 2 diastolic dysfunction, mild TR  . History of non-ST elevation myocardial infarction (NSTEMI) 2008  . History of pneumonia   . Hyperlipidemia   . Hypothyroidism   . 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 Personal history of colonic polyps    Dr. Ardis Hughs, GI  . Raynaud's syndrome   . Takotsubo cardiomyopathy    a. Admitted 6/17 with anterolateral STEMI >> no culprit on LHC and EF 35-45% with apical ballooning // b. Echo prior to discharge with EF 60-65% and normal wall motion //  c. Limited echo 7/17: EF 55-60%, no RWMA, no pericardial effusion  . Ulcer   . Urinary frequency    records reviewed.  Review of Systems  Constitutional: Negative.   HENT: Negative for congestion, dental problem, hearing loss, rhinorrhea, sinus pressure, sore throat and tinnitus.   Eyes: Negative for pain, discharge and visual disturbance.  Respiratory: Negative for cough and shortness of breath.   Cardiovascular: Negative for chest pain, palpitations and leg swelling.  Gastrointestinal: Positive for abdominal pain. Negative for abdominal distention, blood in stool, constipation, diarrhea, nausea and vomiting.  Genitourinary: Negative for difficulty urinating, dysuria, flank pain, frequency, hematuria, pelvic pain, urgency, vaginal bleeding, vaginal discharge and vaginal pain.  Musculoskeletal: Negative for arthralgias, gait problem and joint swelling.  Skin: Negative for rash.  Neurological:  Negative for dizziness, syncope, speech difficulty, weakness, numbness and headaches.  Hematological: Negative for adenopathy.  Psychiatric/Behavioral: Negative for agitation, behavioral problems and dysphoric mood. The patient is not nervous/anxious.        Objective:   Physical Exam  Constitutional: She is oriented to person, place, and time. She appears well-developed and well-nourished.  HENT:  Head: Normocephalic.  Right Ear: External ear normal.  Left Ear: External ear normal.  Mouth/Throat: Oropharynx is clear and moist.  Eyes: Pupils are equal, round, and reactive to light. Conjunctivae and EOM are normal.  Neck: Normal range of motion. Neck supple. No thyromegaly present.  Cardiovascular: Normal rate, regular rhythm, normal heart sounds and intact distal pulses.   Pulmonary/Chest: Effort normal and breath sounds normal.  Abdominal: Soft. Bowel sounds are normal. She exhibits no mass. There is tenderness.  Very mild nonlocalized diffuse abdominal tenderness Bowel sounds active No guarding  Musculoskeletal: Normal range of motion.  Lymphadenopathy:    She has no cervical adenopathy.  Neurological: She is alert and oriented to person, place, and time.  Skin: Skin is warm and dry. No rash noted.  Psychiatric: She has a normal mood and affect. Her behavior is normal.          Assessment & Plan:   Chronic abdominal pain.  Patient reassured.  Continue high-fiber diet and analgesics as needed.  Follow-up with GI if symptoms worsen. History of hypertension Coronary artery disease Cerebral amyloid angiopathy.  Follow-up neurology  No change in medical regimen Follow-up here 6 months or as needed  Nyoka Cowden

## 2017-10-01 NOTE — Telephone Encounter (Signed)
Pt has OV on 10/01/17 @ 1pm

## 2017-10-21 ENCOUNTER — Ambulatory Visit: Payer: Medicare Other | Admitting: Cardiovascular Disease

## 2017-10-26 ENCOUNTER — Other Ambulatory Visit: Payer: Self-pay | Admitting: Internal Medicine

## 2017-10-29 ENCOUNTER — Other Ambulatory Visit: Payer: Self-pay | Admitting: Internal Medicine

## 2017-11-04 DIAGNOSIS — H61393 Other acquired stenosis of external ear canal, bilateral: Secondary | ICD-10-CM | POA: Diagnosis not present

## 2017-11-04 DIAGNOSIS — H6123 Impacted cerumen, bilateral: Secondary | ICD-10-CM | POA: Diagnosis not present

## 2017-11-04 DIAGNOSIS — H903 Sensorineural hearing loss, bilateral: Secondary | ICD-10-CM | POA: Diagnosis not present

## 2017-11-05 ENCOUNTER — Ambulatory Visit (HOSPITAL_COMMUNITY)
Admission: RE | Admit: 2017-11-05 | Discharge: 2017-11-05 | Disposition: A | Discharge status: Home / Self Care | Payer: Medicare Other | Source: Ambulatory Visit | Attending: Cardiology | Admitting: Cardiology

## 2017-11-05 DIAGNOSIS — E785 Hyperlipidemia, unspecified: Secondary | ICD-10-CM | POA: Insufficient documentation

## 2017-11-05 DIAGNOSIS — F17200 Nicotine dependence, unspecified, uncomplicated: Secondary | ICD-10-CM | POA: Diagnosis not present

## 2017-11-05 DIAGNOSIS — I10 Essential (primary) hypertension: Secondary | ICD-10-CM | POA: Insufficient documentation

## 2017-11-05 DIAGNOSIS — I6523 Occlusion and stenosis of bilateral carotid arteries: Secondary | ICD-10-CM | POA: Insufficient documentation

## 2017-11-05 DIAGNOSIS — I2510 Atherosclerotic heart disease of native coronary artery without angina pectoris: Secondary | ICD-10-CM | POA: Insufficient documentation

## 2017-11-05 DIAGNOSIS — I252 Old myocardial infarction: Secondary | ICD-10-CM | POA: Insufficient documentation

## 2017-12-24 ENCOUNTER — Encounter: Payer: Self-pay | Admitting: Neurology

## 2017-12-24 ENCOUNTER — Ambulatory Visit (INDEPENDENT_AMBULATORY_CARE_PROVIDER_SITE_OTHER): Payer: Medicare Other | Admitting: Neurology

## 2017-12-24 VITALS — BP 143/79 | HR 56 | Wt 103.4 lb

## 2017-12-24 DIAGNOSIS — I680 Cerebral amyloid angiopathy: Secondary | ICD-10-CM | POA: Diagnosis not present

## 2017-12-24 DIAGNOSIS — Z8669 Personal history of other diseases of the nervous system and sense organs: Secondary | ICD-10-CM | POA: Diagnosis not present

## 2017-12-24 DIAGNOSIS — I10 Essential (primary) hypertension: Secondary | ICD-10-CM | POA: Diagnosis not present

## 2017-12-24 NOTE — Progress Notes (Signed)
NEUROLOGY CLINIC NEW PATIENT NOTE  NAME: [name] DOB: [birth year] REFERRING PHYSICIAN: Sherren Mocha, MD  I saw [name] as a new consult in the neurovascular clinic today regarding  Chief Complaint  Patient presents with  . Follow-up    Stroke follow up  .  HPI: [name] is a 77 y.o. female with PMH of CAD s/p stent on ASA 22, HTN, left bell's palsy without residue, CHF, HLD, carotid stenosis, current smoker and hypothyroidism who presents as a new patient for episodes of right sided numbness and abnormal MRI findings. She is accompanied by her son.   Pt stated that she had total 3 episodes of right sided numbness starting about 6 weeks ago, the last one was one week ago. The right hand starting to clutch, drawing up, with numbness, which migrating to right arm, shoulder and then right face. No pain, no shaking jerking, no LOC. Lasting 10 min and resolved. No leg involvement. She went to see her cardiologist Dr. Burt Knack and had MRI done showed no acute stroke but numerous bilateral CMBs, L>R. She was referred here for further evaluation.   Pt has CAD s/p stent, was on ASA and plavix in the past, but now on ASA 81, followed with cardiology and stable. HTN but did not check BP at home and today in clinic 140/90. Once a while takes advil for pain. Has bell's palsy 20 years ago on the left, no residue. Has hypothyroidism on synthroid. Son did admit that pt has mild memory issue and confusion for the last 1-2 years.  Interval history During the interval time, patient only had one episode of mild right sided numbness.  EEG negative for seizure. Patient still on aspirin 81, however, not compliant with Keppra.  She only took [place] and rare occasions.  BP 143/79 today, not checking BP at home.  Major complaint of the day is headache.  She stated that her headache has been going on for a long time, throbbing pain, whole head hurts including frontal and the back of the head.   Medication not quite effective.  On examination, patient has  significant bilateral occipital neuralgia.  However, patient declined occipital nerve block.  Not willing to quit smoke at this time.   Past Medical History:  Diagnosis Date  . Anemia   . Arthritis   . Blood transfusion without reported diagnosis   . Bronchitis    hx of  . CAD (coronary artery disease)    a. BMS to LAD 2008, multivessel disease // b. LHC 6/17 (ant-lat STEMI): LM ok, pLAD 70, mLAD 50, dLAD 60, oD2 70, oLCx 60, pRCA 30, dRCA 50, EF 35-45% with apical ballooning - no culprit for MI and no PCI performed - prob TakoTsubo CM  . Carotid artery disease ([place])    Carotid US 4/19: RICA 37-90%, LICA 2-40% >> follow-up 1 year  . Chronic back pain   . Diverticulitis   . Diverticulosis of colon (without mention of hemorrhage)   . Essential hypertension    Dr. Bluford Kaufmann [phone] at [place]  . Hemorrhage of gastrointestinal tract, unspecified   . History of echocardiogram    a. Echo 05/14/16: Mild focal basal septal hypertrophy, EF 60-65%, normal wall motion, grade 2 diastolic dysfunction, mild TR  . History of non-ST elevation myocardial infarction (NSTEMI) 2008  . History of pneumonia   . Hyperlipidemia   . Hypothyroidism   . 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  Personal history of colonic polyps    Dr. Ardis Hughs, GI  . Raynaud's syndrome   . Takotsubo cardiomyopathy    a. Admitted 6/17 with anterolateral STEMI >> no culprit on LHC and EF 35-45% with apical ballooning // b. Echo prior to discharge with EF 60-65% and normal wall motion //  c. Limited echo 7/17: EF 55-60%, no RWMA, no pericardial effusion  . Ulcer   . Urinary frequency    Past Surgical History:  Procedure Laterality Date  . ABDOMINAL HYSTERECTOMY    . ABDOMINAL HYSTERECTOMY  1975  . APPENDECTOMY  1955  . BREAST EXCISIONAL BIOPSY Right 1969   no visible scar  . CARDIAC CATHETERIZATION N/A 05/13/2016    Procedure: Left Heart Cath and Coronary Angiography;  Surgeon: Wellington Hampshire, MD;  Location: [facility];  Service: Cardiovascular;  Laterality: N/A;  . CHOLECYSTECTOMY    . CORONARY ANGIOPLASTY WITH STENT PLACEMENT     x1, 2007or 2008  . GASTROINTESTINAL STOMAL TUMOR RESECTION    . LAPAROSCOPIC PARTIAL COLECTOMY  12/23/2012   Procedure: LAPAROSCOPIC PARTIAL COLECTOMY;  Surgeon: Stark Klein, MD;  Location: [facility];  Service: General;  Laterality: N/A;  . PARTIAL COLECTOMY  12/23/2012  . TUBAL LIGATION     Family History  Problem Relation Age of Onset  . Heart disease Mother        Verdie Drown  . Breast cancer Mother   . Lung cancer Father        SISTER, UNCLE  . Cerebral aneurysm Sister        UNCLE NEPHEW  . Colon cancer Neg Hx   . Esophageal cancer Neg Hx   . Stomach cancer Neg Hx   . Rectal cancer Neg Hx    Current Outpatient Medications  Medication Sig Dispense Refill  . albuterol (PROVENTIL HFA;VENTOLIN HFA) 108 (90 Base) MCG/ACT inhaler Inhale 2 puffs into the lungs every 6 (six) hours as needed for wheezing or shortness of breath. 1 Inhaler 0  . aspirin EC 81 MG tablet Take 81 mg by mouth daily.    . CYANOCOBALAMIN IJ Inject 1,000 mcg as directed every 30 (thirty) days.    Marland Kitchen ibuprofen (ADVIL,MOTRIN) 200 MG tablet Take 400 mg by mouth daily as needed (arthritis pain).    Marland Kitchen levETIRAcetam (KEPPRA) 250 MG tablet Take one tab nightly for 7 days and then one tab bid for 7 days and then 1.5 tab bid after 90 tablet 3  . levothyroxine (SYNTHROID, LEVOTHROID) 75 MCG tablet Take 1 tablet (75 mcg total) by mouth daily before breakfast. 90 tablet 2  . metoprolol succinate (TOPROL-XL) 50 MG 24 hr tablet Take 25 mg by mouth.     . nitroGLYCERIN (NITROSTAT) 0.4 MG SL tablet Place 1 tablet (0.4 mg total) under the tongue every 5 (five) minutes as needed for chest pain. 25 tablet 3  . traMADol (ULTRAM) 50 MG tablet Take by mouth every 6 (six) hours as needed.     No current  facility-administered medications for this visit.    No Known Allergies Social History   Socioeconomic History  . Marital status: Married    Spouse name: Not on file  . Number of children: Not on file  . Years of education: Not on file  . Highest education level: Not on file  Social Needs  . Financial resource strain: Not on file  . Food insecurity - worry: Not on file  . Food insecurity - inability: Not on file  . Transportation needs -  medical: Not on file  . Transportation needs - non-medical: Not on file  Occupational History  . Occupation: HOUSEPAINTER  Tobacco Use  . Smoking status: Current Every Day Smoker    Packs/day: 1.00    Years: 0.00    Pack years: 0.00    Types: Cigarettes  . Smokeless tobacco: Never Used  Substance and Sexual Activity  . Alcohol use: No    Alcohol/week: 0.0 oz  . Drug use: No  . Sexual activity: Not on file  Other Topics Concern  . Not on file  Social History Narrative  . Not on file    Review of Systems Full 14 system review of systems performed and notable only for those listed, all others are neg:  Constitutional:   Cardiovascular:  Ear/Nose/Throat:   Skin:  Eyes:   Respiratory:   Gastroitestinal:   Genitourinary: Incontinence of bladder Hematology/Lymphatic:   Endocrine:  Musculoskeletal: Back pain, neck pain Allergy/Immunology:   Neurological: Headache Psychiatric:  Sleep:    Physical Exam  Vitals:   12/24/17 1315  BP: (!) 143/79  Pulse: (!) 56    General - Well nourished, well developed, in no apparent distress.  Ophthalmologic - Sharp disc margins OU.  Cardiovascular - Regular rate and rhythm with no murmur.   Neck - supple, no nuchal rigidity .  Mental Status -  Level of arousal and orientation to time, place, and person were intact. Language including expression, naming, repetition, comprehension, reading, and writing was assessed and found intact. Fund of Knowledge was assessed and was  intact.  Cranial Nerves II - XII - II - Visual field intact OU. III, IV, VI - Extraocular movements intact. V - Facial sensation intact bilaterally. VII - Facial movement intact bilaterally. VIII - Hearing & vestibular intact bilaterally. X - Palate elevates symmetrically. XI - Chin turning & shoulder shrug intact bilaterally. XII - Tongue protrusion intact.  Motor Strength - The patient's strength was normal in all extremities and pronator drift was absent.  Bulk was normal and fasciculations were absent.   Motor Tone - Muscle tone was assessed at the neck and appendages and was normal.  Reflexes - The patient's reflexes were normal in all extremities and she had no pathological reflexes.  Sensory - Light touch, temperature/pinprick, vibration and proprioception, and Romberg testing were assessed and were normal.    Coordination - The patient had normal movements in the hands and feet with no ataxia or dysmetria.  Tremor was absent.  Gait and Station - The patient's transfers, posture, gait, station, and turns were observed as normal.   Imaging  I have personally reviewed the radiological images below and agree with the radiology interpretations.  MRI brain 08/12/17 No acute or subacute finding. Advanced chronic appearing small vessel ischemic changes throughout the brain, particularly advanced affecting the cerebral hemispheric white matter. Multiple foci of hemosiderin deposition consistent with micro hemorrhages. These are widespread, but most pronounced in the left parietal region. The patient could have amyloid angiopathy.  CUS 04/15/17 Heterogeneous plaque with shadowing, bilaterally. Essentially stable 40-59% RICA stenosis. Essentially stable 1-44% LICA stenosis. >50% ECA stenosis, bilaterally. Normal subclavian arteries, bilaterally. Patent vertebral arteries with antegrade flow.  TTE 06/2016 - Left ventricle: Abnormal septal motion The cavity size was   normal.  Systolic function was normal. The estimated ejection   fraction was in the range of 55% to 60%. Wall motion was normal;   there were no regional wall motion abnormalities. Left   ventricular diastolic function parameters  were normal. - Atrial septum: No defect or patent foramen ovale was identified.  Lab Review  Component     Latest Ref Rng & Units 05/13/2016 07/16/2016 01/07/2017 05/16/2017  Cholesterol     0 - 200 mg/dL 174     Triglycerides     <150 mg/dL 91     HDL Cholesterol     >40 mg/dL 57     Total CHOL/HDL Ratio     RATIO 3.1     VLDL     0 - 40 mg/dL 18     LDL (calc)     0 - 99 mg/dL 99     TSH     0.35 - 4.50 uIU/mL  0.48 0.03 (L) 0.06 (L)  Vitamin B12     211 - 911 pg/mL  >1500 (H)     Component     Latest Ref Rng & Units 06/25/2017  Cholesterol     0 - 200 mg/dL   Triglycerides     <150 mg/dL   HDL Cholesterol     >40 mg/dL   Total CHOL/HDL Ratio     RATIO   VLDL     0 - 40 mg/dL   LDL (calc)     0 - 99 mg/dL   TSH     0.35 - 4.50 uIU/mL 1.48  Vitamin B12     211 - 911 pg/mL     Assessment   In summary, [name] is a 76 y.o. female with PMH of CAD s/p stent on ASA 81, HTN, left bell's palsy without residue, CHF, HLD, carotid stenosis, smoker and hypothyroidism who presents as a new patient for episodes of right hand clutching, right sided numbness and abnormal MRI findings. MRI brain showed no acute stroke but numerous bilateral CMBs, L>R. The MRI consistent with cerebral amyloid angiopathy. She has high risk of cortical ICH. Her episodes are CCA related transient focal neurological episodes (TFNEs). EEG no seizure activity, add keppra, and avoid antiplatelets or anticoagulation. OK with ASA 81mg  only. BP goal < 140/90, and she has not quit smoking yet. Pt not compliant with keppra at all. Complains of HA and found to have bilateral occipital neuralgia, however, she declined occipital nerve block.  Plan: - continue ASA 81mg  for cardiac prevention.  But will not recommend any further blood thinners beyond ASA 81mg  unless absolutely indicated. - recommend to have blood pressure cuff at home and check BP regularly, BP goal < 140/90.  - you have occipital neuralgia, avoid compression on the right side of neck. Consider occipital nerve block next time if interested. But can take tylenol for the pain.  - start keppra with 250mg  daily at night for 7 days and then 250mg  twice a day. Take with food to avoid GI side effects - follow up with Dr. Burt Knack and PCP. - recommend to quit smoking completely. - follow up in 4 months.  I spent more than 25 minutes of face to face time with the patient. Greater than 50% of time was spent in counseling and coordination of care. We discussed medication compliance, recommend quit smoking, management of occipital neuralgia.  No orders of the defined types were placed in this encounter.   No orders of the defined types were placed in this encounter.   Patient Instructions  - continue ASA 81mg  for cardiac prevention. But I will not recommend any further blood thinners beyond ASA 81mg  unless absolutely indicated. - recommend to have blood pressure cuff at  home and check BP regularly, BP goal < 140/90.  - you have occipital neuralgia, avoid compression on the right side of neck. Consider occipital nerve block next time if interested. But can take tylenol for the pain.  - start keppra with 250mg  daily at night for 7 days and then 250mg  twice a day. Take with food to avoid GI side effects - follow up with Dr. Burt Knack and PCP. - recommend to quit smoking completely. - follow up in 4 months.    Rosalin Hawking, MD PhD [facility] [address] [place], [place] [address] [phone]

## 2017-12-24 NOTE — Patient Instructions (Addendum)
-   continue ASA 81mg  for cardiac prevention. But I will not recommend any further blood thinners beyond ASA 81mg  unless absolutely indicated. - recommend to have blood pressure cuff at home and check BP regularly, BP goal < 140/90.  - you have occipital neuralgia, avoid compression on the right side of neck. Consider occipital nerve block next time if interested. But can take tylenol for the pain.  - start keppra with 250mg  daily at night for 7 days and then 250mg  twice a day. Take with food to avoid GI side effects - follow up with Dr. Burt Knack and PCP. - recommend to quit smoking completely. - follow up in 4 months.

## 2018-01-07 ENCOUNTER — Other Ambulatory Visit: Payer: Self-pay

## 2018-01-07 ENCOUNTER — Inpatient Hospital Stay (HOSPITAL_COMMUNITY)
Admission: EM | Admit: 2018-01-07 | Discharge: 2018-01-10 | DRG: 065 | Disposition: A | Discharge status: Home Health | Payer: Medicare Other | Attending: Internal Medicine | Admitting: Internal Medicine

## 2018-01-07 ENCOUNTER — Emergency Department (HOSPITAL_COMMUNITY): Payer: Medicare Other

## 2018-01-07 DIAGNOSIS — M8580 Other specified disorders of bone density and structure, unspecified site: Secondary | ICD-10-CM | POA: Diagnosis present

## 2018-01-07 DIAGNOSIS — Z801 Family history of malignant neoplasm of trachea, bronchus and lung: Secondary | ICD-10-CM

## 2018-01-07 DIAGNOSIS — I110 Hypertensive heart disease with heart failure: Secondary | ICD-10-CM | POA: Diagnosis not present

## 2018-01-07 DIAGNOSIS — Z79890 Hormone replacement therapy (postmenopausal): Secondary | ICD-10-CM

## 2018-01-07 DIAGNOSIS — I10 Essential (primary) hypertension: Secondary | ICD-10-CM | POA: Diagnosis present

## 2018-01-07 DIAGNOSIS — K5730 Diverticulosis of large intestine without perforation or abscess without bleeding: Secondary | ICD-10-CM | POA: Diagnosis present

## 2018-01-07 DIAGNOSIS — M549 Dorsalgia, unspecified: Secondary | ICD-10-CM | POA: Diagnosis present

## 2018-01-07 DIAGNOSIS — I6789 Other cerebrovascular disease: Secondary | ICD-10-CM | POA: Diagnosis not present

## 2018-01-07 DIAGNOSIS — G8929 Other chronic pain: Secondary | ICD-10-CM | POA: Diagnosis present

## 2018-01-07 DIAGNOSIS — I7300 Raynaud's syndrome without gangrene: Secondary | ICD-10-CM | POA: Diagnosis present

## 2018-01-07 DIAGNOSIS — I639 Cerebral infarction, unspecified: Secondary | ICD-10-CM | POA: Diagnosis not present

## 2018-01-07 DIAGNOSIS — Z8249 Family history of ischemic heart disease and other diseases of the circulatory system: Secondary | ICD-10-CM

## 2018-01-07 DIAGNOSIS — I509 Heart failure, unspecified: Secondary | ICD-10-CM | POA: Diagnosis not present

## 2018-01-07 DIAGNOSIS — E039 Hypothyroidism, unspecified: Secondary | ICD-10-CM | POA: Diagnosis present

## 2018-01-07 DIAGNOSIS — R29700 NIHSS score 0: Secondary | ICD-10-CM | POA: Diagnosis present

## 2018-01-07 DIAGNOSIS — Z9049 Acquired absence of other specified parts of digestive tract: Secondary | ICD-10-CM

## 2018-01-07 DIAGNOSIS — J439 Emphysema, unspecified: Secondary | ICD-10-CM | POA: Diagnosis present

## 2018-01-07 DIAGNOSIS — I739 Peripheral vascular disease, unspecified: Secondary | ICD-10-CM

## 2018-01-07 DIAGNOSIS — M1990 Unspecified osteoarthritis, unspecified site: Secondary | ICD-10-CM | POA: Diagnosis present

## 2018-01-07 DIAGNOSIS — I680 Cerebral amyloid angiopathy: Secondary | ICD-10-CM | POA: Diagnosis present

## 2018-01-07 DIAGNOSIS — Z803 Family history of malignant neoplasm of breast: Secondary | ICD-10-CM

## 2018-01-07 DIAGNOSIS — I25119 Atherosclerotic heart disease of native coronary artery with unspecified angina pectoris: Secondary | ICD-10-CM | POA: Diagnosis present

## 2018-01-07 DIAGNOSIS — R202 Paresthesia of skin: Secondary | ICD-10-CM | POA: Diagnosis not present

## 2018-01-07 DIAGNOSIS — R4701 Aphasia: Secondary | ICD-10-CM | POA: Diagnosis present

## 2018-01-07 DIAGNOSIS — E785 Hyperlipidemia, unspecified: Secondary | ICD-10-CM | POA: Diagnosis present

## 2018-01-07 DIAGNOSIS — Z90710 Acquired absence of both cervix and uterus: Secondary | ICD-10-CM

## 2018-01-07 DIAGNOSIS — R130 Aphagia: Secondary | ICD-10-CM | POA: Diagnosis not present

## 2018-01-07 DIAGNOSIS — R200 Anesthesia of skin: Secondary | ICD-10-CM | POA: Diagnosis not present

## 2018-01-07 DIAGNOSIS — F17210 Nicotine dependence, cigarettes, uncomplicated: Secondary | ICD-10-CM | POA: Diagnosis present

## 2018-01-07 DIAGNOSIS — Z86010 Personal history of colonic polyps: Secondary | ICD-10-CM

## 2018-01-07 DIAGNOSIS — I2510 Atherosclerotic heart disease of native coronary artery without angina pectoris: Secondary | ICD-10-CM | POA: Diagnosis present

## 2018-01-07 DIAGNOSIS — I6529 Occlusion and stenosis of unspecified carotid artery: Secondary | ICD-10-CM | POA: Diagnosis present

## 2018-01-07 DIAGNOSIS — E854 Organ-limited amyloidosis: Secondary | ICD-10-CM | POA: Diagnosis present

## 2018-01-07 DIAGNOSIS — Z7982 Long term (current) use of aspirin: Secondary | ICD-10-CM

## 2018-01-07 DIAGNOSIS — Z955 Presence of coronary angioplasty implant and graft: Secondary | ICD-10-CM

## 2018-01-07 DIAGNOSIS — I779 Disorder of arteries and arterioles, unspecified: Secondary | ICD-10-CM | POA: Diagnosis present

## 2018-01-07 DIAGNOSIS — I6622 Occlusion and stenosis of left posterior cerebral artery: Secondary | ICD-10-CM | POA: Diagnosis present

## 2018-01-07 DIAGNOSIS — I252 Old myocardial infarction: Secondary | ICD-10-CM

## 2018-01-07 DIAGNOSIS — R569 Unspecified convulsions: Secondary | ICD-10-CM | POA: Diagnosis present

## 2018-01-07 LAB — COMPREHENSIVE METABOLIC PANEL
ALBUMIN: 3.6 g/dL (ref 3.5–5.0)
ALT: 13 U/L — AB (ref 14–54)
AST: 19 U/L (ref 15–41)
Alkaline Phosphatase: 75 U/L (ref 38–126)
Anion gap: 9 (ref 5–15)
BUN: 13 mg/dL (ref 6–20)
CHLORIDE: 103 mmol/L (ref 101–111)
CO2: 29 mmol/L (ref 22–32)
CREATININE: 0.99 mg/dL (ref 0.44–1.00)
Calcium: 9.2 mg/dL (ref 8.9–10.3)
GFR calc Af Amer: >60 mL/min (ref >60)
GFR, EST NON AFRICAN AMERICAN: 54 mL/min — AB (ref >60)
GLUCOSE: 109 mg/dL — AB (ref 65–99)
POTASSIUM: 3.9 mmol/L (ref 3.5–5.1)
Sodium: 141 mmol/L (ref 135–145)
Total Bilirubin: 0.6 mg/dL (ref 0.3–1.2)
Total Protein: 5.6 g/dL — ABNORMAL LOW (ref 6.5–8.1)

## 2018-01-07 LAB — CBC
HEMATOCRIT: 43 % (ref 36.0–46.0)
HEMOGLOBIN: 13.7 g/dL (ref 12.0–15.0)
MCH: 31.5 pg (ref 26.0–34.0)
MCHC: 31.9 g/dL (ref 30.0–36.0)
MCV: 98.9 fL (ref 78.0–100.0)
Platelets: 177 10*3/uL (ref 150–400)
RBC: 4.35 MIL/uL (ref 3.87–5.11)
RDW: 13.9 % (ref 11.5–15.5)
WBC: 5.4 10*3/uL (ref 4.0–10.5)

## 2018-01-07 LAB — I-STAT CHEM 8, ED
BUN: 14 mg/dL (ref 6–20)
CREATININE: 1 mg/dL (ref 0.44–1.00)
Calcium, Ion: 1.17 mmol/L (ref 1.15–1.40)
Chloride: 101 mmol/L (ref 101–111)
GLUCOSE: 102 mg/dL — AB (ref 65–99)
HEMATOCRIT: 41 % (ref 36.0–46.0)
HEMOGLOBIN: 13.9 g/dL (ref 12.0–15.0)
POTASSIUM: 3.7 mmol/L (ref 3.5–5.1)
Sodium: 142 mmol/L (ref 135–145)
TCO2: 30 mmol/L (ref 22–32)

## 2018-01-07 LAB — PROTIME-INR
INR: 1.02
Prothrombin Time: 13.3 seconds (ref 11.4–15.2)

## 2018-01-07 LAB — DIFFERENTIAL
BASOS ABS: 0 10*3/uL (ref 0.0–0.1)
BASOS PCT: 0 %
EOS ABS: 0.1 10*3/uL (ref 0.0–0.7)
Eosinophils Relative: 2 %
LYMPHS ABS: 2.5 10*3/uL (ref 0.7–4.0)
Lymphocytes Relative: 47 %
MONOS PCT: 6 %
Monocytes Absolute: 0.3 10*3/uL (ref 0.1–1.0)
NEUTROS ABS: 2.4 10*3/uL (ref 1.7–7.7)
NEUTROS PCT: 45 %

## 2018-01-07 LAB — APTT: APTT: 30 s (ref 24–36)

## 2018-01-07 LAB — I-STAT TROPONIN, ED: Troponin i, poc: 0 ng/mL (ref 0.00–0.08)

## 2018-01-07 NOTE — ED Provider Notes (Signed)
[facility] EMERGENCY DEPARTMENT Provider Note   CSN: [id number] Arrival date & time: 01/07/18  2209     History   Chief Complaint Chief Complaint  Patient presents with  . Numbness  . Aphasia    HPI [name] is a 77 y.o. female.  The history is provided by the patient and a relative. No language interpreter was used.    [name] is a 77 y.o. female who presents to the Emergency Department complaining of speech difficulties.  At about 830 this evening she had a 15-25 minute episode of speech difficulties with right handed numbness.  This episode occurred when she was talking to her sister at the time and her son was present.  She also complained of epigastric pain (chronic and unchanged from baseline).  She has a hx/o intermittent arm numbness (hand radiating to upper arm) - four prior episodes in the past.  None have been accompanied by the speech difficulties.  Denies any fever, nausea, vomiting, dysuria.  She does have chronic diarrhea.    Past Medical History:  Diagnosis Date  . Anemia   . Arthritis   . Blood transfusion without reported diagnosis   . Bronchitis    hx of  . CAD (coronary artery disease)    a. BMS to LAD 2008, multivessel disease // b. LHC 6/17 (ant-lat STEMI): LM ok, pLAD 70, mLAD 50, dLAD 60, oD2 70, oLCx 60, pRCA 30, dRCA 50, EF 35-45% with apical ballooning - no culprit for MI and no PCI performed - prob [place]o CM  . Carotid artery disease ([place])    Carotid US 6/38: RICA 75-64%, LICA 3-32% >> follow-up 1 year  . Chronic back pain   . Diverticulitis   . Diverticulosis of colon (without mention of hemorrhage)   . Essential hypertension    Dr. Bluford Kaufmann [phone] at [place]  . Hemorrhage of gastrointestinal tract, unspecified   . History of echocardiogram    a. Echo 05/14/16: Mild focal basal septal hypertrophy, EF 60-65%, normal wall motion, grade 2 diastolic dysfunction, mild TR  . History of non-ST  elevation myocardial infarction (NSTEMI) 2008  . History of pneumonia   . Hyperlipidemia   . Hypothyroidism   . 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 Personal history of colonic polyps    Dr. Ardis Hughs, GI  . Raynaud's syndrome   . Takotsubo cardiomyopathy    a. Admitted 6/17 with anterolateral STEMI >> no culprit on LHC and EF 35-45% with apical ballooning // b. Echo prior to discharge with EF 60-65% and normal wall motion //  c. Limited echo 7/17: EF 55-60%, no RWMA, no pericardial effusion  . Ulcer   . Urinary frequency     Patient Active Problem List   Diagnosis Date Noted  . CVA (cerebral vascular accident) ([place]) 01/08/2018  . Stroke ([place]) 01/08/2018  . Chronic abdominal pain 10/01/2017  . Coronary artery disease involving native coronary artery of native heart with angina pectoris ([place]) 09/18/2017  . History of Bell's palsy 09/18/2017  . Cerebral amyloid angiopathy (CODE) 09/17/2017  . COPD (chronic obstructive pulmonary disease) with emphysema ([place]) 07/09/2017  . Stress-induced cardiomyopathy 05/13/2016  . Acute coronary syndrome ([place]) 05/13/2016  . ST elevation myocardial infarction (STEMI) ([place])   . Vitamin B12 deficiency 08/09/2015  . Vitamin D deficiency 08/09/2015  . Osteoarthritis 02/25/2014  . Adenomatous colon polyp 12/05/2012  . Smoker 08/22/2012  . History of colonic polyps 07/28/2010  .  Carotid artery disease ([place]) 07/03/2010  . CEREBRAL ANEURYSM 06/29/2010  . PALPITATIONS 03/29/2009  . ABDOMINAL PAIN-RLQ 03/29/2009  . Backache 10/04/2008  . Dyslipidemia 03/25/2008  . ANXIETY 03/25/2008  . Coronary atherosclerosis 03/25/2008  . Raynaud's syndrome 03/25/2008  . OSTEOPENIA 03/25/2008  . GASTROINTESTINAL HEMORRHAGE, HX OF 03/25/2008  . Hypothyroidism 02/05/2008  . Essential hypertension 02/05/2008  . UTI 11/10/2007  . URINARY FREQUENCY 11/10/2007  . 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 UNSPECIFIED  HEMORRHAGE OF GASTROINTESTINAL TRACT 03/11/2007  . Diverticulosis of colon (without mention of hemorrhage) 02/15/1996    Past Surgical History:  Procedure Laterality Date  . ABDOMINAL HYSTERECTOMY    . ABDOMINAL HYSTERECTOMY  1975  . APPENDECTOMY  1955  . BREAST EXCISIONAL BIOPSY Right 1969   no visible scar  . CARDIAC CATHETERIZATION N/A 05/13/2016   Procedure: Left Heart Cath and Coronary Angiography;  Surgeon: Wellington Hampshire, MD;  Location: [facility];  Service: Cardiovascular;  Laterality: N/A;  . CHOLECYSTECTOMY    . CORONARY ANGIOPLASTY WITH STENT PLACEMENT     x1, 2007or 2008  . GASTROINTESTINAL STOMAL TUMOR RESECTION    . LAPAROSCOPIC PARTIAL COLECTOMY  12/23/2012   Procedure: LAPAROSCOPIC PARTIAL COLECTOMY;  Surgeon: Stark Klein, MD;  Location: [facility];  Service: General;  Laterality: N/A;  . PARTIAL COLECTOMY  12/23/2012  . TUBAL LIGATION      OB History    No data available       Home Medications    Prior to Admission medications   Medication Sig Start Date End Date Taking? Authorizing Provider  albuterol (PROVENTIL HFA;VENTOLIN HFA) 108 (90 Base) MCG/ACT inhaler Inhale 2 puffs into the lungs every 6 (six) hours as needed for wheezing or shortness of breath. 07/09/17  Yes Marletta Lor, MD  aspirin EC 81 MG tablet Take 81 mg by mouth every 6 (six) hours as needed for mild pain.    Yes [provider]  ibuprofen (ADVIL,MOTRIN) 200 MG tablet Take 400 mg by mouth daily as needed (arthritis pain).   Yes [provider]  levETIRAcetam (KEPPRA) 250 MG tablet Take one tab nightly for 7 days and then one tab bid for 7 days and then 1.5 tab bid after Patient taking differently: Take 250-500 mg by mouth 2 (two) times daily. Take one tab nightly for 7 days and then one tab bid for 7 days and then 1.5 tab bid after 09/17/17  Yes Rosalin Hawking, MD  levothyroxine (SYNTHROID, LEVOTHROID) 75 MCG tablet Take 1 tablet (75 mcg total) by mouth daily before  breakfast. 05/17/17  Yes Marletta Lor, MD  metoprolol succinate (TOPROL-XL) 50 MG 24 hr tablet Take 25 mg by mouth daily.    Yes [provider]  nitroGLYCERIN (NITROSTAT) 0.4 MG SL tablet Place 1 tablet (0.4 mg total) under the tongue every 5 (five) minutes as needed for chest pain. 05/17/16 01/07/18 Yes Marletta Lor, MD    Family History Family History  Problem Relation Age of Onset  . Heart disease Mother        Verdie Drown  . Breast cancer Mother   . Lung cancer Father        SISTER, UNCLE  . Cerebral aneurysm Sister        UNCLE NEPHEW  . Colon cancer Neg Hx   . Esophageal cancer Neg Hx   . Stomach cancer Neg Hx   . Rectal cancer Neg Hx     Social History Social History  Tobacco Use  . Smoking status: Current Every Day Smoker    Packs/day: 1.00    Years: 0.00    Pack years: 0.00    Types: Cigarettes  . Smokeless tobacco: Never Used  Substance Use Topics  . Alcohol use: No    Alcohol/week: 0.0 oz  . Drug use: No     Allergies   Patient has no known allergies.   Review of Systems Review of Systems  All other systems reviewed and are negative.    Physical Exam Updated Vital Signs BP (!) 166/96   Pulse (!) 59   Resp 18   Ht 5\' 5"  (1.651 m)   Wt 46.7 kg (103 lb)   SpO2 94%   BMI 17.14 kg/m   Physical Exam  Constitutional: She is oriented to person, place, and time. She appears well-developed and well-nourished.  HENT:  Head: Normocephalic and atraumatic.  Cardiovascular: Normal rate and regular rhythm.  No murmur heard. Pulmonary/Chest: Effort normal and breath sounds normal. No respiratory distress.  Abdominal: Soft. There is no tenderness. There is no rebound and no guarding.  Musculoskeletal: She exhibits no edema or tenderness.  Neurological: She is alert and oriented to person, place, and time. No cranial nerve deficit.  5/5 strength in all four extremities with sensation to light touch intact in all four extremities.   No pronator drift.  Visual fields grossly intact.    Skin: Skin is warm and dry.  Psychiatric: She has a normal mood and affect. Her behavior is normal.  Nursing note and vitals reviewed.    ED Treatments / Results  Labs (all labs ordered are listed, but only abnormal results are displayed) Labs Reviewed  COMPREHENSIVE METABOLIC PANEL - Abnormal; Notable for the following components:      Result Value   Glucose, Bld 109 (*)    Total Protein 5.6 (*)    ALT 13 (*)    GFR calc non Af Amer 54 (*)    All other components within normal limits  I-STAT CHEM 8, ED - Abnormal; Notable for the following components:   Glucose, Bld 102 (*)    All other components within normal limits  PROTIME-INR  APTT  CBC  DIFFERENTIAL  URINALYSIS, ROUTINE W REFLEX MICROSCOPIC  HEMOGLOBIN A1C  LIPID PANEL  I-STAT TROPONIN, ED  CBG MONITORING, ED    EKG  EKG Interpretation  Date/Time:  Tuesday January 07 2018 22:23:03 EST Ventricular Rate:  71 PR Interval:    QRS Duration: 81 QT Interval:  397 QTC Calculation: 432 R Axis:   -75 Text Interpretation:  Sinus rhythm Left anterior fascicular block Probable left ventricular hypertrophy Anterior Q waves, possibly due to LVH Baseline wander in lead(s) II III aVF V3 Abnormal ekg Confirmed by Carmin Muskrat [phone]) on 01/07/2018 10:30:45 PM Also confirmed by Carmin Muskrat ([id number]), editor Hattie Perch [id number])  on 01/08/2018 7:16:12 AM       Radiology Ct Head Wo Contrast  Result Date: 01/07/2018 CLINICAL DATA:  Intermittent right arm to mouth numbness with effusion EXAM: CT HEAD WITHOUT CONTRAST TECHNIQUE: Contiguous axial images were obtained from the base of the skull through the vertex without intravenous contrast. COMPARISON:  MRI brain 08/12/2017, CT brain 02/29/2016 FINDINGS: Brain: No acute territorial infarction, hemorrhage or intracranial mass is visualized. Mild atrophy. Extensive hypodensity throughout the periventricular and subcortical  white matter. Stable ventricle size. Vascular: No hyperdense vessels. Carotid vessel calcification. Vertebral artery calcification. Skull: Normal. Negative for fracture or focal lesion. Sinuses/Orbits: No acute  finding. Other: None IMPRESSION: 1. No definite CT evidence for acute intracranial abnormality. 2. Extensive hypodensity throughout the bilateral white matter, most likely small vessel ischemic change. Electronically Signed   By: Donavan Foil M.D.   On: 01/07/2018 23:16   Mr Brain Wo Contrast (neuro Protocol)  Result Date: 01/08/2018 CLINICAL DATA:  Initial evaluation for intermittent right-sided numbness with aphasia. EXAM: MRI HEAD WITHOUT CONTRAST TECHNIQUE: Multiplanar, multiecho pulse sequences of the brain and surrounding structures were obtained without intravenous contrast. COMPARISON:  Prior CT from 01/07/2018 as well as previous MRI from 08/12/2017. FINDINGS: Brain: Stable atrophy. Extensive T2/FLAIR hyperintensity involving the periventricular, deep, and subcortical white matter both cerebral hemispheres, most consistent with advanced chronic microvascular ischemic disease. Punctate 5 mm focus of diffusion abnormality within the parasagittal left parietal lobe, consistent with a small acute ischemic infarct (series 3, image 34). Additional punctate cortical infarct within the anterior inferior left frontal lobe (series 3, image 28). Additional 5 mm acute ischemic infarct within the subcortical white matter of the anterior right temporal lobe (series 3, image 16). Additional patchy diffusion abnormality within the subcortical white matter of the right parietal lobe suspicious for acute to subacute small vessel type ischemia (series 3, image 34, 32). No definite associated hemorrhage about these infarcts. No associated mass effect. Extensive chronic micro hemorrhages again seen throughout the brain, most notable within the left parietal region, suggesting cerebral amyloid angiopathy. No definite  acute intracranial hemorrhage. No mass lesion, midline shift or mass effect. No hydrocephalus. No extra-axial fluid collection. Major dural sinuses are grossly patent. Pituitary gland suprasellar region normal. Midline structures intact and normal. Vascular: Major intracranial vascular flow voids are maintained. Skull and upper cervical spine: Craniocervical junction within normal limits. Upper cervical spine normal. Bone marrow signal intensity within normal limits. No scalp soft tissue abnormality. Sinuses/Orbits: Globes and orbital soft tissues within normal limits. Paranasal sinuses are clear. No mastoid effusion. Inner ear structures normal. Other: None. IMPRESSION: 1. Scattered punctate subcentimeter acute ischemic infarcts involving the parasagittal left parietal lobe, inferior left frontal lobe, and anterior right temporal lobe as above. No associated hemorrhage or mass effect. 2. Additional small volume mild patchy diffusion abnormality within the subcortical white matter of the right parietal lobe, suspicious for acute to early subacute small vessel type ischemic changes. 3. Extensive chronic micro hemorrhages throughout the brain, greatest within the left parietal region. Findings suggestive of cerebral amyloid angiopathy. 4. Extensive chronic microvascular ischemic changes. Electronically Signed   By: Jeannine Boga M.D.   On: 01/08/2018 05:51    Procedures Procedures (including critical care time)  Medications Ordered in ED Medications  albuterol (PROVENTIL) (2.5 MG/3ML) 0.083% nebulizer solution 2.5 mg (not administered)  levETIRAcetam (KEPPRA) tablet 250-500 mg (not administered)  levothyroxine (SYNTHROID, LEVOTHROID) tablet 75 mcg (not administered)   stroke: mapping our early stages of recovery book (not administered)  0.9 %  sodium chloride infusion (not administered)  acetaminophen (TYLENOL) tablet 650 mg (not administered)    Or  acetaminophen (TYLENOL) solution 650 mg (not  administered)    Or  acetaminophen (TYLENOL) suppository 650 mg (not administered)  senna-docusate (Senokot-S) tablet 1 tablet (not administered)  enoxaparin (LOVENOX) injection 40 mg (not administered)  aspirin suppository 300 mg (not administered)    Or  aspirin tablet 325 mg (not administered)     Initial Impression / Assessment and Plan / ED Course  I have reviewed the triage vital signs and the nursing notes.  Pertinent labs & imaging results that were  available during my care of the patient were reviewed by me and considered in my medical decision making (see chart for details).  Clinical Course as of Jan 08 749  Wed Jan 08, 2018  0019 D/w Dr. Rory Percy with Neurology - recommends MRI brain, resuming keppra.    [ER]    Clinical Course User Index [ER] Quintella Reichert, MD    Patient here for evaluation of transient expressive aphasia as well as tingling in her right hand.  She is at her neurologic baseline on repeat assessment.  MRI obtained and is concerning for multiple acute infarcts.  Neurology consulted for recommendations and medicine consulted for admission.  Patient updated of findings and studies recommendation for admission for further treatment and she is in agreement with plan.  Final Clinical Impressions(s) / ED Diagnoses   Final diagnoses:  Stroke [facility])    ED Discharge Orders    None       Quintella Reichert, MD 01/08/18 [phone]

## 2018-01-07 NOTE — ED Triage Notes (Signed)
Pt from home via [facility]. C/o intermittent R arm to mouth numbness & periods of aphasia. Pt does see a neurologist and was told she has "a little blood clot in her head." Denies any pain @ this time. Pt A&O x4 on arrival.

## 2018-01-08 ENCOUNTER — Emergency Department (HOSPITAL_COMMUNITY): Payer: Medicare Other

## 2018-01-08 ENCOUNTER — Inpatient Hospital Stay (HOSPITAL_COMMUNITY): Payer: Medicare Other

## 2018-01-08 ENCOUNTER — Inpatient Hospital Stay (HOSPITAL_COMMUNITY)
Admit: 2018-01-08 | Discharge: 2018-01-08 | Disposition: A | Discharge status: Home / Self Care | Payer: Medicare Other | Attending: Internal Medicine | Admitting: Internal Medicine

## 2018-01-08 DIAGNOSIS — Z79890 Hormone replacement therapy (postmenopausal): Secondary | ICD-10-CM | POA: Diagnosis not present

## 2018-01-08 DIAGNOSIS — F17210 Nicotine dependence, cigarettes, uncomplicated: Secondary | ICD-10-CM | POA: Diagnosis present

## 2018-01-08 DIAGNOSIS — I639 Cerebral infarction, unspecified: Principal | ICD-10-CM

## 2018-01-08 DIAGNOSIS — R200 Anesthesia of skin: Secondary | ICD-10-CM | POA: Diagnosis not present

## 2018-01-08 DIAGNOSIS — I110 Hypertensive heart disease with heart failure: Secondary | ICD-10-CM | POA: Diagnosis present

## 2018-01-08 DIAGNOSIS — M1990 Unspecified osteoarthritis, unspecified site: Secondary | ICD-10-CM | POA: Diagnosis present

## 2018-01-08 DIAGNOSIS — I361 Nonrheumatic tricuspid (valve) insufficiency: Secondary | ICD-10-CM | POA: Diagnosis not present

## 2018-01-08 DIAGNOSIS — M8580 Other specified disorders of bone density and structure, unspecified site: Secondary | ICD-10-CM | POA: Diagnosis present

## 2018-01-08 DIAGNOSIS — I509 Heart failure, unspecified: Secondary | ICD-10-CM | POA: Diagnosis present

## 2018-01-08 DIAGNOSIS — I680 Cerebral amyloid angiopathy: Secondary | ICD-10-CM | POA: Diagnosis present

## 2018-01-08 DIAGNOSIS — E854 Organ-limited amyloidosis: Secondary | ICD-10-CM | POA: Diagnosis present

## 2018-01-08 DIAGNOSIS — Z8249 Family history of ischemic heart disease and other diseases of the circulatory system: Secondary | ICD-10-CM | POA: Diagnosis not present

## 2018-01-08 DIAGNOSIS — Z9049 Acquired absence of other specified parts of digestive tract: Secondary | ICD-10-CM | POA: Diagnosis not present

## 2018-01-08 DIAGNOSIS — I6300 Cerebral infarction due to thrombosis of unspecified precerebral artery: Secondary | ICD-10-CM | POA: Diagnosis not present

## 2018-01-08 DIAGNOSIS — Z7982 Long term (current) use of aspirin: Secondary | ICD-10-CM | POA: Diagnosis not present

## 2018-01-08 DIAGNOSIS — R4701 Aphasia: Secondary | ICD-10-CM | POA: Diagnosis present

## 2018-01-08 DIAGNOSIS — E039 Hypothyroidism, unspecified: Secondary | ICD-10-CM | POA: Diagnosis present

## 2018-01-08 DIAGNOSIS — Z801 Family history of malignant neoplasm of trachea, bronchus and lung: Secondary | ICD-10-CM | POA: Diagnosis not present

## 2018-01-08 DIAGNOSIS — I10 Essential (primary) hypertension: Secondary | ICD-10-CM | POA: Diagnosis not present

## 2018-01-08 DIAGNOSIS — I6529 Occlusion and stenosis of unspecified carotid artery: Secondary | ICD-10-CM | POA: Diagnosis present

## 2018-01-08 DIAGNOSIS — Z86010 Personal history of colonic polyps: Secondary | ICD-10-CM | POA: Diagnosis not present

## 2018-01-08 DIAGNOSIS — R130 Aphagia: Secondary | ICD-10-CM | POA: Diagnosis not present

## 2018-01-08 DIAGNOSIS — E785 Hyperlipidemia, unspecified: Secondary | ICD-10-CM | POA: Diagnosis not present

## 2018-01-08 DIAGNOSIS — Z90710 Acquired absence of both cervix and uterus: Secondary | ICD-10-CM | POA: Diagnosis not present

## 2018-01-08 DIAGNOSIS — I25119 Atherosclerotic heart disease of native coronary artery with unspecified angina pectoris: Secondary | ICD-10-CM | POA: Diagnosis not present

## 2018-01-08 DIAGNOSIS — J432 Centrilobular emphysema: Secondary | ICD-10-CM | POA: Diagnosis not present

## 2018-01-08 DIAGNOSIS — Z803 Family history of malignant neoplasm of breast: Secondary | ICD-10-CM | POA: Diagnosis not present

## 2018-01-08 DIAGNOSIS — K5730 Diverticulosis of large intestine without perforation or abscess without bleeding: Secondary | ICD-10-CM | POA: Diagnosis present

## 2018-01-08 DIAGNOSIS — I7300 Raynaud's syndrome without gangrene: Secondary | ICD-10-CM | POA: Diagnosis present

## 2018-01-08 DIAGNOSIS — J439 Emphysema, unspecified: Secondary | ICD-10-CM | POA: Diagnosis present

## 2018-01-08 DIAGNOSIS — I2510 Atherosclerotic heart disease of native coronary artery without angina pectoris: Secondary | ICD-10-CM | POA: Diagnosis present

## 2018-01-08 LAB — ECHOCARDIOGRAM COMPLETE
Height: 65 in
Weight: 1648 oz

## 2018-01-08 LAB — LIPID PANEL
CHOL/HDL RATIO: 2.8 ratio
CHOLESTEROL: 158 mg/dL (ref 0–200)
HDL: 56 mg/dL (ref >40)
LDL CALC: 83 mg/dL (ref 0–99)
TRIGLYCERIDES: 96 mg/dL (ref <150)
VLDL: 19 mg/dL (ref 0–40)

## 2018-01-08 LAB — URINALYSIS, ROUTINE W REFLEX MICROSCOPIC
BILIRUBIN URINE: NEGATIVE
Glucose, UA: NEGATIVE mg/dL
Hgb urine dipstick: NEGATIVE
KETONES UR: NEGATIVE mg/dL
LEUKOCYTES UA: NEGATIVE
NITRITE: NEGATIVE
PH: 7 (ref 5.0–8.0)
PROTEIN: NEGATIVE mg/dL
Specific Gravity, Urine: 1.008 (ref 1.005–1.030)

## 2018-01-08 LAB — HEMOGLOBIN A1C
Hgb A1c MFr Bld: 5.8 % — ABNORMAL HIGH (ref 4.8–5.6)
Mean Plasma Glucose: 119.76 mg/dL

## 2018-01-08 LAB — TSH: TSH: 3.557 u[IU]/mL (ref 0.350–4.500)

## 2018-01-08 MED ORDER — ACETAMINOPHEN 160 MG/5ML PO SOLN: 650.0000 mg | ORAL | Status: DC | PRN

## 2018-01-08 MED ORDER — ASPIRIN 300 MG RE SUPP: 300.0000 mg | Freq: Every day | RECTAL | Status: DC

## 2018-01-08 MED ORDER — ATORVASTATIN CALCIUM 40 MG PO TABS
40.0000 mg | ORAL_TABLET | Freq: Every day | ORAL | Status: DC
  Administered 2018-01-08 – 2018-01-09 (×2): 40 mg via ORAL
  Filled 2018-01-08 (×2): qty 1

## 2018-01-08 MED ORDER — ASPIRIN 325 MG PO TABS
325.0000 mg | ORAL_TABLET | Freq: Every day | ORAL | Status: DC
  Filled 2018-01-08: qty 1

## 2018-01-08 MED ORDER — SODIUM CHLORIDE 0.9 % IV SOLN
INTRAVENOUS | Status: AC
  Administered 2018-01-08: 10:00:00 via INTRAVENOUS

## 2018-01-08 MED ORDER — LEVOTHYROXINE SODIUM 75 MCG PO TABS
75.0000 ug | ORAL_TABLET | Freq: Every day | ORAL | Status: DC
  Administered 2018-01-08 – 2018-01-10 (×3): 75 ug via ORAL
  Filled 2018-01-08 (×5): qty 1

## 2018-01-08 MED ORDER — LEVETIRACETAM 500 MG PO TABS
500.0000 mg | ORAL_TABLET | Freq: Two times a day (BID) | ORAL | Status: DC
  Administered 2018-01-08 – 2018-01-10 (×5): 500 mg via ORAL
  Filled 2018-01-08 (×6): qty 1

## 2018-01-08 MED ORDER — IOPAMIDOL (ISOVUE-370) INJECTION 76%
INTRAVENOUS | Status: AC
  Administered 2018-01-08: 50 mL
  Filled 2018-01-08: qty 50

## 2018-01-08 MED ORDER — STROKE: EARLY STAGES OF RECOVERY BOOK
Freq: Once | Status: AC
  Administered 2018-01-08: 22:00:00
  Filled 2018-01-08: qty 1

## 2018-01-08 MED ORDER — ASPIRIN EC 81 MG PO TBEC
81.0000 mg | DELAYED_RELEASE_TABLET | Freq: Every day | ORAL | Status: DC
  Administered 2018-01-08 – 2018-01-10 (×3): 81 mg via ORAL
  Filled 2018-01-08 (×3): qty 1

## 2018-01-08 MED ORDER — ALBUTEROL SULFATE (2.5 MG/3ML) 0.083% IN NEBU: 2.5000 mg | INHALATION_SOLUTION | Freq: Four times a day (QID) | RESPIRATORY_TRACT | Status: DC | PRN

## 2018-01-08 MED ORDER — SENNOSIDES-DOCUSATE SODIUM 8.6-50 MG PO TABS: 1.0000 | ORAL_TABLET | Freq: Every evening | ORAL | Status: DC | PRN

## 2018-01-08 MED ORDER — ENOXAPARIN SODIUM 40 MG/0.4ML ~~LOC~~ SOLN
40.0000 mg | Freq: Every day | SUBCUTANEOUS | Status: DC
  Administered 2018-01-08 – 2018-01-10 (×3): 40 mg via SUBCUTANEOUS
  Filled 2018-01-08 (×4): qty 0.4

## 2018-01-08 MED ORDER — ACETAMINOPHEN 325 MG PO TABS: 650.0000 mg | ORAL_TABLET | ORAL | Status: DC | PRN

## 2018-01-08 MED ORDER — ACETAMINOPHEN 650 MG RE SUPP: 650.0000 mg | RECTAL | Status: DC | PRN

## 2018-01-08 NOTE — H&P (Signed)
History and Physical    [name] BMW:[id number] DOB: Jul 10, 1941 DOA: 01/07/2018  PCP: Marletta Lor, MD Patient coming from: home  Chief Complaint: right arm numbness/expressive aphasia  HPI: [name] is a 77 y.o. female with medical history significant for CAD, carotid artery disease, chronic back pain, hypertension, hyperlipidemia, hypothyroidism, takotsubo cardiomyopathy presents to the emergency department with the chief complaint of difficulty speaking accompanied with a right hand numbness and weakness. Initial evaluation included an MRI brain revealing acute infarct. [facility] are asked to admit  Information is obtained from the chart and the patient. She states she was in her usual state of health. She's had chronic intermittent right arm numbness/tingling/weakness for which she seen a neurologist. She is not completely sure that her right hand was any worse and has been in the past but she does remember having difficulty speaking. She says she knew her head but she wanted to say but couldn't tell the words coming out of her mouth were not correct. She became frustrated. Associated symptoms include headache but she says she "always has a headache". In addition she had epigastric pain this she also says is chronic and unchanged. She states she was on the telephone with her sister at the time of the event. She believes it lasted 15-20 minutes. She denies any dizziness syncope or near-syncope. She denies shortness of breath diaphoresis abdominal pain nausea vomiting. She denies any dysuria hematuria frequency or urgency. She denies any numbness tingling of her legs or difficulty walking. His diarrhea constipation melena bright red blood per rectum  ED Course: In the emergency department she's afebrile hemodynamically stable with a blood pressure the high end of normal.  Review of Systems: As per HPI otherwise all other systems reviewed and are negative.   Ambulatory  Status: Ambulates independently is independent with ADLs  Past Medical History:  Diagnosis Date  . Anemia   . Arthritis   . Blood transfusion without reported diagnosis   . Bronchitis    hx of  . CAD (coronary artery disease)    a. BMS to LAD 2008, multivessel disease // b. LHC 6/17 (ant-lat STEMI): LM ok, pLAD 70, mLAD 50, dLAD 60, oD2 70, oLCx 60, pRCA 30, dRCA 50, EF 35-45% with apical ballooning - no culprit for MI and no PCI performed - prob TakoTsubo CM  . Carotid artery disease ([place])    Carotid US 2/72: RICA 53-66%, LICA 4-40% >> follow-up 1 year  . Chronic back pain   . Diverticulitis   . Diverticulosis of colon (without mention of hemorrhage)   . Essential hypertension    Dr. Bluford Kaufmann [phone] at [facility]  . Hemorrhage of gastrointestinal tract, unspecified   . History of echocardiogram    a. Echo 05/14/16: Mild focal basal septal hypertrophy, EF 60-65%, normal wall motion, grade 2 diastolic dysfunction, mild TR  . History of non-ST elevation myocardial infarction (NSTEMI) 2008  . History of pneumonia   . Hyperlipidemia   . Hypothyroidism   . 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 Personal history of colonic polyps    Dr. Ardis Hughs, GI  . Raynaud's syndrome   . Takotsubo cardiomyopathy    a. Admitted 6/17 with anterolateral STEMI >> no culprit on LHC and EF 35-45% with apical ballooning // b. Echo prior to discharge with EF 60-65% and normal wall motion //  c. Limited echo 7/17: EF 55-60%, no RWMA, no pericardial effusion  . Ulcer   .  Urinary frequency     Past Surgical History:  Procedure Laterality Date  . ABDOMINAL HYSTERECTOMY    . ABDOMINAL HYSTERECTOMY  1975  . APPENDECTOMY  1955  . BREAST EXCISIONAL BIOPSY Right 1969   no visible scar  . CARDIAC CATHETERIZATION N/A 05/13/2016   Procedure: Left Heart Cath and Coronary Angiography;  Surgeon: Wellington Hampshire, MD;  Location: [facility];  Service: Cardiovascular;   Laterality: N/A;  . CHOLECYSTECTOMY    . CORONARY ANGIOPLASTY WITH STENT PLACEMENT     x1, 2007or 2008  . GASTROINTESTINAL STOMAL TUMOR RESECTION    . LAPAROSCOPIC PARTIAL COLECTOMY  12/23/2012   Procedure: LAPAROSCOPIC PARTIAL COLECTOMY;  Surgeon: Stark Klein, MD;  Location: [facility];  Service: General;  Laterality: N/A;  . PARTIAL COLECTOMY  12/23/2012  . TUBAL LIGATION      Social History   Socioeconomic History  . Marital status: Married    Spouse name: Not on file  . Number of children: Not on file  . Years of education: Not on file  . Highest education level: Not on file  Social Needs  . Financial resource strain: Not on file  . Food insecurity - worry: Not on file  . Food insecurity - inability: Not on file  . Transportation needs - medical: Not on file  . Transportation needs - non-medical: Not on file  Occupational History  . Occupation: HOUSEPAINTER  Tobacco Use  . Smoking status: Current Every Day Smoker    Packs/day: 1.00    Years: 0.00    Pack years: 0.00    Types: Cigarettes  . Smokeless tobacco: Never Used  Substance and Sexual Activity  . Alcohol use: No    Alcohol/week: 0.0 oz  . Drug use: No  . Sexual activity: Not on file  Other Topics Concern  . Not on file  Social History Narrative  . Not on file    No Known Allergies  Family History  Problem Relation Age of Onset  . Heart disease Mother        Verdie Drown  . Breast cancer Mother   . Lung cancer Father        SISTER, UNCLE  . Cerebral aneurysm Sister        UNCLE NEPHEW  . Colon cancer Neg Hx   . Esophageal cancer Neg Hx   . Stomach cancer Neg Hx   . Rectal cancer Neg Hx     Prior to Admission medications   Medication Sig Start Date End Date Taking? Authorizing Provider  albuterol (PROVENTIL HFA;VENTOLIN HFA) 108 (90 Base) MCG/ACT inhaler Inhale 2 puffs into the lungs every 6 (six) hours as needed for wheezing or shortness of breath. 07/09/17  Yes Marletta Lor, MD  aspirin  EC 81 MG tablet Take 81 mg by mouth every 6 (six) hours as needed for mild pain.    Yes [provider]  ibuprofen (ADVIL,MOTRIN) 200 MG tablet Take 400 mg by mouth daily as needed (arthritis pain).   Yes [provider]  levETIRAcetam (KEPPRA) 250 MG tablet Take one tab nightly for 7 days and then one tab bid for 7 days and then 1.5 tab bid after Patient taking differently: Take 250-500 mg by mouth 2 (two) times daily. Take one tab nightly for 7 days and then one tab bid for 7 days and then 1.5 tab bid after 09/17/17  Yes Rosalin Hawking, MD  levothyroxine (SYNTHROID, LEVOTHROID) 75 MCG tablet Take 1 tablet (75 mcg total) by  mouth daily before breakfast. 05/17/17  Yes Marletta Lor, MD  metoprolol succinate (TOPROL-XL) 50 MG 24 hr tablet Take 25 mg by mouth daily.    Yes [provider]  nitroGLYCERIN (NITROSTAT) 0.4 MG SL tablet Place 1 tablet (0.4 mg total) under the tongue every 5 (five) minutes as needed for chest pain. 05/17/16 01/07/18 Yes Marletta Lor, MD    Physical Exam: Vitals:   01/08/18 0330 01/08/18 0412 01/08/18 0603 01/08/18 0630  BP: (!) 146/80 (!) 151/88 (!) 160/90 (!) 166/96  Pulse: (!) 58 (!) 57  (!) 59  Resp: 18 17 13 18   SpO2: 94% 95%  94%  Weight:      Height:         General:  Appears calm and comfortable in no acute distress Eyes:  PERRL, EOMI, normal lids, iris ENT:  grossly normal hearing, lips & tongue, mucous membranes of her mouth are pink but dry Neck:  no LAD, masses or thyromegaly Cardiovascular:  RRR, no m/r/g. No LE edema. Pedal pulses present and palpable Respiratory:  CTA bilaterally, no w/r/r. Normal respiratory effort. Abdomen:  soft, ntnd, positive bowel sounds throughout no guarding or rebounding Skin:  no rash or induration seen on limited exam Musculoskeletal:  grossly normal tone BUE/BLE, good ROM, no bony abnormality Psychiatric:  grossly normal mood and affect, speech fluent and appropriate,  AOx3 Neurologic:  Speech clear facial symmetry. Bilateral grip 5 out of 5 lower extremity strength 5 out of 5 sensation intact  Labs on Admission: I have personally reviewed following labs and imaging studies  CBC: Recent Labs  Lab 01/07/18 2247 01/07/18 2251  WBC  --  5.4  NEUTROABS  --  2.4  HGB 13.9 13.7  HCT 41.0 43.0  MCV  --  98.9  PLT  --  673   Basic Metabolic Panel: Recent Labs  Lab 01/07/18 2247 01/07/18 2251  NA 142 141  K 3.7 3.9  CL 101 103  CO2  --  29  GLUCOSE 102* 109*  BUN 14 13  CREATININE 1.00 0.99  CALCIUM  --  9.2   GFR: Estimated Creatinine Clearance: 36.2 mL/min (by C-G formula based on SCr of 0.99 mg/dL). Liver Function Tests: Recent Labs  Lab 01/07/18 2251  AST 19  ALT 13*  ALKPHOS 75  BILITOT 0.6  PROT 5.6*  ALBUMIN 3.6   No results for input(s): LIPASE, AMYLASE in the last 168 hours. No results for input(s): AMMONIA in the last 168 hours. Coagulation Profile: Recent Labs  Lab 01/07/18 2251  INR 1.02   Cardiac Enzymes: No results for input(s): CKTOTAL, CKMB, CKMBINDEX, TROPONINI in the last 168 hours. BNP (last 3 results) No results for input(s): PROBNP in the last 8760 hours. HbA1C: No results for input(s): HGBA1C in the last 72 hours. CBG: No results for input(s): GLUCAP in the last 168 hours. Lipid Profile: No results for input(s): CHOL, HDL, LDLCALC, TRIG, CHOLHDL, LDLDIRECT in the last 72 hours. Thyroid Function Tests: No results for input(s): TSH, T4TOTAL, FREET4, T3FREE, THYROIDAB in the last 72 hours. Anemia Panel: No results for input(s): VITAMINB12, FOLATE, FERRITIN, TIBC, IRON, RETICCTPCT in the last 72 hours. Urine analysis:    Component Value Date/Time   COLORURINE lt. yellow 11/10/2007 1531   APPEARANCEUR Cloudy 11/10/2007 1531   LABSPEC 1.020 11/10/2007 1531   PHURINE 5.0 11/10/2007 1531   HGBUR trace-intact 11/10/2007 1531   BILIRUBINUR negative 11/10/2007 1531   UROBILINOGEN negative 11/10/2007 1531    NITRITE negative  11/10/2007 1531    Creatinine Clearance: Estimated Creatinine Clearance: 36.2 mL/min (by C-G formula based on SCr of 0.99 mg/dL).  Sepsis Labs: @LABRCNTIP (procalcitonin:4,lacticidven:4) )No results found for this or any previous visit (from the past 240 hour(s)).   Radiological Exams on Admission: Ct Head Wo Contrast  Result Date: 01/07/2018 CLINICAL DATA:  Intermittent right arm to mouth numbness with effusion EXAM: CT HEAD WITHOUT CONTRAST TECHNIQUE: Contiguous axial images were obtained from the base of the skull through the vertex without intravenous contrast. COMPARISON:  MRI brain 08/12/2017, CT brain 02/29/2016 FINDINGS: Brain: No acute territorial infarction, hemorrhage or intracranial mass is visualized. Mild atrophy. Extensive hypodensity throughout the periventricular and subcortical white matter. Stable ventricle size. Vascular: No hyperdense vessels. Carotid vessel calcification. Vertebral artery calcification. Skull: Normal. Negative for fracture or focal lesion. Sinuses/Orbits: No acute finding. Other: None IMPRESSION: 1. No definite CT evidence for acute intracranial abnormality. 2. Extensive hypodensity throughout the bilateral white matter, most likely small vessel ischemic change. Electronically Signed   By: Donavan Foil M.D.   On: 01/07/2018 23:16   Mr Brain Wo Contrast (neuro Protocol)  Result Date: 01/08/2018 CLINICAL DATA:  Initial evaluation for intermittent right-sided numbness with aphasia. EXAM: MRI HEAD WITHOUT CONTRAST TECHNIQUE: Multiplanar, multiecho pulse sequences of the brain and surrounding structures were obtained without intravenous contrast. COMPARISON:  Prior CT from 01/07/2018 as well as previous MRI from 08/12/2017. FINDINGS: Brain: Stable atrophy. Extensive T2/FLAIR hyperintensity involving the periventricular, deep, and subcortical white matter both cerebral hemispheres, most consistent with advanced chronic microvascular ischemic  disease. Punctate 5 mm focus of diffusion abnormality within the parasagittal left parietal lobe, consistent with a small acute ischemic infarct (series 3, image 34). Additional punctate cortical infarct within the anterior inferior left frontal lobe (series 3, image 28). Additional 5 mm acute ischemic infarct within the subcortical white matter of the anterior right temporal lobe (series 3, image 16). Additional patchy diffusion abnormality within the subcortical white matter of the right parietal lobe suspicious for acute to subacute small vessel type ischemia (series 3, image 34, 32). No definite associated hemorrhage about these infarcts. No associated mass effect. Extensive chronic micro hemorrhages again seen throughout the brain, most notable within the left parietal region, suggesting cerebral amyloid angiopathy. No definite acute intracranial hemorrhage. No mass lesion, midline shift or mass effect. No hydrocephalus. No extra-axial fluid collection. Major dural sinuses are grossly patent. Pituitary gland suprasellar region normal. Midline structures intact and normal. Vascular: Major intracranial vascular flow voids are maintained. Skull and upper cervical spine: Craniocervical junction within normal limits. Upper cervical spine normal. Bone marrow signal intensity within normal limits. No scalp soft tissue abnormality. Sinuses/Orbits: Globes and orbital soft tissues within normal limits. Paranasal sinuses are clear. No mastoid effusion. Inner ear structures normal. Other: None. IMPRESSION: 1. Scattered punctate subcentimeter acute ischemic infarcts involving the parasagittal left parietal lobe, inferior left frontal lobe, and anterior right temporal lobe as above. No associated hemorrhage or mass effect. 2. Additional small volume mild patchy diffusion abnormality within the subcortical white matter of the right parietal lobe, suspicious for acute to early subacute small vessel type ischemic changes. 3.  Extensive chronic micro hemorrhages throughout the brain, greatest within the left parietal region. Findings suggestive of cerebral amyloid angiopathy. 4. Extensive chronic microvascular ischemic changes. Electronically Signed   By: Jeannine Boga M.D.   On: 01/08/2018 05:51    EKG: Independently reviewed. Sinus rhythm Left anterior fascicular block Probable left ventricular hypertrophy Anterior Q waves, possibly due to  LVH  Assessment/Plan Principal Problem:   CVA (cerebral vascular accident) [facility]) Active Problems:   Hypothyroidism   Dyslipidemia   Essential hypertension   Carotid artery disease (HCC)   COPD (chronic obstructive pulmonary disease) with emphysema ([place])   Coronary artery disease involving native coronary artery of native heart with angina pectoris (HCC)   Right arm numbness   #1. CVA. Patient with expressive aphasia worsening right hand/arm numbness tingling. MRI as noted above. Symptoms resolved at the time of admission. Initial troponin negative. EKG as noted above. Emergency department provider spoke with neurology will see in consultation. -Admit to telemetry -Obtain an MRA -2-D echo, carotid Doppler -Obtain a hemoglobin A1c and lipid panel -Aspirin, statin -Speech therapy, occupational therapy, physical therapy -Heart healthy diet once she passes the bedside swallow eval -Appreciate neurology assistance for further recommendations  #2. Hypertension. Blood pressure slightly elevated on admission. Medications include metoprolol. -We'll hold this for now to allow for expressive aphasia -Monitor  #3. CAD status post stent. Home medications include aspirin 81 mg. Chart review indicates she was also on Plavix in the past but this was discontinued. No chest pain. EKG as noted above. -continue aspirin. -add statin -monitor on tele  4. Right hand/arm numbness. These symptoms have been going on for a while. Chart review indicates she was evaluated by neurology in  October 2018. MRI at that time showed no acute stroke but numerousbilateral CMBs, L>R. Neurology note opined MRI consistant with cerebral amyloid angiopathy and high risk of cortical ICH. EEG was done and Keppra added. See note dated 09/17/17. -Continue Keppra -See #1  5. COPD. Is not on home oxygen. Oxygen saturation level greater than 90% on room air. Home medications does include albuterol inhaler. Chest x-ray pending at time of admission -Monitor oxygen saturation level -Continue albuterol as needed  #6. Tobacco use. -Cessation counseling  #7. Hypothyroidism. Home medications include Synthroid. -Obtain a TSH     DVT prophylaxis: lovenox  Code Status: full  Family Communication: none present  Disposition Plan: home when ready  Consults called: [facility]  Admission status: inpatient    Radene Gunning MD [facility]  If 7PM-7AM, please contact night-coverage [email or web address] Password [email or web address]  01/08/2018, 8:04 AM

## 2018-01-08 NOTE — Progress Notes (Signed)
Pt admitted from ED with stroke diagnosis, alert and oriented, denies any pain nor discomfort, settled in bed with call light within pt's reach, tele monitor put and verified on pt, was however reassured and will continue to monitor, safety concerned addressed as well, v/s stable. [name], Catalina Salasar Efe

## 2018-01-08 NOTE — Consult Note (Signed)
Neurology Consultation Reason for Consult: CVA Referring Physician: [facility] hospital[facility]  CC: right arm numbness and expressive aphasia  History is obtained from:patient and chart review  HPI: [name] is a 77 y.o. female with PMHx of MRI findings consistent with cerebral amyloid angiopathy, CAD s/p stenting on aspirin 81mg  daily, HTN, CHF, HLD, carotid stenosis, tobacco use, acute Takotsubo syndrome, and hypothyroidism who presents to the emergency department with complaint of difficulty speaking.  She also had an episode of her recurrent hand numbness earlier in the day.  Patient reports being in normal state of health last evening when she had about 15-20 minutes of speech difficulty. This occurred when talking to her sister on the telephone after having just woken up from a nap.  She reports she had difficulty expressing what she wanted to say.  She does have a history of intermittent arm numbness but these symptoms have never been accompanied by speech trouble.  She denies any fever, chills, SOB, chest pain, nausea, vomiting, urinary complaints.  She endorses chronic epigastric pain and diarrhea both of which are unchanged.  She follows with Neurology Dr Erlinda Hong as an outpatient and saw him last on 12/24/2017.  She was seen at request of her cardiologist due to her right hand symptoms and abnormal MRI findings.  MRI brain showed no acute stroke but numerous bilateral CMBs, L>R. The MRI consistent with cerebral amyloid angiopathy.  Per Dr Phoebe Sharps note, she has high risk of cortical ICH. At that time, he added Keppra and recommended avoiding antiplatelets or anticoagulation but was okay with continuing Aspirin 81mg  daily given her cardiac history.  Further recommendations included smoking cessation and BP control.  EEG was obtained to rule out seizure which was normal.  In the ED, CT head was negative for bleed.  MRI brain obtained revealed scattered punctate subcentimeter acute ischemic infarcts involving  the parasagittal left parietal lobe, inferior left frontal lobe, and anterior right temporal lobe as above. No associated hemorrhage or mass effect.   LKW: 830 last night tpa given?: no, outside window   NIHSS: 0   ROS: A 14 point ROS was performed and is negative except as noted in the HPI.    Past Medical History:  Diagnosis Date  . Anemia   . Arthritis   . Blood transfusion without reported diagnosis   . Bronchitis    hx of  . CAD (coronary artery disease)    a. BMS to LAD 2008, multivessel disease // b. LHC 6/17 (ant-lat STEMI): LM ok, pLAD 70, mLAD 50, dLAD 60, oD2 70, oLCx 60, pRCA 30, dRCA 50, EF 35-45% with apical ballooning - no culprit for MI and no PCI performed - prob TakoTsubo CM  . Carotid artery disease ([place])    Carotid US 9/35: RICA 70-17%, LICA 7-93% >> follow-up 1 year  . Chronic back pain   . Diverticulitis   . Diverticulosis of colon (without mention of hemorrhage)   . Essential hypertension    Dr. Bluford Kaufmann [phone] at [place]  . Hemorrhage of gastrointestinal tract, unspecified   . History of echocardiogram    a. Echo 05/14/16: Mild focal basal septal hypertrophy, EF 60-65%, normal wall motion, grade 2 diastolic dysfunction, mild TR  . History of non-ST elevation myocardial infarction (NSTEMI) 2008  . History of pneumonia   . Hyperlipidemia   . Hypothyroidism   . 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 Personal history of colonic polyps    Dr.  Ardis Hughs, GI  . Raynaud's syndrome   . Takotsubo cardiomyopathy    a. Admitted 6/17 with anterolateral STEMI >> no culprit on LHC and EF 35-45% with apical ballooning // b. Echo prior to discharge with EF 60-65% and normal wall motion //  c. Limited echo 7/17: EF 55-60%, no RWMA, no pericardial effusion  . Ulcer   . Urinary frequency      Family History  Problem Relation Age of Onset  . Heart disease Mother        Verdie Drown  . Breast cancer Mother   . Lung  cancer Father        SISTER, UNCLE  . Cerebral aneurysm Sister        UNCLE NEPHEW  . Colon cancer Neg Hx   . Esophageal cancer Neg Hx   . Stomach cancer Neg Hx   . Rectal cancer Neg Hx      Social History:  reports that she has been smoking cigarettes.  She has been smoking about 1.00 pack per day for the past 0.00 years. she has never used smokeless tobacco. She reports that she does not drink alcohol or use drugs.   Exam: Current vital signs: BP (!) 166/96   Pulse (!) 59   Resp 18   Ht 5\' 5"  (1.651 m)   Wt 103 lb (46.7 kg)   SpO2 94%   BMI 17.14 kg/m  Vital signs in last 24 hours: Pulse Rate:  [57-68] 59 (02/06 0630) Resp:  [13-24] 18 (02/06 0630) BP: (130-174)/(78-96) 166/96 (02/06 0630) SpO2:  [92 %-98 %] 94 % (02/06 0630) Weight:  [103 lb (46.7 kg)] 103 lb (46.7 kg) (02/05 2222)   Physical Exam  Constitutional: Appears well-developed and well-nourished.  Appears tired.  Psych: Affect appropriate to situation Eyes: No scleral injection HENT: No OP obstrucion Head: Normocephalic.  Cardiovascular: Normal rate and regular rhythm.  Respiratory: Effort normal and breath sounds CTAB GI: Soft.  No distension. There is no tenderness.  Skin: WDI  Neuro: Mental Status: Patient is awake, alert, oriented to person, place, month, year, and situation. Patient is able to give a clear and coherent history. No signs of aphasia or neglect Cranial Nerves: II: Visual Fields are full. Pupils are equal, round, and reactive to light.   III,IV, VI: EOMI without ptosis or diploplia.  V: Facial sensation is symmetric to temperature VII: Facial movement is symmetric.  VIII: hearing is intact to voice X: Uvula elevates symmetrically XI: Shoulder shrug is symmetric. XII: tongue is midline without atrophy or fasciculations.  Motor: Tone is normal. Bulk is normal. 5/5 strength was present in all four extremities.  Sensory: Sensation is symmetric to light touch and temperature in the  arms.  Left leg with some decreased sensation to touch and temperature. Deep Tendon Reflexes: 2+ and symmetric in the patellae.  Cerebellar: Finger to nose intact with essential tremor that is mild   I have reviewed labs in epic and the results pertinent to this consultation are: Troponin 0.00 PT/INR 13.3/1.02 CBC within normal limits Na 141, K 3.9, BUN 13, Cr 0.99   I have reviewed the images obtained: CT head negative for acute bleed MRI Brain: Scattered punctate subcentimeter acute ischemic infarcts involving the parasagittal left parietal lobe, inferior left frontal lobe, and anterior right temporal lobe as above. No associated hemorrhage or mass effect. 2. Additional small volume mild patchy diffusion abnormality within the subcortical white matter of the right parietal lobe, suspicious for acute to early subacute small  vessel type ischemic changes. 3. Extensive chronic micro hemorrhages throughout the brain, greatest within the left parietal region. Findings suggestive of cerebral amyloid angiopathy. 4. Extensive chronic microvascular ischemic changes  MRA Brain: 1. Left PCA occlusion near the P1-P2 junction. 2. 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 Head CTA is recommended for further evaluation of these findings.  Impression:  77 yo woman with hx of CAD s/p stenting on ASA 81mg , episodic right hand numbness, and recent diagnosis of cerebral amyloid angiopathy presents with transient episode of expressive aphasia that has resolved after about 15 minutes.  MRI obtained through the ED with multiple acute infarcts.  Recommendations: 1. HgbA1c, fasting lipid panel 2. Frequent neuro checks 3. Echocardiogram 4. Carotid dopplers 5. Prophylactic therapy-Antiplatelet med: was on Aspirin 81mg  daily prior to admit but  states only taken sporadically.  Will continue 81mg  daily given her CAA and increased risk of ICH with antiplatelets/anticoagulation. 6. Risk factor modification - she is still smoking about 1/2 pack per day of cigarettes 7. Telemetry monitoring 8. PT consult, OT consult, Speech consult 9. Will order CTA head and neck given MRA findings   Jule Ser, PGY3 01/08/2018, 8:52 AM   If 7pm- 7am, please page neurology on call as listed in [place].

## 2018-01-08 NOTE — ED Notes (Signed)
Pt to xray

## 2018-01-08 NOTE — ED Notes (Signed)
Pt remains in mri.

## 2018-01-08 NOTE — ED Notes (Signed)
Pt back from MRI 

## 2018-01-08 NOTE — ED Notes (Signed)
Pharmacy notified to send daily medications to [facility]

## 2018-01-08 NOTE — Evaluation (Signed)
Physical Therapy Evaluation Patient Details Name: [name] MRN: [id number] DOB: [birth year] Today's Date: 01/08/2018   History of Present Illness  [name] is a 77 y.o. female with medical history significant for CAD, carotid artery disease, chronic back pain, hypertension, hyperlipidemia, hypothyroidism, takotsubo cardiomyopathy presents to the emergency department with the chief complaint of difficulty speaking accompanied with a right hand numbness and weakness. Initial evaluation included an MRI brain revealing acute infarct  Clinical Impression  Pt functioning near baseline. Pt with noted word finding difficulties and R UE weakness and numbness. Pt with good home set up and support. Acute PT to con't to follow.    Follow Up Recommendations Outpatient PT;Supervision/Assistance - 24 hour(may progress and not need any PT follow)    Equipment Recommendations  None recommended by PT    Recommendations for Other Services       Precautions / Restrictions Precautions Precautions: Fall Restrictions Weight Bearing Restrictions: No      Mobility  Bed Mobility Overal bed mobility: Modified Independent             General bed mobility comments: used bed rail but no physical assist getting in/out of bed  Transfers Overall transfer level: Needs assistance Equipment used: None Transfers: Sit to/from Stand Sit to Stand: Min guard         General transfer comment: pt with good technique, increased time but safe  Ambulation/Gait Ambulation/Gait assistance: Min guard Ambulation Distance (Feet): 200 Feet Assistive device: None Gait Pattern/deviations: Step-through pattern;Decreased stride length;Narrow base of support Gait velocity: slow Gait velocity interpretation: Below normal speed for age/gender General Gait Details: pt with near shuffle pattern, son reports this close to baseline, pt held arms crossed holding opposite elbows. no overt episode of  LOB  Stairs Stairs: Yes Stairs assistance: Min guard Stair Management: One rail Right Number of Stairs: 2 General stair comments: pt unable to clear foot on step, didn't fall but did require 2 hands on the rail  Wheelchair Mobility    Modified Rankin (Stroke Patients Only) Modified Rankin (Stroke Patients Only) Pre-Morbid Rankin Score: No symptoms Modified Rankin: Slight disability     Balance Overall balance assessment: Needs assistance Sitting-balance support: Feet supported;No upper extremity supported Sitting balance-Leahy Scale: Good Sitting balance - Comments: pt able to reach down and pull up socks   Standing balance support: No upper extremity supported Standing balance-Leahy Scale: Fair                   Standardized Balance Assessment Standardized Balance Assessment : Dynamic Gait Index   Dynamic Gait Index Level Surface: Mild Impairment Change in Gait Speed: Mild Impairment Gait with Horizontal Head Turns: Mild Impairment Gait with Vertical Head Turns: Mild Impairment Gait and Pivot Turn: Mild Impairment Step Over Obstacle: Mild Impairment Step Around Obstacles: Mild Impairment Steps: Mild Impairment Total Score: 16       Pertinent Vitals/Pain Pain Assessment: No/denies pain    Home Living Family/patient expects to be discharged to:: Private residence Living Arrangements: Children;Spouse/significant other(spouse and son) Available Help at Discharge: Family;Available 24 hours/day(son works but states he'll make arrangements) Type of Home: House Home Access: Stairs to enter Entrance Stairs-Rails: Right Entrance Stairs-Number of Steps: 3 Home Layout: One level Home Equipment: None      Prior Function Level of Independence: Independent         Comments: drives, cooks, cleans, spouse needs some assist      Hand Dominance   Dominant Hand: Right  Extremity/Trunk Assessment   Upper Extremity Assessment Upper Extremity Assessment:  RUE deficits/detail RUE Deficits / Details: grossly 4/5, with 25% sensory deficits    Lower Extremity Assessment Lower Extremity Assessment: Overall WFL for tasks assessed    Cervical / Trunk Assessment Cervical / Trunk Assessment: Normal  Communication   Communication: Expressive difficulties  Cognition Arousal/Alertness: Awake/alert Behavior During Therapy: Flat affect Overall Cognitive Status: Impaired/Different from baseline Area of Impairment: Attention;Memory;Following commands;Problem solving                   Current Attention Level: Selective Memory: Decreased short-term memory(recalled 1/3 items on mini mental exam) Following Commands: Follows one step commands with increased time     Problem Solving: Slow processing        General Comments      Exercises     Assessment/Plan    PT Assessment Patient needs continued PT services  PT Problem List Decreased strength;Decreased range of motion;Decreased activity tolerance;Decreased balance;Decreased mobility;Decreased coordination;Decreased cognition;Decreased knowledge of use of DME;Decreased safety awareness       PT Treatment Interventions DME instruction;Gait training;Stair training;Functional mobility training;Therapeutic activities;Therapeutic exercise;Balance training;Neuromuscular re-education;Cognitive remediation    PT Goals (Current goals can be found in the Care Plan section)  Acute Rehab PT Goals Patient Stated Goal: home PT Goal Formulation: With patient Time For Goal Achievement: 01/22/18 Potential to Achieve Goals: Good    Frequency Min 4X/week   Barriers to discharge        Co-evaluation               AM-PAC PT "6 Clicks" Daily Activity  Outcome Measure Difficulty turning over in bed (including adjusting bedclothes, sheets and blankets)?: None Difficulty moving from lying on back to sitting on the side of the bed? : None Difficulty sitting down on and standing up from a  chair with arms (e.g., wheelchair, bedside commode, etc,.)?: None Help needed moving to and from a bed to chair (including a wheelchair)?: A Little Help needed walking in hospital room?: A Little Help needed climbing 3-5 steps with a railing? : A Little 6 Click Score: 21    End of Session Equipment Utilized During Treatment: Gait belt Activity Tolerance: Patient tolerated treatment well Patient left: in bed;with call bell/phone within reach;with family/visitor present Nurse Communication: Mobility status PT Visit Diagnosis: Unsteadiness on feet (R26.81)    Time: 3244-0102 PT Time Calculation (min) (ACUTE ONLY): 27 min   Charges:   PT Evaluation $PT Eval Moderate Complexity: 1 Mod PT Treatments $Gait Training: 8-22 mins   PT G Codes:        Kittie Plater, PT, DPT Pager #: [phone] Office #: [phone]   [place] 01/08/2018, 2:25 PM

## 2018-01-08 NOTE — Progress Notes (Signed)
*  PRELIMINARY RESULTS* Vascular Ultrasound Carotid Duplex (Doppler) has been completed.   Findings suggest 1-39% internal carotid artery stenosis bilaterally. Vertebral arteries are patent with antegrade flow.  01/08/2018 9:00 AM Maudry Mayhew, BS, RVT, RDCS, RDMS

## 2018-01-08 NOTE — ED Notes (Signed)
Pt returned to room, admitting at bedside

## 2018-01-08 NOTE — ED Notes (Signed)
Pt to mri from xray

## 2018-01-08 NOTE — ED Notes (Signed)
Patient transported to CT 

## 2018-01-08 NOTE — ED Notes (Signed)
Dinner tray ordered for pt

## 2018-01-08 NOTE — ED Notes (Signed)
[facility] in [phone] Kev[facility] Ny in [phone]

## 2018-01-08 NOTE — Progress Notes (Signed)
  Echocardiogram 2D Echocardiogram has been performed.  [name] L [name] 01/08/2018, 9:30 AM

## 2018-01-09 ENCOUNTER — Other Ambulatory Visit: Payer: Self-pay

## 2018-01-09 ENCOUNTER — Encounter (HOSPITAL_COMMUNITY): Payer: Self-pay | Admitting: *Deleted

## 2018-01-09 DIAGNOSIS — I10 Essential (primary) hypertension: Secondary | ICD-10-CM

## 2018-01-09 DIAGNOSIS — J432 Centrilobular emphysema: Secondary | ICD-10-CM

## 2018-01-09 DIAGNOSIS — E785 Hyperlipidemia, unspecified: Secondary | ICD-10-CM

## 2018-01-09 DIAGNOSIS — I25119 Atherosclerotic heart disease of native coronary artery with unspecified angina pectoris: Secondary | ICD-10-CM

## 2018-01-09 DIAGNOSIS — I6300 Cerebral infarction due to thrombosis of unspecified precerebral artery: Secondary | ICD-10-CM

## 2018-01-09 MED ORDER — NICOTINE 14 MG/24HR TD PT24
14.0000 mg | MEDICATED_PATCH | Freq: Every day | TRANSDERMAL | Status: DC
  Administered 2018-01-09 – 2018-01-10 (×2): 14 mg via TRANSDERMAL
  Filled 2018-01-09 (×2): qty 1

## 2018-01-09 NOTE — Progress Notes (Signed)
Occupational Therapy Evaluation Patient Details Name: [name] MRN: [id number] DOB: 15-Jul-1941 Today's Date: 01/09/2018    History of Present Illness [name] is a 77 y.o. female with medical history significant for CAD, carotid artery disease, chronic back pain, hypertension, hyperlipidemia, hypothyroidism, takotsubo cardiomyopathy presents to the emergency department with the chief complaint of difficulty speaking accompanied with a right hand numbness and weakness. Initial evaluation included an MRI brain revealing acute infarct   Clinical Impression   PTA, pt independent with mobility and ADL and was active, including her ability to drive. Pt currently requires min A today with mobility and LB ADL due to LOB during session due to below listed deficits. Began education on home safety and reducing risk of falls. Son states family can assist as needed after DC.  Recommend follow up with [facility]. Will follow acutely.     Follow Up Recommendations  Home health OT;Supervision/Assistance - 24 hour(S with mobility)    Equipment Recommendations  3 in 1 bedside commode    Recommendations for Other Services       Precautions / Restrictions Precautions Precautions: Fall Restrictions Weight Bearing Restrictions: No      Mobility Bed Mobility Overal bed mobility: Modified Independent             General bed mobility comments: used bed rail but no physical assist getting in/out of bed  Transfers Overall transfer level: Needs assistance Equipment used: None Transfers: Sit to/from Stand Sit to Stand: Min guard         General transfer comment: pt with good technique, increased time but safe    Balance Overall balance assessment: Needs assistance Sitting-balance support: Feet supported;No upper extremity supported Sitting balance-Leahy Scale: Good Sitting balance - Comments: pt able to reach down and pull up socks   Standing balance support: No upper extremity  supported Standing balance-Leahy Scale: Poor Standing balance comment: demosntrated LOB x 3                            ADL either performed or assessed with clinical judgement   ADL Overall ADL's : Needs assistance/impaired     Grooming: Set up;Standing   Upper Body Bathing: Set up;Sitting   Lower Body Bathing: Min guard;Sit to/from stand   Upper Body Dressing : Set up;Sitting   Lower Body Dressing: Min guard;Sit to/from stand   Toilet Transfer: Minimal assistance Toilet Transfer Details (indicate cue type and reason): LOB x 3 with min A to recover; assessed with RW; demonstrates poor orientation of self in RW. Will further assess         Functional mobility during ADLs: Minimal assistance General ADL Comments: Educated son on need for pt to use 3in1 as shower chair in walk in shower and for her to have S when getting/ in/out of shower to reduce risk of falls. Son verbalized understanidng.      Vision Baseline Vision/History: Wears glasses Vision Assessment?: Yes Eye Alignment: Within Functional Limits Ocular Range of Motion: Within Functional Limits Alignment/Gaze Preference: Within Defined Limits Tracking/Visual Pursuits: Decreased smoothness of horizontal tracking Saccades: Additional eye shifts occurred during testing Convergence: Within functional limits Visual Fields: No apparent deficits Additional Comments: Pt states that her vision is "not different". appears to track targets in all quadrants; Able to reac without difficulty; will further assess     Perception Perception Comments: Poor orientation of self in space wtih use of RW   Praxis  Pertinent Vitals/Pain Pain Assessment: No/denies pain     Hand Dominance Right   Extremity/Trunk Assessment Upper Extremity Assessment Upper Extremity Assessment: RUE deficits/detail RUE Deficits / Details: states "it just feels different"; Isolated arm movements with funcitonal ROM; noted decreased  coordination, but grossly functional RUE Sensation: decreased light touch RUE Coordination: decreased fine motor   Lower Extremity Assessment Lower Extremity Assessment: Defer to PT evaluation   Cervical / Trunk Assessment Cervical / Trunk Assessment: Normal   Communication Communication Communication: Expressive difficulties   Cognition Arousal/Alertness: Awake/alert Behavior During Therapy: Flat affect Overall Cognitive Status: Impaired/Different from baseline Area of Impairment: Attention;Memory;Following commands;Problem solving;Safety/judgement;Awareness                   Current Attention Level: Selective Memory: Decreased short-term memory Following Commands: Follows one step commands consistently Safety/Judgement: Decreased awareness of safety Awareness: Emergent Problem Solving: Slow processing General Comments: Will further assess; increased time for processing   General Comments       Exercises     Shoulder Instructions      Home Living Family/patient expects to be discharged to:: Private residence Living Arrangements: Spouse/significant other;Children Available Help at Discharge: Family;Available 24 hours/day(son works but states he'll make arrangements) Type of Home: House Home Access: Stairs to enter Technical brewer of Steps: 3 Entrance Stairs-Rails: Right Home Layout: One level     Bathroom Shower/Tub: Teacher, early years/pre: Standard Bathroom Accessibility: Yes How Accessible: Accessible via walker Home Equipment: None          Prior Functioning/Environment Level of Independence: Independent        Comments: drives, cooks, cleans, spouse needs some assist (states she has "balance issues")        OT Problem List: Decreased strength;Decreased activity tolerance;Impaired balance (sitting and/or standing);Decreased coordination;Decreased safety awareness      OT Treatment/Interventions: Self-care/ADL  training;Therapeutic exercise;DME and/or AE instruction;Therapeutic activities;Visual/perceptual remediation/compensation;Patient/family education;Balance training    OT Goals(Current goals can be found in the care plan section) Acute Rehab OT Goals Patient Stated Goal: home OT Goal Formulation: With patient Time For Goal Achievement: 01/23/18 Potential to Achieve Goals: Good  OT Frequency: Min 2X/week   Barriers to D/C:            Co-evaluation              AM-PAC PT "6 Clicks" Daily Activity     Outcome Measure Help from another person eating meals?: None Help from another person taking care of personal grooming?: None Help from another person toileting, which includes using toliet, bedpan, or urinal?: A Little Help from another person bathing (including washing, rinsing, drying)?: A Little Help from another person to put on and taking off regular upper body clothing?: None Help from another person to put on and taking off regular lower body clothing?: A Little 6 Click Score: 21   End of Session Equipment Utilized During Treatment: Gait belt Nurse Communication: Mobility status  Activity Tolerance: Patient tolerated treatment well Patient left: in bed;with call bell/phone within reach;with bed alarm set;with family/visitor present  OT Visit Diagnosis: Unsteadiness on feet (R26.81);Other symptoms and signs involving cognitive function;Muscle weakness (generalized) (M62.81)                Time: 2202-5427 OT Time Calculation (min): 21 min Charges:  OT General Charges $OT Visit: 1 Visit OT Evaluation $OT Eval Low Complexity: 1 Low G-Codes:     Prentiss, OT/L  [phone] 01/09/2018  [name],[name] 01/09/2018, 3:08 PM

## 2018-01-09 NOTE — Progress Notes (Signed)
NEUROHOSPITALISTS STROKE TEAM - DAILY PROGRESS NOTE   ADMISSION HISTORY: [name] is a 77 y.o. female with PMHx of MRI findings consistent with cerebral amyloid angiopathy, CAD s/p stenting on aspirin 81mg  daily, HTN, CHF, HLD, carotid stenosis, tobacco use, acute Takotsubo syndrome, and hypothyroidism who presents to the emergency department with complaint of difficulty speaking.  She also had an episode of her recurrent hand numbness earlier in the day.  Patient reports being in normal state of health last evening when she had about 15-20 minutes of speech difficulty. This occurred when talking to her sister on the telephone after having just woken up from a nap.  She reports she had difficulty expressing what she wanted to say.  She does have a history of intermittent arm numbness but these symptoms have never been accompanied by speech trouble.  She denies any fever, chills, SOB, chest pain, nausea, vomiting, urinary complaints.  She endorses chronic epigastric pain and diarrhea both of which are unchanged.  She follows with Neurology Dr Erlinda Hong as an outpatient and saw him last on 12/24/2017.  She was seen at request of her cardiologist due to her right hand symptoms and abnormal MRI findings.  MRI brain showed no acute stroke but numerous bilateral CMBs, L>R. The MRI consistent with cerebral amyloid angiopathy.  Per Dr Phoebe Sharps note, she has high risk of cortical ICH. At that time, he added Keppra and recommended avoiding antiplatelets or anticoagulation but was okay with continuing Aspirin 81mg  daily given her cardiac history.  Further recommendations included smoking cessation and BP control.  EEG was obtained to rule out seizure which was normal.  In the ED, CT head was negative for bleed.  MRI brain obtained revealed scattered punctate subcentimeter acute ischemic infarcts involving the parasagittal left parietal lobe, inferior left frontal lobe, and  anterior right temporal lobe as above. No associated hemorrhage or mass effect.  LKW: 830 last night tpa given?: no, outside window NIHSS: 0  SUBJECTIVE (INTERVAL HISTORY) Son is at the bedside. Patient is found laying in bed in NAD. Overall she feels her condition is gradually improving. Voices no new complaints. No new events reported overnight. Patient is a very poor historian but can remember most of yesterdays event just not in great detail. Likely has mild dementia at baseline.  Patient states she "always" has a H/A and does not think that her normal H/A was any worse yesterday. She admits to not taking her home medications consistently. She admits to smoking a half a pack per day. She is currently requesting a Nicoderm patch.   OBJECTIVE Lab Results: CBC:  Recent Labs  Lab 01/07/18 2247 01/07/18 2251  WBC  --  5.4  HGB 13.9 13.7  HCT 41.0 43.0  MCV  --  98.9  PLT  --  177   BMP: Recent Labs  Lab 01/07/18 2247 01/07/18 2251  NA 142 141  K 3.7 3.9  CL 101 103  CO2  --  29  GLUCOSE 102* 109*  BUN 14 13  CREATININE 1.00 0.99  CALCIUM  --  9.2   Liver Function Tests:  Recent Labs  Lab 01/07/18 2251  AST 19  ALT 13*  ALKPHOS 75  BILITOT 0.6  PROT 5.6*  ALBUMIN 3.6   Thyroid Function Studies:  Recent Labs    01/07/18 2251  TSH 3.557   Coagulation Studies:  Recent Labs    01/07/18 2251  APTT 30  INR 1.02   Urinalysis:  Recent Labs  Lab 01/08/18 [place] 1.008  PHURINE 7.0  GLUCOSEU NEGATIVE  HGBUR NEGATIVE  BILIRUBINUR NEGATIVE  KETONESUR NEGATIVE  PROTEINUR NEGATIVE  NITRITE NEGATIVE  LEUKOCYTESUR NEGATIVE   PHYSICAL EXAM Temp:  [98.2 F (36.8 C)-98.8 F (37.1 C)] 98.2 F (36.8 C) (02/07 0955) Pulse Rate:  [54-77] 62 (02/07 0955) Resp:  [16-24] 17 (02/07 0955) BP: (111-176)/(74-99) 149/90 (02/07 0955) SpO2:  [93 %-100 %] 96 % (02/07 0955) Weight:  [46.5 kg (102 lb 8.2 oz)] 46.5 kg (102 lb  8.2 oz) (02/06 2000) General - frail elderly Caucasian lady in no apparent distress HEENT-  Normocephalic,  Cardiovascular - Regular rate and rhythm  Respiratory - Lungs clear bilaterally. No wheezing. Abdomen - soft and non-tender, BS normal Extremities- no edema or cyanosis Neuro: Mental Status: Patient is awake, alert, oriented to person, place, month, year, and situation. Patient is able to give a clear and coherent history. No signs of aphasia or neglect Cranial Nerves: II: Visual Fields are full. Pupils are equal, round, and reactive to light.   III,IV, VI: EOMI without ptosis or diploplia.  V: Facial sensation is symmetric to temperature VII: Facial movement is symmetric.  VIII: hearing is intact to voice X: Uvula elevates symmetrically XI: Shoulder shrug is symmetric. XII: tongue is midline without atrophy or fasciculations.  Motor: Tone is normal. Bulk is normal. 5/5 strength was present in all four extremities.  Sensory: Sensation is symmetric to light touch and temperature in the arms.  Left leg with some decreased sensation to touch and temperature. Deep Tendon Reflexes: 2+ and symmetric in the patellae.  Cerebellar: Finger to nose intact with essential tremor that is mild   IMAGING: I have personally reviewed the radiological images below and agree with the radiology interpretations. Ct Angio Head W Or Wo Contrast  Result Date: 01/08/2018 CLINICAL DATA:  CVA. Scattered punctate acute subcortical infarcts. Left PCA occlusion. Anterior communicating artery aneurysm. EXAM: CT ANGIOGRAPHY HEAD AND NECK TECHNIQUE: Multidetector CT imaging of the head and neck was performed using the standard protocol during bolus administration of intravenous contrast. Multiplanar CT image reconstructions and MIPs were obtained to evaluate the vascular anatomy. Carotid stenosis measurements (when applicable) are obtained utilizing NASCET criteria, using the distal internal carotid diameter  as the denominator. CONTRAST:  58mL ISOVUE-370 IOPAMIDOL (ISOVUE-370) INJECTION 76% COMPARISON:  MRI brain and MRA head 01/08/2018. CT chest 04/25/2015. FINDINGS: CTA NECK FINDINGS Aortic arch: A 3 vessel arch configuration is present. Atherosclerotic calcifications are present at the aortic arch without aneurysm or significant stenosis at the great vessel origins. Right carotid system: Atherosclerotic calcifications are present along the course of the right common carotid artery. Dense calcifications are present at the right carotid bifurcation without a significant stenosis relative to the more distal vessel. Cervical right ICA mildly tortuous without significant stenosis. Left carotid system: The left common carotid artery also demonstrates extensive atherosclerotic calcification without a significant stenosis. Atherosclerotic calcification is present at the left carotid bifurcation. There is mild narrowing of the left external carotid artery. There is no significant stenosis of the left internal carotid artery relative to the more distal vessel. Mild tortuosity is present. Vertebral arteries: The vertebral arteries are codominant. Both vertebral arteries originate from the subclavian arteries without significant stenosis. Atherosclerotic calcifications are present in the proximal left vertebral artery. There is no focal stenosis of either vertebral artery within the neck. Skeleton: Grade 1 anterolisthesis present at C4-5. Chronic loss of disc height and endplate  changes present C5-6 and C6-7 with osseous foraminal narrowing greatest at C5-6, right greater than left. No focal lytic or blastic lesions are present. Vertebral body heights are maintained. Other neck: No focal mucosal or submucosal lesions are present. Epiglottis is within normal limits. The hypopharynx is clear. Vocal cords are midline and symmetric. Trachea is unremarkable. Upper chest: Centrilobular emphysematous changes are present. A 5 mm nodule  in the left upper lobe is stable since 2016. No new nodules are present. Review of the MIP images confirms the above findings CTA HEAD FINDINGS Anterior circulation: The internal carotid arteries demonstrate atherosclerotic changes within the cavernous segments. There is no significant stenosis relative to the more distal vessels. The ICA termini are within limits bilaterally. The A1 and M1 segments are normal. There is mild narrowing of the proximal left M1 segment the more distal left M1 segment is normal. A 2 mm posteriorly and superiorly directed aneurysm is present at the left A1-A2 junction. No other focal aneurysm is present. MCA bifurcations are intact bilaterally. ACA and MCA branch vessels are within normal limits. Posterior circulation: The vertebral arteries are codominant. Atherosclerotic calcifications are present along both vertebral arteries. The right PICA origin is visualized and normal. The left PICA originates below the dural margin. The basilar artery is mildly irregular without a significant stenosis. The left posterior cerebral artery is occluded proximally. The right PCA branch vessels are normal. There is marked attenuation of the left posterior cerebral artery. Venous sinuses: The dural sinuses are patent. The straight sinus deep cerebral veins are intact. Cortical veins are unremarkable. Anatomic variants: None. Delayed phase: The postcontrast images accentuate white matter disease. No pathologic enhancement is present. Review of the MIP images confirms the above findings IMPRESSION: 1. Stable appearance of left PCA occlusion proximally. Collateral vessels are noted. 2. Atherosclerotic changes at the carotid bifurcations bilaterally without significant stenosis. 3. Additional atherosclerotic calcifications present at the aortic arch and cavernous internal carotid artery without a significant stenosis. 4. Mild narrowing of the proximal left M1 segment rather than aneurysmal dilation of the  more distal left M1 segment. 5. 2 mm posteriorly and superiorly directed aneurysm from the left A1-A2 junction. Electronically Signed   By: San Morelle M.D.   On: 01/08/2018 12:15   Dg Chest 2 View  Result Date: 01/08/2018 CLINICAL DATA:  Aphasic with right upper extremity numbness EXAM: CHEST  2 VIEW COMPARISON:  May 15, 2017 FINDINGS: Lungs are mildly hyperexpanded. There is no edema or consolidation. Heart size and pulmonary vascularity are normal. No adenopathy. There is an evident coronary artery stent on the left. There is aortic atherosclerosis. No bone lesions evident. IMPRESSION: Aortic atherosclerosis. Lungs mildly hyperexpanded without edema or consolidation. Aortic Atherosclerosis (ICD10-I70.0). Electronically Signed   By: Lowella Grip III M.D.   On: 01/08/2018 08:02   Ct Head Wo Contrast  Result Date: 01/07/2018 CLINICAL DATA:  Intermittent right arm to mouth numbness with effusion EXAM: CT HEAD WITHOUT CONTRAST TECHNIQUE: Contiguous axial images were obtained from the base of the skull through the vertex without intravenous contrast. COMPARISON:  MRI brain 08/12/2017, CT brain 02/29/2016 FINDINGS: Brain: No acute territorial infarction, hemorrhage or intracranial mass is visualized. Mild atrophy. Extensive hypodensity throughout the periventricular and subcortical white matter. Stable ventricle size. Vascular: No hyperdense vessels. Carotid vessel calcification. Vertebral artery calcification. Skull: Normal. Negative for fracture or focal lesion. Sinuses/Orbits: No acute finding. Other: None IMPRESSION: 1. No definite CT evidence for acute intracranial abnormality. 2. Extensive hypodensity throughout the bilateral  white matter, most likely small vessel ischemic change. Electronically Signed   By: Donavan Foil M.D.   On: 01/07/2018 23:16   Ct Angio Neck W Or Wo Contrast  Result Date: 01/08/2018 CLINICAL DATA:  CVA. Scattered punctate acute subcortical infarcts. Left PCA  occlusion. Anterior communicating artery aneurysm. EXAM: CT ANGIOGRAPHY HEAD AND NECK TECHNIQUE: Multidetector CT imaging of the head and neck was performed using the standard protocol during bolus administration of intravenous contrast. Multiplanar CT image reconstructions and MIPs were obtained to evaluate the vascular anatomy. Carotid stenosis measurements (when applicable) are obtained utilizing NASCET criteria, using the distal internal carotid diameter as the denominator. CONTRAST:  58mL ISOVUE-370 IOPAMIDOL (ISOVUE-370) INJECTION 76% COMPARISON:  MRI brain and MRA head 01/08/2018. CT chest 04/25/2015. FINDINGS: CTA NECK FINDINGS Aortic arch: A 3 vessel arch configuration is present. Atherosclerotic calcifications are present at the aortic arch without aneurysm or significant stenosis at the great vessel origins. Right carotid system: Atherosclerotic calcifications are present along the course of the right common carotid artery. Dense calcifications are present at the right carotid bifurcation without a significant stenosis relative to the more distal vessel. Cervical right ICA mildly tortuous without significant stenosis. Left carotid system: The left common carotid artery also demonstrates extensive atherosclerotic calcification without a significant stenosis. Atherosclerotic calcification is present at the left carotid bifurcation. There is mild narrowing of the left external carotid artery. There is no significant stenosis of the left internal carotid artery relative to the more distal vessel. Mild tortuosity is present. Vertebral arteries: The vertebral arteries are codominant. Both vertebral arteries originate from the subclavian arteries without significant stenosis. Atherosclerotic calcifications are present in the proximal left vertebral artery. There is no focal stenosis of either vertebral artery within the neck. Skeleton: Grade 1 anterolisthesis present at C4-5. Chronic loss of disc height and  endplate changes present C5-6 and C6-7 with osseous foraminal narrowing greatest at C5-6, right greater than left. No focal lytic or blastic lesions are present. Vertebral body heights are maintained. Other neck: No focal mucosal or submucosal lesions are present. Epiglottis is within normal limits. The hypopharynx is clear. Vocal cords are midline and symmetric. Trachea is unremarkable. Upper chest: Centrilobular emphysematous changes are present. A 5 mm nodule in the left upper lobe is stable since 2016. No new nodules are present. Review of the MIP images confirms the above findings CTA HEAD FINDINGS Anterior circulation: The internal carotid arteries demonstrate atherosclerotic changes within the cavernous segments. There is no significant stenosis relative to the more distal vessels. The ICA termini are within limits bilaterally. The A1 and M1 segments are normal. There is mild narrowing of the proximal left M1 segment the more distal left M1 segment is normal. A 2 mm posteriorly and superiorly directed aneurysm is present at the left A1-A2 junction. No other focal aneurysm is present. MCA bifurcations are intact bilaterally. ACA and MCA branch vessels are within normal limits. Posterior circulation: The vertebral arteries are codominant. Atherosclerotic calcifications are present along both vertebral arteries. The right PICA origin is visualized and normal. The left PICA originates below the dural margin. The basilar artery is mildly irregular without a significant stenosis. The left posterior cerebral artery is occluded proximally. The right PCA branch vessels are normal. There is marked attenuation of the left posterior cerebral artery. Venous sinuses: The dural sinuses are patent. The straight sinus deep cerebral veins are intact. Cortical veins are unremarkable. Anatomic variants: None. Delayed phase: The postcontrast images accentuate white matter disease. No pathologic  enhancement is present. Review of  the MIP images confirms the above findings IMPRESSION: 1. Stable appearance of left PCA occlusion proximally. Collateral vessels are noted. 2. Atherosclerotic changes at the carotid bifurcations bilaterally without significant stenosis. 3. Additional atherosclerotic calcifications present at the aortic arch and cavernous internal carotid artery without a significant stenosis. 4. Mild narrowing of the proximal left M1 segment rather than aneurysmal dilation of the more distal left M1 segment. 5. 2 mm posteriorly and superiorly directed aneurysm from the left A1-A2 junction. Electronically Signed   By: San Morelle M.D.   On: 01/08/2018 12:15   Mr Brain Wo Contrast (neuro Protocol)  Result Date: 01/08/2018 CLINICAL DATA:  Initial evaluation for intermittent right-sided numbness with aphasia. EXAM: MRI HEAD WITHOUT CONTRAST TECHNIQUE: Multiplanar, multiecho pulse sequences of the brain and surrounding structures were obtained without intravenous contrast. COMPARISON:  Prior CT from 01/07/2018 as well as previous MRI from 08/12/2017. FINDINGS: Brain: Stable atrophy. Extensive T2/FLAIR hyperintensity involving the periventricular, deep, and subcortical white matter both cerebral hemispheres, most consistent with advanced chronic microvascular ischemic disease. Punctate 5 mm focus of diffusion abnormality within the parasagittal left parietal lobe, consistent with a small acute ischemic infarct (series 3, image 34). Additional punctate cortical infarct within the anterior inferior left frontal lobe (series 3, image 28). Additional 5 mm acute ischemic infarct within the subcortical white matter of the anterior right temporal lobe (series 3, image 16). Additional patchy diffusion abnormality within the subcortical white matter of the right parietal lobe suspicious for acute to subacute small vessel type ischemia (series 3, image 34, 32). No definite associated hemorrhage about these infarcts. No associated mass  effect. Extensive chronic micro hemorrhages again seen throughout the brain, most notable within the left parietal region, suggesting cerebral amyloid angiopathy. No definite acute intracranial hemorrhage. No mass lesion, midline shift or mass effect. No hydrocephalus. No extra-axial fluid collection. Major dural sinuses are grossly patent. Pituitary gland suprasellar region normal. Midline structures intact and normal. Vascular: Major intracranial vascular flow voids are maintained. Skull and upper cervical spine: Craniocervical junction within normal limits. Upper cervical spine normal. Bone marrow signal intensity within normal limits. No scalp soft tissue abnormality. Sinuses/Orbits: Globes and orbital soft tissues within normal limits. Paranasal sinuses are clear. No mastoid effusion. Inner ear structures normal. Other: None. IMPRESSION: 1. Scattered punctate subcentimeter acute ischemic infarcts involving the parasagittal left parietal lobe, inferior left frontal lobe, and anterior right temporal lobe as above. No associated hemorrhage or mass effect. 2. Additional small volume mild patchy diffusion abnormality within the subcortical white matter of the right parietal lobe, suspicious for acute to early subacute small vessel type ischemic changes. 3. Extensive chronic micro hemorrhages throughout the brain, greatest within the left parietal region. Findings suggestive of cerebral amyloid angiopathy. 4. Extensive chronic microvascular ischemic changes. Electronically Signed   By: Jeannine Boga M.D.   On: 01/08/2018 05:51   Mr Jodene Nam Head Wo Contrast  Result Date: 01/08/2018 CLINICAL DATA:  Right hand numbness and weakness. Difficulty speaking. Scattered punctate infarcts in both cerebral hemispheres on MRI EXAM: MRA HEAD WITHOUT CONTRAST TECHNIQUE: Angiographic images of the Circle of Willis were obtained using MRA technique without intravenous contrast. COMPARISON:  None. FINDINGS: The visualized distal  vertebral arteries are patent to the basilar and codominant. Patent PICA and SCA origins are identified bilaterally. The basilar artery is patent with an irregular and mildly ectatic appearance proximally. No significant basilar artery stenosis is identified. Posterior communicating arteries are not identified and may  be diminutive or absent. The right PCA is patent without significant proximal stenosis. The left PCA occludes near the P1-P2 junction with some reconstitution of thready/diseased appearing distal branches. The internal carotid arteries are widely patent from skull base to carotid termini. The ACAs and MCAs are patent without evidence of proximal branch occlusion or significant proximal stenosis. There is mild fusiform dilatation of the mid left M1 segment to 3.5 mm diameter, and there is also mild fusiform dilatation of the right MCA at the bifurcation to 4 mm diameter. Mild motion artifact is present in both of these regions which may accentuate the apparent dilatation. There is a 3 x 2 mm aneurysm projecting posteriorly, medially, and superiorly from the left A1-A2 junction. IMPRESSION: 1. Left PCA occlusion near the P1-P2 junction. 2. 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 Head CTA is recommended for further evaluation of these findings. Electronically Signed   By: Logan Bores M.D.   On: 01/08/2018 08:41   Vas US Carotid (at [place] Only) 1-39% internal carotid artery stenosis bilaterally. Vertebral arteries are patent with antegrade flow.  Echocardiogram:                                               Study Conclusions - Left ventricle: The cavity size was normal. Wall thickness was   normal. Systolic function was vigorous. The estimated ejection   fraction was in the range of 65% to 70%. Wall motion was normal;   there were no  regional wall motion abnormalities. - Mitral valve: There was mild regurgitation. - Tricuspid valve: There was mild-moderate regurgitation. - Pulmonary arteries: PA peak pressure: 31 mm Hg (S).     IMPRESSION: [name] is a 77 y.o. female with PMH of  CAD s/p stenting on ASA 81mg , episodic right hand numbness, and recent diagnosis of cerebral amyloid angiopathy presents with transient episode of expressive aphasia that has resolved after about 15 minutes.  MRI reveals:  Scattered punctate subcentimeter acute ischemic infarcts involving the parasagittal left parietal lobe, inferior left frontal lobe, and anterior right temporal lobe Left PCA occlusion near the P1-P2 junction   Suspected Etiology: Likely small vessel disease Resultant Symptoms: Dysarthria and Right hand numbness Stroke Risk Factors: hyperlipidemia and hypertension Other Stroke Risk Factors: Advanced age, Cigarette smoker, CHF, CAD  Outstanding Stroke Work-up Studies:    Work up is completed  01/09/2018 ASSESSMENT:   ABCD 2 Score = 6 Neuro exam non-focal today. Stroke work up completed. Likely to be discharged home later today. Imaging/labs/Dx of CAA and POC reviewed with patient and son at bedside.  PLAN  01/09/2018: Continue Aspirin/ Statin Only ASA 81mg  daily given her CAA and increased risk of ICH with antiplatelets/AC Frequent neuro checks Telemetry monitoring PT/OT/SLP Ongoing aggressive stroke risk factor management Patient counseled to be compliant with her antithrombotic medications Patient counseled on Lifestyle modifications including, Diet, Exercise, and Stress Follow up with [facility] in 6 weeks  cerebral amyloid angiopathy 3 mm saccular aneurysm at the left A1-A2 junction. Outpatient Neurology follow up and surveillance   Hx of SEIZURES: No seizure activity reported overnight Continue Keppra at home dose Maintain Seizure precautions  HYPERTENSION: Stable, some elevated  B/P noted overnight. Avoid  Dehydration and Hypotension Permissive hypertension (OK if <220/120) for 24-48 hours post stroke and then gradually normalized within 5-7 days. Long term BP goal normotensive. May slowly restart home B/P medications after 48 hours Home Meds: Toprol-XL, patient is non-compliant with all of her home mediations  HYPERLIPIDEMIA:    Component Value Date/Time   CHOL 158 01/07/2018 2251   TRIG 96 01/07/2018 2251   HDL 56 01/07/2018 2251   CHOLHDL 2.8 01/07/2018 2251   VLDL 19 01/07/2018 2251   LDLCALC 83 01/07/2018 2251  Home Meds:  NONE LDL  goal < 70 Started on  Lipitor to 40 mg daily Continue statin at discharge  PRE- DIABETES: Lab Results  Component Value Date   HGBA1C 5.8 (H) 01/07/2018  HgbA1c goal < 7.0 Continue CBG monitoring and SSI to maintain glucose 140-180 mg/dl DM education   TOBACCO ABUSE Current smoker Smoking cessation counseling provided Nicotine patch provided  Other Active Problems: Principal Problem:   CVA (cerebral vascular accident) ([place]) Active Problems:   Hypothyroidism   Dyslipidemia   Essential hypertension   Carotid artery disease (HCC)   COPD (chronic obstructive pulmonary disease) with emphysema ([place])   Coronary artery disease involving native coronary artery of native heart with angina pectoris ([place])   Right arm numbness    Hospital day # 1 VTE prophylaxis: Lovenox  Diet : Fall precautions Diet Heart Room service appropriate? Yes; Fluid consistency: Thin   FAMILY UPDATES: family at bedside  TEAM UPDATES: Domenic Polite, MD   Prior Home Stroke Medications:  aspirin 81 mg daily  Discharge Stroke Meds:  Please discharge patient on aspirin 81 mg daily   Disposition: 01-Home or Self Care Therapy Recs:               PENDING Follow Up:  Follow-up Information    Garvin Fila, MD. Schedule an appointment as soon as possible for a visit in 6 week(s).   Specialties:  Neurology, Radiology Contact  information: [address] [place] [address] [phone]          Marletta Lor, MD -PCP Follow up in 1-2 weeks      Assessment & plan discussed with with attending physician and they are in agreement.    Mary Sella, ANP-C Stroke Neurology Team 01/09/2018 1:06 PM I have personally examined this patient, reviewed notes, independently viewed imaging studies, participated in medical decision making and plan of care.ROS completed by me personally and pertinent positives fully documented  I have made any additions or clarifications directly to the above note. Agree with note above. She has presented with multiple tiny punctate bilateral lacunar infarcts likely from small vessel disease. She does have some intracranial atherosclerosis as well but she has multiple uncontrolled risk factors and has been highly noncompliant with medications. Her MRI scan shows multiple microhemorrhages which increases her risk for intracerebral hemorrhage hence we'll stay away from on antiplatelet therapy and recommend aspirin 81 mg alone. Discussed with Dr. Broadus John. Greater than 50% time during this 25 minute visit was spent on counseling and coordination of care about her lacunar infarcts and answering questions.  Antony Contras, MD Medical Director [facility] Pager: [phone] 01/09/2018 2:51 PM  Neurology to sign-off at this time. Please call with any further questions or concerns. Thank you for this consultation. To contact Stroke Continuity provider, please refer to [email or web address] After hours, contact General Neurology

## 2018-01-09 NOTE — Care Management Note (Signed)
Case Management Note  Patient Details  Name: [name] MRN: [id number] Date of Birth: 08/08/1941  Subjective/Objective:                    Action/Plan: Pt asking for [facility] services since she wont be able to drive and her husband doesn't drive. Orders for [facility] placed. CM provided her choice and she selected [place]. Butch Penny with [facility] notified and accepted the referral.  Pt states she has 24/7 supervision at home with her husband.  Pt states her son will provide transportation home when medically stable.   Expected Discharge Date:                  Expected Discharge Plan:  [place]  In-House Referral:     Discharge planning Services  CM Consult  Post Acute Care Choice:  Home Health Choice offered to:  Patient  DME Arranged:    DME Agency:     HH Arranged:  PT [place]:  [facility]  Status of Service:  In process, will continue to follow  If discussed at Long Length of Stay Meetings, dates discussed:    Additional Comments:  Pollie Friar, RN 01/09/2018, 3:35 PM

## 2018-01-09 NOTE — Progress Notes (Signed)
Physical Therapy Treatment Patient Details Name: [name] MRN: [id number] DOB: [birth year] Today's Date: 01/09/2018    History of Present Illness [name] is a 77 y.o. female with medical history significant for CAD, carotid artery disease, chronic back pain, hypertension, hyperlipidemia, hypothyroidism, takotsubo cardiomyopathy presents to the emergency department with the chief complaint of difficulty speaking accompanied with a right hand numbness and weakness. Initial evaluation included an MRI brain revealing acute infarct    PT Comments    Patient with difficulty sequencing with cane despite practice and cues.  Possibly can learn with more PT, but safest device for now RW due to imbalance and decreased safety and deficit awareness.  Will benefit from follow up [place].    Follow Up Recommendations  Home health PT;Supervision/Assistance - 24 hour     Equipment Recommendations  Rolling walker with 5" wheels    Recommendations for Other Services       Precautions / Restrictions Precautions Precautions: Fall Restrictions Weight Bearing Restrictions: No    Mobility  Bed Mobility Overal bed mobility: Modified Independent             General bed mobility comments: used bed rail but no physical assist getting in/out of bed  Transfers Overall transfer level: Needs assistance Equipment used: Rolling walker (2 wheeled) Transfers: Sit to/from Stand Sit to Stand: Supervision         General transfer comment: assist for safety  Ambulation/Gait Ambulation/Gait assistance: Supervision;Min guard Ambulation Distance (Feet): 150 Feet(& 200') Assistive device: Rolling walker (2 wheeled);Straight cane Gait Pattern/deviations: Step-through pattern;Decreased stride length     General Gait Details: ambulated initially with RW due to OT reports needs device for home.  Noted no LOB and safe technique,  with cane needed minguard at least due to narrow BOS and problem  sequencing with cane despite cues and assist initially.   Stairs Stairs: Yes   Stair Management: One rail Right Number of Stairs: 3    Wheelchair Mobility    Modified Rankin (Stroke Patients Only) Modified Rankin (Stroke Patients Only) Pre-Morbid Rankin Score: No symptoms Modified Rankin: Moderately severe disability     Balance Overall balance assessment: Needs assistance Sitting-balance support: Feet supported;No upper extremity supported Sitting balance-Leahy Scale: Good Sitting balance - Comments: pt able to reach down and pull up socks   Standing balance support: No upper extremity supported Standing balance-Leahy Scale: Fair Standing balance comment: static standing no UE support, but dynamic balance with LOB/instability                            Cognition Arousal/Alertness: Awake/alert Behavior During Therapy: Flat affect Overall Cognitive Status: No family/caregiver present to determine baseline cognitive functioning Area of Impairment: Attention;Memory;Following commands;Problem solving;Safety/judgement;Awareness                   Current Attention Level: Selective Memory: Decreased short-term memory Following Commands: Follows one step commands consistently Safety/Judgement: Decreased awareness of safety Awareness: Emergent Problem Solving: Slow processing General Comments: Will further assess; increased time for processing      Exercises      General Comments        Pertinent Vitals/Pain Pain Assessment: No/denies pain    Home Living Family/patient expects to be discharged to:: Private residence Living Arrangements: Spouse/significant other;Children Available Help at Discharge: Family;Available 24 hours/day(son works but states he'll make arrangements) Type of Home: House Home Access: Stairs to enter Entrance Stairs-Rails: Right Home Layout: One  level Home Equipment: None      Prior Function Level of Independence:  Independent      Comments: drives, cooks, cleans, spouse needs some assist (states she has "balance issues")   PT Goals (current goals can now be found in the care plan section) Acute Rehab PT Goals Patient Stated Goal: home Progress towards PT goals: Progressing toward goals    Frequency    Min 4X/week      PT Plan Discharge plan needs to be updated;Equipment recommendations need to be updated    Co-evaluation              AM-PAC PT "6 Clicks" Daily Activity  Outcome Measure  Difficulty turning over in bed (including adjusting bedclothes, sheets and blankets)?: None Difficulty moving from lying on back to sitting on the side of the bed? : None Difficulty sitting down on and standing up from a chair with arms (e.g., wheelchair, bedside commode, etc,.)?: A Little Help needed moving to and from a bed to chair (including a wheelchair)?: A Little Help needed walking in hospital room?: A Little Help needed climbing 3-5 steps with a railing? : A Little 6 Click Score: 20    End of Session Equipment Utilized During Treatment: Gait belt Activity Tolerance: Patient tolerated treatment well Patient left: in bed;with call bell/phone within reach;with family/visitor present   PT Visit Diagnosis: Unsteadiness on feet (R26.81)     Time: 0347-4259 PT Time Calculation (min) (ACUTE ONLY): 16 min  Charges:  $Gait Training: 8-22 mins                    G CodesMagda Kiel, [place] 01/09/2018    Reginia Naas 01/09/2018, 4:40 PM

## 2018-01-09 NOTE — Progress Notes (Signed)
CM met with the patient concerning outpatient therapy. Pt states she will not have transportation to get to the therapy. She would rather have [place] services at this time. MD updated.

## 2018-01-09 NOTE — Progress Notes (Addendum)
PROGRESS NOTE    [name]  MLY:[id number] DOB: 02/17/1941 DOA: 01/07/2018 PCP: Marletta Lor, MD  Brief Narrative: [name] is a 77 y.o. female with a Past Medical History of takotsubo cardiomyopathy; Raynaud's syndrome; Cerebral amyloid angiopathy, hypothyroidism; HLD; HTN; CAD; and carotid stenosis who presents with confusion, expressive aphasia.  MRI concerning for multiple acute infarcts.  Assessment & Plan:   Multiple acute CVA -possibly embolic in origin -pt presented with expressive aphasia worsening of chronic intermittent right hand/arm numbness tingling. - MRI noted Scattered punctate subcentimeter acute ischemic infarcts involving the parasagittal left parietal lobe, inferior left frontal lobe, and anterior right temporal lobe -she is on ASA 81mg  daily -per Dr.Xu she has a cerebral amyloid angiopathy and hence is at high risk of bleeding with higher dose antiplatelets or anticoagulants -Neurology consulting -Echocardiogram with preserved EF and carotid duplex with 1-39% stenosis -LDL is 83-continue statin, HbA1c is 5.8 -PT OT/SLP evaluation today  Hypertension. -Permissible hypertension -Metoprolol on hold, heart rate in the 50s to 60s range  CAD status post stent. - Home medications include aspirin 81 mg. Chart review indicates she was also on Plavix in the past but this was discontinued. No chest pain. EKG as noted above. -continue aspirin, added statin  Right hand/arm numbness. These symptoms have been going on for a while. Chart review indicates she was evaluated and by neurology in October 2018. The MRI consistent with cerebral amyloid angiopathy. She has high risk of cortical ICH. Her episodes are CCA related transient focal neurological episodes (TFNEs). EEG no seizure activity, added keppra -Continue Keppra  COPD. Is not on home oxygen. Oxygen saturation level greater than 90% on room air. Home medications does include albuterol inhaler. Chest  x-ray pending at time of admission -stable, Continue albuterol as needed  Tobacco use. -Cessation counseling   Hypothyroidism. Home medications include Synthroid. -TSH ok   DVT prophylaxis: lovenox  Code Status: full  Family Communication: none present  Disposition Plan: home pending completion of workup   Consultants:   Neuro   Procedures:   Antimicrobials:    Subjective: -feels better, speech improving  Objective: Vitals:   01/08/18 2000 01/09/18 0109 01/09/18 0535 01/09/18 0955  BP: (!) 176/86 (!) 161/81 (!) 160/95 (!) 149/90  Pulse: 64 61 67 62  Resp: 18 18 18 17   Temp: 98.8 F (37.1 C) 98.3 F (36.8 C) 98.4 F (36.9 C) 98.2 F (36.8 C)  TempSrc: Oral Oral Oral Oral  SpO2: 94% 96% 94% 96%  Weight: 46.5 kg (102 lb 8.2 oz)     Height: 5\' 5"  (1.651 m)       Intake/Output Summary (Last 24 hours) at 01/09/2018 1232 Last data filed at 01/09/2018 0405 Gross per 24 hour  Intake 882.5 ml  Output -  Net 882.5 ml   Filed Weights   01/07/18 2222 01/08/18 2000  Weight: 46.7 kg (103 lb) 46.5 kg (102 lb 8.2 oz)    Examination:  General exam: Appears calm and comfortable  Respiratory system: Clear to auscultation. Respiratory effort normal. Cardiovascular system: S1 & S2 heard, RRR. No JVD, murmurs, rubs, gallops  Gastrointestinal system: Abdomen is nondistended, soft and nontender.Normal bowel sounds heard. Central nervous system: Alert and oriented. Mild dysarthria, motor 5/5, sensations intact, DTR 2+, plantars withdrawal Extremities: no edema Skin: No rashes, lesions or ulcers Psychiatry: Judgement and insight appear normal. Mood & affect appropriate.     Data Reviewed:   CBC: Recent Labs  Lab 01/07/18 2247 01/07/18 2251  WBC  --  5.4  NEUTROABS  --  2.4  HGB 13.9 13.7  HCT 41.0 43.0  MCV  --  98.9  PLT  --  431   Basic Metabolic Panel: Recent Labs  Lab 01/07/18 2247 01/07/18 2251  NA 142 141  K 3.7 3.9  CL 101 103  CO2  --  29    GLUCOSE 102* 109*  BUN 14 13  CREATININE 1.00 0.99  CALCIUM  --  9.2   GFR: Estimated Creatinine Clearance: 36 mL/min (by C-G formula based on SCr of 0.99 mg/dL). Liver Function Tests: Recent Labs  Lab 01/07/18 2251  AST 19  ALT 13*  ALKPHOS 75  BILITOT 0.6  PROT 5.6*  ALBUMIN 3.6   No results for input(s): LIPASE, AMYLASE in the last 168 hours. No results for input(s): AMMONIA in the last 168 hours. Coagulation Profile: Recent Labs  Lab 01/07/18 2251  INR 1.02   Cardiac Enzymes: No results for input(s): CKTOTAL, CKMB, CKMBINDEX, TROPONINI in the last 168 hours. BNP (last 3 results) No results for input(s): PROBNP in the last 8760 hours. HbA1C: Recent Labs    01/07/18 2251  HGBA1C 5.8*   CBG: No results for input(s): GLUCAP in the last 168 hours. Lipid Profile: Recent Labs    01/07/18 2251  CHOL 158  HDL 56  LDLCALC 83  TRIG 96  CHOLHDL 2.8   Thyroid Function Tests: Recent Labs    01/07/18 2251  TSH 3.557   Anemia Panel: No results for input(s): VITAMINB12, FOLATE, FERRITIN, TIBC, IRON, RETICCTPCT in the last 72 hours. Urine analysis:    Component Value Date/Time   COLORURINE STRAW (A) 01/08/2018 1135   APPEARANCEUR CLEAR 01/08/2018 1135   LABSPEC 1.008 01/08/2018 1135   PHURINE 7.0 01/08/2018 1135   GLUCOSEU NEGATIVE 01/08/2018 1135   HGBUR NEGATIVE 01/08/2018 1135   HGBUR trace-intact 11/10/2007 1531   BILIRUBINUR NEGATIVE 01/08/2018 1135   KETONESUR NEGATIVE 01/08/2018 1135   PROTEINUR NEGATIVE 01/08/2018 1135   UROBILINOGEN negative 11/10/2007 1531   NITRITE NEGATIVE 01/08/2018 1135   LEUKOCYTESUR NEGATIVE 01/08/2018 1135   Sepsis Labs: @LABRCNTIP (procalcitonin:4,lacticidven:4)  )No results found for this or any previous visit (from the past 240 hour(s)).       Radiology Studies: Ct Angio Head W Or Wo Contrast  Result Date: 01/08/2018 CLINICAL DATA:  CVA. Scattered punctate acute subcortical infarcts. Left PCA occlusion.  Anterior communicating artery aneurysm. EXAM: CT ANGIOGRAPHY HEAD AND NECK TECHNIQUE: Multidetector CT imaging of the head and neck was performed using the standard protocol during bolus administration of intravenous contrast. Multiplanar CT image reconstructions and MIPs were obtained to evaluate the vascular anatomy. Carotid stenosis measurements (when applicable) are obtained utilizing NASCET criteria, using the distal internal carotid diameter as the denominator. CONTRAST:  70mL ISOVUE-370 IOPAMIDOL (ISOVUE-370) INJECTION 76% COMPARISON:  MRI brain and MRA head 01/08/2018. CT chest 04/25/2015. FINDINGS: CTA NECK FINDINGS Aortic arch: A 3 vessel arch configuration is present. Atherosclerotic calcifications are present at the aortic arch without aneurysm or significant stenosis at the great vessel origins. Right carotid system: Atherosclerotic calcifications are present along the course of the right common carotid artery. Dense calcifications are present at the right carotid bifurcation without a significant stenosis relative to the more distal vessel. Cervical right ICA mildly tortuous without significant stenosis. Left carotid system: The left common carotid artery also demonstrates extensive atherosclerotic calcification without a significant stenosis. Atherosclerotic calcification is present at the left carotid bifurcation. There is mild narrowing of the  left external carotid artery. There is no significant stenosis of the left internal carotid artery relative to the more distal vessel. Mild tortuosity is present. Vertebral arteries: The vertebral arteries are codominant. Both vertebral arteries originate from the subclavian arteries without significant stenosis. Atherosclerotic calcifications are present in the proximal left vertebral artery. There is no focal stenosis of either vertebral artery within the neck. Skeleton: Grade 1 anterolisthesis present at C4-5. Chronic loss of disc height and endplate  changes present C5-6 and C6-7 with osseous foraminal narrowing greatest at C5-6, right greater than left. No focal lytic or blastic lesions are present. Vertebral body heights are maintained. Other neck: No focal mucosal or submucosal lesions are present. Epiglottis is within normal limits. The hypopharynx is clear. Vocal cords are midline and symmetric. Trachea is unremarkable. Upper chest: Centrilobular emphysematous changes are present. A 5 mm nodule in the left upper lobe is stable since 2016. No new nodules are present. Review of the MIP images confirms the above findings CTA HEAD FINDINGS Anterior circulation: The internal carotid arteries demonstrate atherosclerotic changes within the cavernous segments. There is no significant stenosis relative to the more distal vessels. The ICA termini are within limits bilaterally. The A1 and M1 segments are normal. There is mild narrowing of the proximal left M1 segment the more distal left M1 segment is normal. A 2 mm posteriorly and superiorly directed aneurysm is present at the left A1-A2 junction. No other focal aneurysm is present. MCA bifurcations are intact bilaterally. ACA and MCA branch vessels are within normal limits. Posterior circulation: The vertebral arteries are codominant. Atherosclerotic calcifications are present along both vertebral arteries. The right PICA origin is visualized and normal. The left PICA originates below the dural margin. The basilar artery is mildly irregular without a significant stenosis. The left posterior cerebral artery is occluded proximally. The right PCA branch vessels are normal. There is marked attenuation of the left posterior cerebral artery. Venous sinuses: The dural sinuses are patent. The straight sinus deep cerebral veins are intact. Cortical veins are unremarkable. Anatomic variants: None. Delayed phase: The postcontrast images accentuate white matter disease. No pathologic enhancement is present. Review of the MIP  images confirms the above findings IMPRESSION: 1. Stable appearance of left PCA occlusion proximally. Collateral vessels are noted. 2. Atherosclerotic changes at the carotid bifurcations bilaterally without significant stenosis. 3. Additional atherosclerotic calcifications present at the aortic arch and cavernous internal carotid artery without a significant stenosis. 4. Mild narrowing of the proximal left M1 segment rather than aneurysmal dilation of the more distal left M1 segment. 5. 2 mm posteriorly and superiorly directed aneurysm from the left A1-A2 junction. Electronically Signed   By: San Morelle M.D.   On: 01/08/2018 12:15   Dg Chest 2 View  Result Date: 01/08/2018 CLINICAL DATA:  Aphasic with right upper extremity numbness EXAM: CHEST  2 VIEW COMPARISON:  May 15, 2017 FINDINGS: Lungs are mildly hyperexpanded. There is no edema or consolidation. Heart size and pulmonary vascularity are normal. No adenopathy. There is an evident coronary artery stent on the left. There is aortic atherosclerosis. No bone lesions evident. IMPRESSION: Aortic atherosclerosis. Lungs mildly hyperexpanded without edema or consolidation. Aortic Atherosclerosis (ICD10-I70.0). Electronically Signed   By: Lowella Grip III M.D.   On: 01/08/2018 08:02   Ct Head Wo Contrast  Result Date: 01/07/2018 CLINICAL DATA:  Intermittent right arm to mouth numbness with effusion EXAM: CT HEAD WITHOUT CONTRAST TECHNIQUE: Contiguous axial images were obtained from the base of the skull through  the vertex without intravenous contrast. COMPARISON:  MRI brain 08/12/2017, CT brain 02/29/2016 FINDINGS: Brain: No acute territorial infarction, hemorrhage or intracranial mass is visualized. Mild atrophy. Extensive hypodensity throughout the periventricular and subcortical white matter. Stable ventricle size. Vascular: No hyperdense vessels. Carotid vessel calcification. Vertebral artery calcification. Skull: Normal. Negative for fracture  or focal lesion. Sinuses/Orbits: No acute finding. Other: None IMPRESSION: 1. No definite CT evidence for acute intracranial abnormality. 2. Extensive hypodensity throughout the bilateral white matter, most likely small vessel ischemic change. Electronically Signed   By: Donavan Foil M.D.   On: 01/07/2018 23:16   Ct Angio Neck W Or Wo Contrast  Result Date: 01/08/2018 CLINICAL DATA:  CVA. Scattered punctate acute subcortical infarcts. Left PCA occlusion. Anterior communicating artery aneurysm. EXAM: CT ANGIOGRAPHY HEAD AND NECK TECHNIQUE: Multidetector CT imaging of the head and neck was performed using the standard protocol during bolus administration of intravenous contrast. Multiplanar CT image reconstructions and MIPs were obtained to evaluate the vascular anatomy. Carotid stenosis measurements (when applicable) are obtained utilizing NASCET criteria, using the distal internal carotid diameter as the denominator. CONTRAST:  27mL ISOVUE-370 IOPAMIDOL (ISOVUE-370) INJECTION 76% COMPARISON:  MRI brain and MRA head 01/08/2018. CT chest 04/25/2015. FINDINGS: CTA NECK FINDINGS Aortic arch: A 3 vessel arch configuration is present. Atherosclerotic calcifications are present at the aortic arch without aneurysm or significant stenosis at the great vessel origins. Right carotid system: Atherosclerotic calcifications are present along the course of the right common carotid artery. Dense calcifications are present at the right carotid bifurcation without a significant stenosis relative to the more distal vessel. Cervical right ICA mildly tortuous without significant stenosis. Left carotid system: The left common carotid artery also demonstrates extensive atherosclerotic calcification without a significant stenosis. Atherosclerotic calcification is present at the left carotid bifurcation. There is mild narrowing of the left external carotid artery. There is no significant stenosis of the left internal carotid artery  relative to the more distal vessel. Mild tortuosity is present. Vertebral arteries: The vertebral arteries are codominant. Both vertebral arteries originate from the subclavian arteries without significant stenosis. Atherosclerotic calcifications are present in the proximal left vertebral artery. There is no focal stenosis of either vertebral artery within the neck. Skeleton: Grade 1 anterolisthesis present at C4-5. Chronic loss of disc height and endplate changes present C5-6 and C6-7 with osseous foraminal narrowing greatest at C5-6, right greater than left. No focal lytic or blastic lesions are present. Vertebral body heights are maintained. Other neck: No focal mucosal or submucosal lesions are present. Epiglottis is within normal limits. The hypopharynx is clear. Vocal cords are midline and symmetric. Trachea is unremarkable. Upper chest: Centrilobular emphysematous changes are present. A 5 mm nodule in the left upper lobe is stable since 2016. No new nodules are present. Review of the MIP images confirms the above findings CTA HEAD FINDINGS Anterior circulation: The internal carotid arteries demonstrate atherosclerotic changes within the cavernous segments. There is no significant stenosis relative to the more distal vessels. The ICA termini are within limits bilaterally. The A1 and M1 segments are normal. There is mild narrowing of the proximal left M1 segment the more distal left M1 segment is normal. A 2 mm posteriorly and superiorly directed aneurysm is present at the left A1-A2 junction. No other focal aneurysm is present. MCA bifurcations are intact bilaterally. ACA and MCA branch vessels are within normal limits. Posterior circulation: The vertebral arteries are codominant. Atherosclerotic calcifications are present along both vertebral arteries. The right PICA origin is  visualized and normal. The left PICA originates below the dural margin. The basilar artery is mildly irregular without a significant  stenosis. The left posterior cerebral artery is occluded proximally. The right PCA branch vessels are normal. There is marked attenuation of the left posterior cerebral artery. Venous sinuses: The dural sinuses are patent. The straight sinus deep cerebral veins are intact. Cortical veins are unremarkable. Anatomic variants: None. Delayed phase: The postcontrast images accentuate white matter disease. No pathologic enhancement is present. Review of the MIP images confirms the above findings IMPRESSION: 1. Stable appearance of left PCA occlusion proximally. Collateral vessels are noted. 2. Atherosclerotic changes at the carotid bifurcations bilaterally without significant stenosis. 3. Additional atherosclerotic calcifications present at the aortic arch and cavernous internal carotid artery without a significant stenosis. 4. Mild narrowing of the proximal left M1 segment rather than aneurysmal dilation of the more distal left M1 segment. 5. 2 mm posteriorly and superiorly directed aneurysm from the left A1-A2 junction. Electronically Signed   By: San Morelle M.D.   On: 01/08/2018 12:15   Mr Brain Wo Contrast (neuro Protocol)  Result Date: 01/08/2018 CLINICAL DATA:  Initial evaluation for intermittent right-sided numbness with aphasia. EXAM: MRI HEAD WITHOUT CONTRAST TECHNIQUE: Multiplanar, multiecho pulse sequences of the brain and surrounding structures were obtained without intravenous contrast. COMPARISON:  Prior CT from 01/07/2018 as well as previous MRI from 08/12/2017. FINDINGS: Brain: Stable atrophy. Extensive T2/FLAIR hyperintensity involving the periventricular, deep, and subcortical white matter both cerebral hemispheres, most consistent with advanced chronic microvascular ischemic disease. Punctate 5 mm focus of diffusion abnormality within the parasagittal left parietal lobe, consistent with a small acute ischemic infarct (series 3, image 34). Additional punctate cortical infarct within the  anterior inferior left frontal lobe (series 3, image 28). Additional 5 mm acute ischemic infarct within the subcortical white matter of the anterior right temporal lobe (series 3, image 16). Additional patchy diffusion abnormality within the subcortical white matter of the right parietal lobe suspicious for acute to subacute small vessel type ischemia (series 3, image 34, 32). No definite associated hemorrhage about these infarcts. No associated mass effect. Extensive chronic micro hemorrhages again seen throughout the brain, most notable within the left parietal region, suggesting cerebral amyloid angiopathy. No definite acute intracranial hemorrhage. No mass lesion, midline shift or mass effect. No hydrocephalus. No extra-axial fluid collection. Major dural sinuses are grossly patent. Pituitary gland suprasellar region normal. Midline structures intact and normal. Vascular: Major intracranial vascular flow voids are maintained. Skull and upper cervical spine: Craniocervical junction within normal limits. Upper cervical spine normal. Bone marrow signal intensity within normal limits. No scalp soft tissue abnormality. Sinuses/Orbits: Globes and orbital soft tissues within normal limits. Paranasal sinuses are clear. No mastoid effusion. Inner ear structures normal. Other: None. IMPRESSION: 1. Scattered punctate subcentimeter acute ischemic infarcts involving the parasagittal left parietal lobe, inferior left frontal lobe, and anterior right temporal lobe as above. No associated hemorrhage or mass effect. 2. Additional small volume mild patchy diffusion abnormality within the subcortical white matter of the right parietal lobe, suspicious for acute to early subacute small vessel type ischemic changes. 3. Extensive chronic micro hemorrhages throughout the brain, greatest within the left parietal region. Findings suggestive of cerebral amyloid angiopathy. 4. Extensive chronic microvascular ischemic changes.  Electronically Signed   By: Jeannine Boga M.D.   On: 01/08/2018 05:51   Mr Jodene Nam Head Wo Contrast  Result Date: 01/08/2018 CLINICAL DATA:  Right hand numbness and weakness. Difficulty speaking. Scattered punctate infarcts  in both cerebral hemispheres on MRI EXAM: MRA HEAD WITHOUT CONTRAST TECHNIQUE: Angiographic images of the Circle of Willis were obtained using MRA technique without intravenous contrast. COMPARISON:  None. FINDINGS: The visualized distal vertebral arteries are patent to the basilar and codominant. Patent PICA and SCA origins are identified bilaterally. The basilar artery is patent with an irregular and mildly ectatic appearance proximally. No significant basilar artery stenosis is identified. Posterior communicating arteries are not identified and may be diminutive or absent. The right PCA is patent without significant proximal stenosis. The left PCA occludes near the P1-P2 junction with some reconstitution of thready/diseased appearing distal branches. The internal carotid arteries are widely patent from skull base to carotid termini. The ACAs and MCAs are patent without evidence of proximal branch occlusion or significant proximal stenosis. There is mild fusiform dilatation of the mid left M1 segment to 3.5 mm diameter, and there is also mild fusiform dilatation of the right MCA at the bifurcation to 4 mm diameter. Mild motion artifact is present in both of these regions which may accentuate the apparent dilatation. There is a 3 x 2 mm aneurysm projecting posteriorly, medially, and superiorly from the left A1-A2 junction. IMPRESSION: 1. Left PCA occlusion near the P1-P2 junction. 2. 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 Head CTA is recommended for further evaluation of these findings. Electronically Signed   By: Logan Bores M.D.    On: 01/08/2018 08:41   Vas US Carotid (at [facility] Only)  Result Date: 01/08/2018 Carotid Arterial Duplex Study Indications:  CVA. Risk Factors: Hypertension, hyperlipidemia, coronary artery disease. Examination Guidelines: A complete evaluation includes B-mode imaging, spectral doppler, color doppler, and power doppler as needed of all accessible portions of each vessel. Bilateral testing is considered an integral part of a complete examination. Limited examinations for reoccurring indications may be performed as noted.  Right Carotid Findings: +----------+-------+-------+--------+---------------------------------+--------+           PSV    EDV    StenosisDescribe                         Comments           cm/s   cm/s                                                     +----------+-------+-------+--------+---------------------------------+--------+ CCA Prox  58     15             smooth and heterogenous                   +----------+-------+-------+--------+---------------------------------+--------+ CCA Distal-50    -14            smooth, heterogenous and calcific         +----------+-------+-------+--------+---------------------------------+--------+ ICA Prox  -74    -28            calcific, irregular and  heterogenous                              +----------+-------+-------+--------+---------------------------------+--------+ ICA Distal-60    -20                                             tortuous +----------+-------+-------+--------+---------------------------------+--------+ ECA       -129   -32            heterogenous and calcific                 +----------+-------+-------+--------+---------------------------------+--------+ +----------+--------+-------+----------------+-------------------+           PSV cm/sEDV cmsDescribe        Arm Pressure (mmHG)  +----------+--------+-------+----------------+-------------------+ [id number]             Multiphasic, WNL                    +----------+--------+-------+----------------+-------------------+ +---------+--------+--+--------+--+---------+ VertebralPSV cm/s47EDV cm/s14Antegrade +---------+--------+--+--------+--+---------+  Left Carotid Findings: +----------+-------+-------+--------+---------------------------------+--------+           PSV    EDV    StenosisDescribe                         Comments           cm/s   cm/s                                                     +----------+-------+-------+--------+---------------------------------+--------+ CCA Prox  57     18             heterogenous and smooth                   +----------+-------+-------+--------+---------------------------------+--------+ CCA Distal-63    -15                                                      +----------+-------+-------+--------+---------------------------------+--------+ ICA Prox  57     18             calcific, irregular and                                                   heterogenous                              +----------+-------+-------+--------+---------------------------------+--------+ ICA Distal-109   -36                                                      +----------+-------+-------+--------+---------------------------------+--------+ ECA       -181   -29            smooth, heterogenous and calcific         +----------+-------+-------+--------+---------------------------------+--------+ +----------+--------+--------+----------------+-------------------+  SubclavianPSV cm/sEDV cm/sDescribe        Arm Pressure (mmHG) +----------+--------+--------+----------------+-------------------+           105             Multiphasic, WNL                    +----------+--------+--------+----------------+-------------------+  +---------+--------+--+--------+--+---------+ VertebralPSV cm/s55EDV cm/s15Antegrade +---------+--------+--+--------+--+---------+  Final Interpretation: Right Carotid: Velocities in the right ICA are consistent with a 1-39% stenosis.                Non-hemodynamically significant plaque <50% noted in the CCA. Left Carotid: Velocities in the left ICA are consistent with a 1-39% stenosis.               Non-hemodynamically significant plaque noted in the CCA. Vertebrals: Both vertebral arteries were patent with antegrade flow. *See table(s) above for measurements and observations.  Electronically signed by Antony Contras on 01/08/2018 at 12:48:55 PM.        Scheduled Meds: . aspirin EC  81 mg Oral Daily  . atorvastatin  40 mg Oral q1800  . enoxaparin (LOVENOX) injection  40 mg Subcutaneous Daily  . levETIRAcetam  500 mg Oral BID  . levothyroxine  75 mcg Oral QAC breakfast   Continuous Infusions:   LOS: 1 day    Time spent: 67min    Domenic Polite, MD [facility] Page via [email or web address], password [email or web address] After 7PM please contact night-coverage  01/09/2018, 12:32 PM

## 2018-01-10 MED ORDER — ATORVASTATIN CALCIUM 40 MG PO TABS: 40.0000 mg | ORAL_TABLET | Freq: Every day | ORAL | 0 refills | Status: DC

## 2018-01-10 NOTE — Progress Notes (Signed)
Pt d/c home no new concerns, pt is stable alert and oriented. D/C instructions done with teach back Pt verbalize understanding. Pt will be transported out of facility pt family.

## 2018-01-10 NOTE — Consult Note (Signed)
[facility] CM Primary Care Navigator  01/10/2018  [name] July 17, 1941 [id number]   Met with patientand son Nicole Kindred) at the bedside to identify possible discharge needs. Son reports that patient hadincoherent speech and right upper extremity numbness going up to her face thathad led to this admission.  Patient endorses Dr. Bluford Kaufmann with [facility] [place] as herprimary care provider.   [facility] [address].   Patient states managing herownmedications at home using "pill box" system filledevery week.  Son providestransportation to herdoctors'appointments .  Patient lives at home with husband and son. Her son states that patient's husband can assist with her care needs at homewhile son is at work after discharge.   Anticipated discharge plan is homewithhome health services per therapy recommendation.  Patientand son voiced understanding to call primary care provider's office for a post discharge follow-up appointment within1-2 weeksor sooner if needs arise.Patient letter (with PCP's contact number) was provided as their reminder.  Explained to patient and son regarding[facility] CM services available for health management at home butbothdenied having anypressingneeds or concerns at thispoint.  Patient and son voiced understandingto seek referral from primary care provider to [facility] care management if necessary and appropriate for services in thefuture.  [facility] care management information provided for future needs that may arise.  Patient however, verbally agreed and opted for EMMIstrokecalls to follow-upwithrecovery at home.  Referral made forEMMIStrokecalls after discharge.   For questions, please contact:  Dannielle Huh, BSN, RN- [facility] Primary Care Navigator  Telephone: [phone] [place]

## 2018-01-10 NOTE — Progress Notes (Signed)
Physical Therapy Treatment Patient Details Name: [name] MRN: [id number] DOB: [birth year] Today's Date: 01/10/2018    History of Present Illness Pt is a 77 y.o. female with PMH significant for CAD, chronic back pain, HTN, HLD, hypothyroidism, takotsubo cardiomyopathy, who presents to the ED on 01/07/18 with the chief complaint of difficulty speaking accompanied with a right hand numbness and weakness. MRI showed scattered acute infarcts involving left parietal, left frontal, and right termporal lobe; extensive chronic micro hemorrhages throughout brain.   PT Comments    Pt progressing well with mobility. Able to amb 250' with RW and supervision; additional in-room ambulation with no DME and supervision for balance. Pt with no questions or concerns regarding discharge home. Will have 24/7 support at home. From a mobility perspective, feel pt is safe to return home with supervision and HHPT services. Will discharge acute PT.   Follow Up Recommendations  Home health PT;Supervision/Assistance - 24 hour     Equipment Recommendations  Rolling walker with 5" wheels    Recommendations for Other Services       Precautions / Restrictions Precautions Precautions: Fall Restrictions Weight Bearing Restrictions: No    Mobility  Bed Mobility Overal bed mobility: Independent                Transfers Overall transfer level: Modified independent Equipment used: Rolling walker (2 wheeled) Transfers: Sit to/from Stand              Ambulation/Gait Ambulation/Gait assistance: Supervision Ambulation Distance (Feet): 250 Feet Assistive device: Rolling walker (2 wheeled);None Gait Pattern/deviations: Step-through pattern;Decreased stride length Gait velocity: Decreased Gait velocity interpretation: Below normal speed for age/gender General Gait Details: Amb throughout hallway with RW and supervision for safety. Additionally amb in room with no DME and supervision for  balance   Stairs Stairs: Yes   Stair Management: One rail Right Number of Stairs: 5 General stair comments: Supervision for safety  Wheelchair Mobility    Modified Rankin (Stroke Patients Only)       Balance Overall balance assessment: Needs assistance Sitting-balance support: Feet supported;No upper extremity supported Sitting balance-Leahy Scale: Good     Standing balance support: No upper extremity supported Standing balance-Leahy Scale: Fair                              Cognition Arousal/Alertness: Awake/alert Behavior During Therapy: Flat affect Overall Cognitive Status: Impaired/Different from baseline Area of Impairment: Attention;Problem solving                   Current Attention Level: Selective         Problem Solving: Slow processing General Comments: Apparent cognitive deficits improved this session. Pt still with flat affect and slowed processing      Exercises      General Comments General comments (skin integrity, edema, etc.): Son present during session; had no questions/concerns for returning home      Pertinent Vitals/Pain Pain Assessment: No/denies pain    Home Living                      Prior Function            PT Goals (current goals can now be found in the care plan section) Acute Rehab PT Goals Patient Stated Goal: home PT Goal Formulation: With patient Time For Goal Achievement: 01/22/18 Potential to Achieve Goals: Good Progress towards PT goals: Progressing toward goals  Frequency    Min 4X/week      PT Plan Current plan remains appropriate    Co-evaluation              AM-PAC PT "6 Clicks" Daily Activity  Outcome Measure  Difficulty turning over in bed (including adjusting bedclothes, sheets and blankets)?: None Difficulty moving from lying on back to sitting on the side of the bed? : None Difficulty sitting down on and standing up from a chair with arms (e.g.,  wheelchair, bedside commode, etc,.)?: None Help needed moving to and from a bed to chair (including a wheelchair)?: A Little Help needed walking in hospital room?: A Little Help needed climbing 3-5 steps with a railing? : A Little 6 Click Score: 21    End of Session Equipment Utilized During Treatment: Gait belt Activity Tolerance: Patient tolerated treatment well Patient left: in chair;with call bell/phone within reach;with family/visitor present Nurse Communication: Mobility status PT Visit Diagnosis: Unsteadiness on feet (R26.81)     Time: 4540-9811 PT Time Calculation (min) (ACUTE ONLY): 16 min  Charges:  $Gait Training: 8-22 mins                    G Codes:      Mabeline Caras, PT, DPT Acute Rehab Services  Pager: [place] 01/10/2018, 11:59 AM

## 2018-01-10 NOTE — Care Management Note (Signed)
Case Management Note  Patient Details  Name: [name] MRN: [id number] Date of Birth: August 23, 1941  Subjective/Objective:                    Action/Plan: Pt discharging home with [facility] services. CM met with her yesterday and set up [facility] services. Pt with orders today for walker and recommendations for 3 in 1. Pt refusing 3 in 1. Butch Penny with [facility] notified of walker and will have delivered to the room. Pt has transportation home .  Expected Discharge Date:  01/10/18               Expected Discharge Plan:  [place]  In-House Referral:     Discharge planning Services  CM Consult  Post Acute Care Choice:  Home Health Choice offered to:  Patient  DME Arranged:  Walker rolling DME Agency:  [place] Arranged:  PT, OT, Speech Therapy [place] Agency:  [place]  Status of Service:  Completed, signed off  If discussed at [place] of Stay Meetings, dates discussed:    Additional Comments:  Pollie Friar, RN 01/10/2018, 10:36 AM

## 2018-01-10 NOTE — Progress Notes (Signed)
SLP Cancellation Note  Patient Details Name: [name] MRN: [id number] DOB: 08-10-1941   Cancelled treatment:       Reason Eval/Treat Not Completed: DC home pending. SLP screened, Acute SLP evaluation deferred, as pt would benefit more from SLP evaluation through home health, given difficulty with higher level expressive language and recent cognitive difficulties (remembering to take medications) at home.   RECOMMEND: home health speech therapy referral  Celia B. Quentin Ore [facility], CCC-SLP Speech Language Pathologist [phone]  Shonna Chock 01/10/2018, 10:30 AM

## 2018-01-12 DIAGNOSIS — Z7982 Long term (current) use of aspirin: Secondary | ICD-10-CM | POA: Diagnosis not present

## 2018-01-12 DIAGNOSIS — K5790 Diverticulosis of intestine, part unspecified, without perforation or abscess without bleeding: Secondary | ICD-10-CM | POA: Diagnosis not present

## 2018-01-12 DIAGNOSIS — J449 Chronic obstructive pulmonary disease, unspecified: Secondary | ICD-10-CM | POA: Diagnosis not present

## 2018-01-12 DIAGNOSIS — I10 Essential (primary) hypertension: Secondary | ICD-10-CM | POA: Diagnosis not present

## 2018-01-12 DIAGNOSIS — Z955 Presence of coronary angioplasty implant and graft: Secondary | ICD-10-CM | POA: Diagnosis not present

## 2018-01-12 DIAGNOSIS — Z720 Tobacco use: Secondary | ICD-10-CM | POA: Diagnosis not present

## 2018-01-12 DIAGNOSIS — E785 Hyperlipidemia, unspecified: Secondary | ICD-10-CM | POA: Diagnosis not present

## 2018-01-12 DIAGNOSIS — I25119 Atherosclerotic heart disease of native coronary artery with unspecified angina pectoris: Secondary | ICD-10-CM | POA: Diagnosis not present

## 2018-01-12 DIAGNOSIS — D649 Anemia, unspecified: Secondary | ICD-10-CM | POA: Diagnosis not present

## 2018-01-12 DIAGNOSIS — E039 Hypothyroidism, unspecified: Secondary | ICD-10-CM | POA: Diagnosis not present

## 2018-01-12 DIAGNOSIS — M1990 Unspecified osteoarthritis, unspecified site: Secondary | ICD-10-CM | POA: Diagnosis not present

## 2018-01-12 DIAGNOSIS — Z8673 Personal history of transient ischemic attack (TIA), and cerebral infarction without residual deficits: Secondary | ICD-10-CM | POA: Diagnosis not present

## 2018-01-13 ENCOUNTER — Telehealth: Payer: Self-pay | Admitting: Family Medicine

## 2018-01-13 NOTE — Telephone Encounter (Signed)
Transition Care Management Follow-up Telephone Call ANGELEEN HORNEY ZES:[id number] DOB: 1940/12/28 DOA: 01/07/2018  PCP: Marletta Lor, MD Patient coming from: home  Chief Complaint: right arm numbness/expressive aphasia  HPI: [name] is a 77 y.o. female with medical history significant for CAD, carotid artery disease, chronic back pain, hypertension, hyperlipidemia, hypothyroidism, takotsubo cardiomyopathy presents to the emergency department with the chief complaint of difficulty speaking accompanied with a right hand numbness and weakness. Initial evaluation included an MRI brain revealing acute infarct. [facility] are asked to admit  Information is obtained from the chart and the patient. She states she was in her usual state of health. She's had chronic intermittent right arm numbness/tingling/weakness for which she seen a neurologist. She is not completely sure that her right hand was any worse and has been in the past but she does remember having difficulty speaking. She says she knew her head but she wanted to say but couldn't tell the words coming out of her mouth were not correct. She became frustrated. Associated symptoms include headache but she says she "always has a headache". In addition she had epigastric pain this she also says is chronic and unchanged. She states she was on the telephone with her sister at the time of the event. She believes it lasted 15-20 minutes. She denies any dizziness syncope or near-syncope. She denies shortness of breath diaphoresis abdominal pain nausea vomiting. She denies any dysuria hematuria frequency or urgency. She denies any numbness tingling of her legs or difficulty walking. His diarrhea constipation melena bright red blood per rectum    How have you been since you were released from the hospital? "okay"   Do you understand why you were in the hospital? yes   Do you understand the discharge instructions? yes   Where were  you discharged to? Home   Items Reviewed:  Medications reviewed: yes  Allergies reviewed: yes  Dietary changes reviewed: yes  Referrals reviewed: yes   Functional Questionnaire:   Activities of Daily Living (ADLs):   She states they are independent in the following: ambulation, bathing and hygiene, feeding, continence, grooming, toileting and dressing States they require assistance with the following: none   Any transportation issues/concerns?: no   Any patient concerns? no   Confirmed importance and date/time of follow-up visits scheduled yes  Provider Appointment booked with Dr. Burnice Logan on 01/28/2018 Tuesday at 11:15 am  Confirmed with patient if condition begins to worsen call PCP or go to the ER.  Patient was given the office number and encouraged to call back with question or concerns.  : yes

## 2018-01-14 DIAGNOSIS — K5790 Diverticulosis of intestine, part unspecified, without perforation or abscess without bleeding: Secondary | ICD-10-CM | POA: Diagnosis not present

## 2018-01-14 DIAGNOSIS — D649 Anemia, unspecified: Secondary | ICD-10-CM | POA: Diagnosis not present

## 2018-01-14 DIAGNOSIS — I10 Essential (primary) hypertension: Secondary | ICD-10-CM | POA: Diagnosis not present

## 2018-01-14 DIAGNOSIS — J449 Chronic obstructive pulmonary disease, unspecified: Secondary | ICD-10-CM | POA: Diagnosis not present

## 2018-01-14 DIAGNOSIS — I25119 Atherosclerotic heart disease of native coronary artery with unspecified angina pectoris: Secondary | ICD-10-CM | POA: Diagnosis not present

## 2018-01-14 DIAGNOSIS — Z8673 Personal history of transient ischemic attack (TIA), and cerebral infarction without residual deficits: Secondary | ICD-10-CM | POA: Diagnosis not present

## 2018-01-14 NOTE — Discharge Summary (Signed)
Physician Discharge Summary  DESSA LEDEE VPX:[id number] DOB: 17-Mar-1941 DOA: 01/07/2018  PCP: Marletta Lor, MD  Admit date: 01/07/2018 Discharge date: 01/10/2018  Time spent: 32minutes  Recommendations for Outpatient Follow-up:  1. PCP in one week 2. Neurology Dr.Xu in [place] 3. Home health PT OT   Discharge Diagnoses:  Principal Problem:   CVA (cerebral vascular accident) ([place])   Cerebral amyloid angiopathy   Multiple punctate small vessel strokes   Hypothyroidism   Dyslipidemia   Essential hypertension   Carotid artery disease (HCC)   COPD (chronic obstructive pulmonary disease) with emphysema (HCC)   Coronary artery disease involving native coronary artery of native heart with angina pectoris (HCC)   Right arm numbness   Discharge Condition: stable  Diet recommendation:heart healthy  Filed Weights   01/07/18 2222 01/08/18 2000  Weight: 46.7 kg (103 lb) 46.5 kg (102 lb 8.2 oz)    History of present illness:  Tykeshia S Welchis a 77 y.o.femalewith a Past Medical History of takotsubo cardiomyopathy; Raynaud's syndrome; Cerebral amyloid angiopathy, hypothyroidism; HLD; HTN; CAD; and carotid stenosis who presents with confusion, expressive aphasia  Hospital Course:   Multiple acute CVA --pt presented with expressive aphasia worsening of chronic intermittent right hand/arm numbness tingling. -mRI noted bilateral lacunar infarcts, involving parasagittal left parietal lobe, inferior left inferior frontal lobe and anterior right temporal lobe suspected to be from small vessel disease by neurology, MRI also showed multiple microhemorrhages -neurology consulted -she is on ASA 81mg  daily prior to admission -per Dr.Xu she has a cerebral amyloid angiopathy and hence is at high risk of bleeding with higher dose antiplatelets or anticoagulants, hence she was continued on aspirin 81 mg alone, continued on statin -Echocardiogram with preserved EF and carotid duplex with 1-39%  stenosis -LDL is 83-continue statin, HbA1c is 5.8 -smoking cessation counseled -PT OT/SLP evaluation completed, setup with home health PT OT at discharge -Advised to follow-up with neurology Dr.Xu as outpatient  Hypertension. -metoprolol resumed  CAD status post stent. - Home medications include aspirin 81 mg. Chart review indicates she was also on Plavix in the past but this was discontinued. No chest pain. EKG as noted above. -continue aspirin, added statin  Right hand/arm numbness. These symptoms have been going on for a while. Chart review indicates she was evaluated and by neurology in October 2018. The MRI consistent with cerebral amyloid angiopathy. She has high risk of cortical ICH. Her episodes are CCA related transient focal neurological episodes (TFNEs). EEGno seizure activity, she was started on Keppra prior to admission by her outpatient neurologist Dr.Xu -Continue Keppra  COPD.  -stable, Continue albuterol as needed  Tobacco use. -Cessation counseling   Hypothyroidism. Home medications include Synthroid. -TSH ok   Consultations:  neurology  Discharge Exam: Vitals:   01/10/18 0132 01/10/18 0524  BP: (!) 167/84 (!) 165/99  Pulse: 64 63  Resp: 16 16  Temp: 98.2 F (36.8 C) 98.2 F (36.8 C)  SpO2: 99% 99%    General: alert awake oriented 3 Cardiovascular: S1-S2 regular rate rhythm Respiratory: clear bilaterally  Discharge Instructions   Discharge Instructions    Ambulatory referral to Neurology   Complete by:  As directed    An appointment is requested in approximately: 6 weeks Follow up with stroke clinic (Dr Leonie Man preferred, if not available, then consider [facility] Chestnut, [facility] or Jaynee Eagles whoever is available) at [facility] in about 6-8 weeks. Thanks.   Diet - low sodium heart healthy   Complete by:  As directed  Increase activity slowly   Complete by:  As directed      Allergies as of 01/10/2018   No Known Allergies     Medication List     STOP taking these medications   ibuprofen 200 MG tablet Commonly known as:  ADVIL,MOTRIN     TAKE these medications   albuterol 108 (90 Base) MCG/ACT inhaler Commonly known as:  PROVENTIL HFA;VENTOLIN HFA Inhale 2 puffs into the lungs every 6 (six) hours as needed for wheezing or shortness of breath.   aspirin EC 81 MG tablet Take 81 mg by mouth every 6 (six) hours as needed for mild pain.   atorvastatin 40 MG tablet Commonly known as:  LIPITOR Take 1 tablet (40 mg total) by mouth daily at 6 PM.   levETIRAcetam 250 MG tablet Commonly known as:  KEPPRA Take one tab nightly for 7 days and then one tab bid for 7 days and then 1.5 tab bid after What changed:    how much to take  how to take this  when to take this  additional instructions   levothyroxine 75 MCG tablet Commonly known as:  SYNTHROID, LEVOTHROID Take 1 tablet (75 mcg total) by mouth daily before breakfast.   metoprolol succinate 50 MG 24 hr tablet Commonly known as:  TOPROL-XL Take 25 mg by mouth daily.   nitroGLYCERIN 0.4 MG SL tablet Commonly known as:  NITROSTAT Place 1 tablet (0.4 mg total) under the tongue every 5 (five) minutes as needed for chest pain.      No Known Allergies Follow-up Information    Garvin Fila, MD. Schedule an appointment as soon as possible for a visit in 6 week(s).   Specialties:  Neurology, Radiology Contact information: [address] [place] [address] [phone]        Marletta Lor, MD. Schedule an appointment as soon as possible for a visit in 1 week(s).   Specialty:  Internal Medicine Contact information: [place] [address] [phone]            The results of significant diagnostics from this hospitalization (including imaging, microbiology, ancillary and laboratory) are listed below for reference.    Significant Diagnostic Studies: Ct Angio Head W Or Wo Contrast  Result Date:  01/08/2018 CLINICAL DATA:  CVA. Scattered punctate acute subcortical infarcts. Left PCA occlusion. Anterior communicating artery aneurysm. EXAM: CT ANGIOGRAPHY HEAD AND NECK TECHNIQUE: Multidetector CT imaging of the head and neck was performed using the standard protocol during bolus administration of intravenous contrast. Multiplanar CT image reconstructions and MIPs were obtained to evaluate the vascular anatomy. Carotid stenosis measurements (when applicable) are obtained utilizing NASCET criteria, using the distal internal carotid diameter as the denominator. CONTRAST:  63mL ISOVUE-370 IOPAMIDOL (ISOVUE-370) INJECTION 76% COMPARISON:  MRI brain and MRA head 01/08/2018. CT chest 04/25/2015. FINDINGS: CTA NECK FINDINGS Aortic arch: A 3 vessel arch configuration is present. Atherosclerotic calcifications are present at the aortic arch without aneurysm or significant stenosis at the great vessel origins. Right carotid system: Atherosclerotic calcifications are present along the course of the right common carotid artery. Dense calcifications are present at the right carotid bifurcation without a significant stenosis relative to the more distal vessel. Cervical right ICA mildly tortuous without significant stenosis. Left carotid system: The left common carotid artery also demonstrates extensive atherosclerotic calcification without a significant stenosis. Atherosclerotic calcification is present at the left carotid bifurcation. There is mild narrowing of the left external carotid artery. There  is no significant stenosis of the left internal carotid artery relative to the more distal vessel. Mild tortuosity is present. Vertebral arteries: The vertebral arteries are codominant. Both vertebral arteries originate from the subclavian arteries without significant stenosis. Atherosclerotic calcifications are present in the proximal left vertebral artery. There is no focal stenosis of either vertebral artery within the neck.  Skeleton: Grade 1 anterolisthesis present at C4-5. Chronic loss of disc height and endplate changes present C5-6 and C6-7 with osseous foraminal narrowing greatest at C5-6, right greater than left. No focal lytic or blastic lesions are present. Vertebral body heights are maintained. Other neck: No focal mucosal or submucosal lesions are present. Epiglottis is within normal limits. The hypopharynx is clear. Vocal cords are midline and symmetric. Trachea is unremarkable. Upper chest: Centrilobular emphysematous changes are present. A 5 mm nodule in the left upper lobe is stable since 2016. No new nodules are present. Review of the MIP images confirms the above findings CTA HEAD FINDINGS Anterior circulation: The internal carotid arteries demonstrate atherosclerotic changes within the cavernous segments. There is no significant stenosis relative to the more distal vessels. The ICA termini are within limits bilaterally. The A1 and M1 segments are normal. There is mild narrowing of the proximal left M1 segment the more distal left M1 segment is normal. A 2 mm posteriorly and superiorly directed aneurysm is present at the left A1-A2 junction. No other focal aneurysm is present. MCA bifurcations are intact bilaterally. ACA and MCA branch vessels are within normal limits. Posterior circulation: The vertebral arteries are codominant. Atherosclerotic calcifications are present along both vertebral arteries. The right PICA origin is visualized and normal. The left PICA originates below the dural margin. The basilar artery is mildly irregular without a significant stenosis. The left posterior cerebral artery is occluded proximally. The right PCA branch vessels are normal. There is marked attenuation of the left posterior cerebral artery. Venous sinuses: The dural sinuses are patent. The straight sinus deep cerebral veins are intact. Cortical veins are unremarkable. Anatomic variants: None. Delayed phase: The postcontrast images  accentuate white matter disease. No pathologic enhancement is present. Review of the MIP images confirms the above findings IMPRESSION: 1. Stable appearance of left PCA occlusion proximally. Collateral vessels are noted. 2. Atherosclerotic changes at the carotid bifurcations bilaterally without significant stenosis. 3. Additional atherosclerotic calcifications present at the aortic arch and cavernous internal carotid artery without a significant stenosis. 4. Mild narrowing of the proximal left M1 segment rather than aneurysmal dilation of the more distal left M1 segment. 5. 2 mm posteriorly and superiorly directed aneurysm from the left A1-A2 junction. Electronically Signed   By: San Morelle M.D.   On: 01/08/2018 12:15   Dg Chest 2 View  Result Date: 01/08/2018 CLINICAL DATA:  Aphasic with right upper extremity numbness EXAM: CHEST  2 VIEW COMPARISON:  May 15, 2017 FINDINGS: Lungs are mildly hyperexpanded. There is no edema or consolidation. Heart size and pulmonary vascularity are normal. No adenopathy. There is an evident coronary artery stent on the left. There is aortic atherosclerosis. No bone lesions evident. IMPRESSION: Aortic atherosclerosis. Lungs mildly hyperexpanded without edema or consolidation. Aortic Atherosclerosis (ICD10-I70.0). Electronically Signed   By: Lowella Grip III M.D.   On: 01/08/2018 08:02   Ct Head Wo Contrast  Result Date: 01/07/2018 CLINICAL DATA:  Intermittent right arm to mouth numbness with effusion EXAM: CT HEAD WITHOUT CONTRAST TECHNIQUE: Contiguous axial images were obtained from the base of the skull through the vertex without intravenous contrast.  COMPARISON:  MRI brain 08/12/2017, CT brain 02/29/2016 FINDINGS: Brain: No acute territorial infarction, hemorrhage or intracranial mass is visualized. Mild atrophy. Extensive hypodensity throughout the periventricular and subcortical white matter. Stable ventricle size. Vascular: No hyperdense vessels. Carotid  vessel calcification. Vertebral artery calcification. Skull: Normal. Negative for fracture or focal lesion. Sinuses/Orbits: No acute finding. Other: None IMPRESSION: 1. No definite CT evidence for acute intracranial abnormality. 2. Extensive hypodensity throughout the bilateral white matter, most likely small vessel ischemic change. Electronically Signed   By: Donavan Foil M.D.   On: 01/07/2018 23:16   Ct Angio Neck W Or Wo Contrast  Result Date: 01/08/2018 CLINICAL DATA:  CVA. Scattered punctate acute subcortical infarcts. Left PCA occlusion. Anterior communicating artery aneurysm. EXAM: CT ANGIOGRAPHY HEAD AND NECK TECHNIQUE: Multidetector CT imaging of the head and neck was performed using the standard protocol during bolus administration of intravenous contrast. Multiplanar CT image reconstructions and MIPs were obtained to evaluate the vascular anatomy. Carotid stenosis measurements (when applicable) are obtained utilizing NASCET criteria, using the distal internal carotid diameter as the denominator. CONTRAST:  26mL ISOVUE-370 IOPAMIDOL (ISOVUE-370) INJECTION 76% COMPARISON:  MRI brain and MRA head 01/08/2018. CT chest 04/25/2015. FINDINGS: CTA NECK FINDINGS Aortic arch: A 3 vessel arch configuration is present. Atherosclerotic calcifications are present at the aortic arch without aneurysm or significant stenosis at the great vessel origins. Right carotid system: Atherosclerotic calcifications are present along the course of the right common carotid artery. Dense calcifications are present at the right carotid bifurcation without a significant stenosis relative to the more distal vessel. Cervical right ICA mildly tortuous without significant stenosis. Left carotid system: The left common carotid artery also demonstrates extensive atherosclerotic calcification without a significant stenosis. Atherosclerotic calcification is present at the left carotid bifurcation. There is mild narrowing of the left  external carotid artery. There is no significant stenosis of the left internal carotid artery relative to the more distal vessel. Mild tortuosity is present. Vertebral arteries: The vertebral arteries are codominant. Both vertebral arteries originate from the subclavian arteries without significant stenosis. Atherosclerotic calcifications are present in the proximal left vertebral artery. There is no focal stenosis of either vertebral artery within the neck. Skeleton: Grade 1 anterolisthesis present at C4-5. Chronic loss of disc height and endplate changes present C5-6 and C6-7 with osseous foraminal narrowing greatest at C5-6, right greater than left. No focal lytic or blastic lesions are present. Vertebral body heights are maintained. Other neck: No focal mucosal or submucosal lesions are present. Epiglottis is within normal limits. The hypopharynx is clear. Vocal cords are midline and symmetric. Trachea is unremarkable. Upper chest: Centrilobular emphysematous changes are present. A 5 mm nodule in the left upper lobe is stable since 2016. No new nodules are present. Review of the MIP images confirms the above findings CTA HEAD FINDINGS Anterior circulation: The internal carotid arteries demonstrate atherosclerotic changes within the cavernous segments. There is no significant stenosis relative to the more distal vessels. The ICA termini are within limits bilaterally. The A1 and M1 segments are normal. There is mild narrowing of the proximal left M1 segment the more distal left M1 segment is normal. A 2 mm posteriorly and superiorly directed aneurysm is present at the left A1-A2 junction. No other focal aneurysm is present. MCA bifurcations are intact bilaterally. ACA and MCA branch vessels are within normal limits. Posterior circulation: The vertebral arteries are codominant. Atherosclerotic calcifications are present along both vertebral arteries. The right PICA origin is visualized and normal. The left  PICA  originates below the dural margin. The basilar artery is mildly irregular without a significant stenosis. The left posterior cerebral artery is occluded proximally. The right PCA branch vessels are normal. There is marked attenuation of the left posterior cerebral artery. Venous sinuses: The dural sinuses are patent. The straight sinus deep cerebral veins are intact. Cortical veins are unremarkable. Anatomic variants: None. Delayed phase: The postcontrast images accentuate white matter disease. No pathologic enhancement is present. Review of the MIP images confirms the above findings IMPRESSION: 1. Stable appearance of left PCA occlusion proximally. Collateral vessels are noted. 2. Atherosclerotic changes at the carotid bifurcations bilaterally without significant stenosis. 3. Additional atherosclerotic calcifications present at the aortic arch and cavernous internal carotid artery without a significant stenosis. 4. Mild narrowing of the proximal left M1 segment rather than aneurysmal dilation of the more distal left M1 segment. 5. 2 mm posteriorly and superiorly directed aneurysm from the left A1-A2 junction. Electronically Signed   By: San Morelle M.D.   On: 01/08/2018 12:15   Mr Brain Wo Contrast (neuro Protocol)  Result Date: 01/08/2018 CLINICAL DATA:  Initial evaluation for intermittent right-sided numbness with aphasia. EXAM: MRI HEAD WITHOUT CONTRAST TECHNIQUE: Multiplanar, multiecho pulse sequences of the brain and surrounding structures were obtained without intravenous contrast. COMPARISON:  Prior CT from 01/07/2018 as well as previous MRI from 08/12/2017. FINDINGS: Brain: Stable atrophy. Extensive T2/FLAIR hyperintensity involving the periventricular, deep, and subcortical white matter both cerebral hemispheres, most consistent with advanced chronic microvascular ischemic disease. Punctate 5 mm focus of diffusion abnormality within the parasagittal left parietal lobe, consistent with a small  acute ischemic infarct (series 3, image 34). Additional punctate cortical infarct within the anterior inferior left frontal lobe (series 3, image 28). Additional 5 mm acute ischemic infarct within the subcortical white matter of the anterior right temporal lobe (series 3, image 16). Additional patchy diffusion abnormality within the subcortical white matter of the right parietal lobe suspicious for acute to subacute small vessel type ischemia (series 3, image 34, 32). No definite associated hemorrhage about these infarcts. No associated mass effect. Extensive chronic micro hemorrhages again seen throughout the brain, most notable within the left parietal region, suggesting cerebral amyloid angiopathy. No definite acute intracranial hemorrhage. No mass lesion, midline shift or mass effect. No hydrocephalus. No extra-axial fluid collection. Major dural sinuses are grossly patent. Pituitary gland suprasellar region normal. Midline structures intact and normal. Vascular: Major intracranial vascular flow voids are maintained. Skull and upper cervical spine: Craniocervical junction within normal limits. Upper cervical spine normal. Bone marrow signal intensity within normal limits. No scalp soft tissue abnormality. Sinuses/Orbits: Globes and orbital soft tissues within normal limits. Paranasal sinuses are clear. No mastoid effusion. Inner ear structures normal. Other: None. IMPRESSION: 1. Scattered punctate subcentimeter acute ischemic infarcts involving the parasagittal left parietal lobe, inferior left frontal lobe, and anterior right temporal lobe as above. No associated hemorrhage or mass effect. 2. Additional small volume mild patchy diffusion abnormality within the subcortical white matter of the right parietal lobe, suspicious for acute to early subacute small vessel type ischemic changes. 3. Extensive chronic micro hemorrhages throughout the brain, greatest within the left parietal region. Findings suggestive of  cerebral amyloid angiopathy. 4. Extensive chronic microvascular ischemic changes. Electronically Signed   By: Jeannine Boga M.D.   On: 01/08/2018 05:51   Mr Jodene Nam Head Wo Contrast  Result Date: 01/08/2018 CLINICAL DATA:  Right hand numbness and weakness. Difficulty speaking. Scattered punctate infarcts in both cerebral hemispheres on  MRI EXAM: MRA HEAD WITHOUT CONTRAST TECHNIQUE: Angiographic images of the Circle of Willis were obtained using MRA technique without intravenous contrast. COMPARISON:  None. FINDINGS: The visualized distal vertebral arteries are patent to the basilar and codominant. Patent PICA and SCA origins are identified bilaterally. The basilar artery is patent with an irregular and mildly ectatic appearance proximally. No significant basilar artery stenosis is identified. Posterior communicating arteries are not identified and may be diminutive or absent. The right PCA is patent without significant proximal stenosis. The left PCA occludes near the P1-P2 junction with some reconstitution of thready/diseased appearing distal branches. The internal carotid arteries are widely patent from skull base to carotid termini. The ACAs and MCAs are patent without evidence of proximal branch occlusion or significant proximal stenosis. There is mild fusiform dilatation of the mid left M1 segment to 3.5 mm diameter, and there is also mild fusiform dilatation of the right MCA at the bifurcation to 4 mm diameter. Mild motion artifact is present in both of these regions which may accentuate the apparent dilatation. There is a 3 x 2 mm aneurysm projecting posteriorly, medially, and superiorly from the left A1-A2 junction. IMPRESSION: 1. Left PCA occlusion near the P1-P2 junction. 2. 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 Head CTA is recommended for  further evaluation of these findings. Electronically Signed   By: Logan Bores M.D.   On: 01/08/2018 08:41   Vas US Carotid (at [place] Only)  Result Date: 01/08/2018 Carotid Arterial Duplex Study Indications:  CVA. Risk Factors: Hypertension, hyperlipidemia, coronary artery disease. Examination Guidelines: A complete evaluation includes B-mode imaging, spectral doppler, color doppler, and power doppler as needed of all accessible portions of each vessel. Bilateral testing is considered an integral part of a complete examination. Limited examinations for reoccurring indications may be performed as noted.  Right Carotid Findings: +----------+-------+-------+--------+---------------------------------+--------+           PSV    EDV    StenosisDescribe                         Comments           cm/s   cm/s                                                     +----------+-------+-------+--------+---------------------------------+--------+ CCA Prox  58     15             smooth and heterogenous                   +----------+-------+-------+--------+---------------------------------+--------+ CCA Distal-50    -14            smooth, heterogenous and calcific         +----------+-------+-------+--------+---------------------------------+--------+ ICA Prox  -74    -28            calcific, irregular and                                                   heterogenous                              +----------+-------+-------+--------+---------------------------------+--------+  ICA Distal-60    -20                                             tortuous +----------+-------+-------+--------+---------------------------------+--------+ ECA       -129   -32            heterogenous and calcific                 +----------+-------+-------+--------+---------------------------------+--------+ +----------+--------+-------+----------------+-------------------+           PSV cm/sEDV  cmsDescribe        Arm Pressure (mmHG) +----------+--------+-------+----------------+-------------------+ [id number]             Multiphasic, WNL                    +----------+--------+-------+----------------+-------------------+ +---------+--------+--+--------+--+---------+ VertebralPSV cm/s47EDV cm/s14Antegrade +---------+--------+--+--------+--+---------+  Left Carotid Findings: +----------+-------+-------+--------+---------------------------------+--------+           PSV    EDV    StenosisDescribe                         Comments           cm/s   cm/s                                                     +----------+-------+-------+--------+---------------------------------+--------+ CCA Prox  57     18             heterogenous and smooth                   +----------+-------+-------+--------+---------------------------------+--------+ CCA Distal-63    -15                                                      +----------+-------+-------+--------+---------------------------------+--------+ ICA Prox  57     18             calcific, irregular and                                                   heterogenous                              +----------+-------+-------+--------+---------------------------------+--------+ ICA Distal-109   -36                                                      +----------+-------+-------+--------+---------------------------------+--------+ ECA       -181   -29            smooth, heterogenous and calcific         +----------+-------+-------+--------+---------------------------------+--------+ +----------+--------+--------+----------------+-------------------+ SubclavianPSV cm/sEDV cm/sDescribe        Arm Pressure (mmHG) +----------+--------+--------+----------------+-------------------+  105             Multiphasic, WNL                     +----------+--------+--------+----------------+-------------------+ +---------+--------+--+--------+--+---------+ VertebralPSV cm/s55EDV cm/s15Antegrade +---------+--------+--+--------+--+---------+  Final Interpretation: Right Carotid: Velocities in the right ICA are consistent with a 1-39% stenosis.                Non-hemodynamically significant plaque <50% noted in the CCA. Left Carotid: Velocities in the left ICA are consistent with a 1-39% stenosis.               Non-hemodynamically significant plaque noted in the CCA. Vertebrals: Both vertebral arteries were patent with antegrade flow. *See table(s) above for measurements and observations.  Electronically signed by Antony Contras on 01/08/2018 at 12:48:55 PM.    Microbiology: No results found for this or any previous visit (from the past 240 hour(s)).   Labs: Basic Metabolic Panel: Recent Labs  Lab 01/07/18 2247 01/07/18 2251  NA 142 141  K 3.7 3.9  CL 101 103  CO2  --  29  GLUCOSE 102* 109*  BUN 14 13  CREATININE 1.00 0.99  CALCIUM  --  9.2   Liver Function Tests: Recent Labs  Lab 01/07/18 2251  AST 19  ALT 13*  ALKPHOS 75  BILITOT 0.6  PROT 5.6*  ALBUMIN 3.6   No results for input(s): LIPASE, AMYLASE in the last 168 hours. No results for input(s): AMMONIA in the last 168 hours. CBC: Recent Labs  Lab 01/07/18 2247 01/07/18 2251  WBC  --  5.4  NEUTROABS  --  2.4  HGB 13.9 13.7  HCT 41.0 43.0  MCV  --  98.9  PLT  --  177   Cardiac Enzymes: No results for input(s): CKTOTAL, CKMB, CKMBINDEX, TROPONINI in the last 168 hours. BNP: BNP (last 3 results) No results for input(s): BNP in the last 8760 hours.  ProBNP (last 3 results) No results for input(s): PROBNP in the last 8760 hours.  CBG: No results for input(s): GLUCAP in the last 168 hours.     Signed:  Domenic Polite MD.  [facility] 01/14/2018, 3:23 PM

## 2018-01-15 ENCOUNTER — Telehealth: Payer: Self-pay | Admitting: Internal Medicine

## 2018-01-15 DIAGNOSIS — Z8673 Personal history of transient ischemic attack (TIA), and cerebral infarction without residual deficits: Secondary | ICD-10-CM | POA: Diagnosis not present

## 2018-01-15 DIAGNOSIS — I25119 Atherosclerotic heart disease of native coronary artery with unspecified angina pectoris: Secondary | ICD-10-CM | POA: Diagnosis not present

## 2018-01-15 DIAGNOSIS — D649 Anemia, unspecified: Secondary | ICD-10-CM | POA: Diagnosis not present

## 2018-01-15 DIAGNOSIS — I10 Essential (primary) hypertension: Secondary | ICD-10-CM | POA: Diagnosis not present

## 2018-01-15 DIAGNOSIS — J449 Chronic obstructive pulmonary disease, unspecified: Secondary | ICD-10-CM | POA: Diagnosis not present

## 2018-01-15 DIAGNOSIS — K5790 Diverticulosis of intestine, part unspecified, without perforation or abscess without bleeding: Secondary | ICD-10-CM | POA: Diagnosis not present

## 2018-01-15 NOTE — Telephone Encounter (Signed)
Copied from [place]. Topic: Quick Communication - See Telephone Encounter >> Jan 15, 2018  3:49 PM Percell Belt A wrote: CRM for notification. See Telephone encounter for:  Isaias Sakai [facility] [phone] Pt had a fall in the home yesterday.  Only has a bruise and a small cut on right elbow   01/15/18.

## 2018-01-16 DIAGNOSIS — D649 Anemia, unspecified: Secondary | ICD-10-CM | POA: Diagnosis not present

## 2018-01-16 DIAGNOSIS — I10 Essential (primary) hypertension: Secondary | ICD-10-CM | POA: Diagnosis not present

## 2018-01-16 DIAGNOSIS — K5790 Diverticulosis of intestine, part unspecified, without perforation or abscess without bleeding: Secondary | ICD-10-CM | POA: Diagnosis not present

## 2018-01-16 DIAGNOSIS — Z8673 Personal history of transient ischemic attack (TIA), and cerebral infarction without residual deficits: Secondary | ICD-10-CM | POA: Diagnosis not present

## 2018-01-16 DIAGNOSIS — J449 Chronic obstructive pulmonary disease, unspecified: Secondary | ICD-10-CM | POA: Diagnosis not present

## 2018-01-16 DIAGNOSIS — I25119 Atherosclerotic heart disease of native coronary artery with unspecified angina pectoris: Secondary | ICD-10-CM | POA: Diagnosis not present

## 2018-01-17 NOTE — Telephone Encounter (Signed)
Noted, to PCP for FYI.

## 2018-01-20 ENCOUNTER — Inpatient Hospital Stay: Payer: Medicare Other | Admitting: Internal Medicine

## 2018-01-20 ENCOUNTER — Ambulatory Visit (INDEPENDENT_AMBULATORY_CARE_PROVIDER_SITE_OTHER): Payer: Medicare Other | Admitting: Internal Medicine

## 2018-01-20 ENCOUNTER — Encounter: Payer: Self-pay | Admitting: Internal Medicine

## 2018-01-20 VITALS — BP 110/68 | HR 60 | Temp 97.7°F | Ht 65.0 in | Wt 100.8 lb

## 2018-01-20 DIAGNOSIS — E039 Hypothyroidism, unspecified: Secondary | ICD-10-CM | POA: Diagnosis not present

## 2018-01-20 DIAGNOSIS — R202 Paresthesia of skin: Secondary | ICD-10-CM

## 2018-01-20 DIAGNOSIS — F17200 Nicotine dependence, unspecified, uncomplicated: Secondary | ICD-10-CM

## 2018-01-20 DIAGNOSIS — I680 Cerebral amyloid angiopathy: Secondary | ICD-10-CM

## 2018-01-20 DIAGNOSIS — I10 Essential (primary) hypertension: Secondary | ICD-10-CM | POA: Diagnosis not present

## 2018-01-20 DIAGNOSIS — R200 Anesthesia of skin: Secondary | ICD-10-CM

## 2018-01-20 MED ORDER — ATORVASTATIN CALCIUM 40 MG PO TABS: 40.0000 mg | ORAL_TABLET | Freq: Every day | ORAL | 3 refills | Status: AC

## 2018-01-20 MED ORDER — ATORVASTATIN CALCIUM 40 MG PO TABS: 40.0000 mg | ORAL_TABLET | Freq: Every day | ORAL | 3 refills | Status: DC

## 2018-01-20 NOTE — Progress Notes (Signed)
Subjective:    Patient ID: [name], female    DOB: 1941-02-08, 77 y.o.   MRN: [id number]  HPI Admit date: 01/07/2018 Discharge date: 01/10/2018 77 year old patient who is seen today following a recent hospital discharge  Recommendations for Outpatient Follow-up:  1. PCP in one week 2. Neurology Dr.Xu in [place] 3. Home health PT OT   Discharge Diagnoses:  Principal Problem:   CVA (cerebral vascular accident) ([place])   Cerebral amyloid angiopathy   Multiple punctate small vessel strokes   Hypothyroidism   Dyslipidemia   Essential hypertension   Carotid artery disease (HCC)   COPD (chronic obstructive pulmonary disease) with emphysema (HCC)   Coronary artery disease involving native coronary artery of native heart with angina pectoris ([place])   Right arm numbness  76 year old patient who is seen today following a recent hospital discharge and for transitional care management.  Hospital records reviewed.  She is accompanied by her son today. Apparently the patient has not taken atorvastatin Since her discharge she has done quite well.  She continues to have rare right arm numbness but no further speech difficulties. The patient was noted to have bilateral lacunar infarcts with multiple microhemorrhages.  She was seen by neurology and felt to have cerebral amyloid angiopathy.  Past Medical History:  Diagnosis Date  . Anemia   . Arthritis   . Blood transfusion without reported diagnosis   . Bronchitis    hx of  . CAD (coronary artery disease)    a. BMS to LAD 2008, multivessel disease // b. LHC 6/17 (ant-lat STEMI): LM ok, pLAD 70, mLAD 50, dLAD 60, oD2 70, oLCx 60, pRCA 30, dRCA 50, EF 35-45% with apical ballooning - no culprit for MI and no PCI performed - prob [place] CM  . Carotid artery disease ([place])    Carotid US 1/24: RICA 58-09%, LICA 9-83% >> follow-up 1 year  . Chronic back pain   . Diverticulitis   . Diverticulosis of colon (without mention of hemorrhage)     . Essential hypertension    Dr. Bluford Kaufmann [phone] at [place]  . Hemorrhage of gastrointestinal tract, unspecified   . History of echocardiogram    a. Echo 05/14/16: Mild focal basal septal hypertrophy, EF 60-65%, normal wall motion, grade 2 diastolic dysfunction, mild TR  . History of non-ST elevation myocardial infarction (NSTEMI) 2008  . History of pneumonia   . Hyperlipidemia   . Hypothyroidism   . 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 Personal history of colonic polyps    Dr. Ardis Hughs, GI  . Raynaud's syndrome   . Takotsubo cardiomyopathy    a. Admitted 6/17 with anterolateral STEMI >> no culprit on LHC and EF 35-45% with apical ballooning // b. Echo prior to discharge with EF 60-65% and normal wall motion //  c. Limited echo 7/17: EF 55-60%, no RWMA, no pericardial effusion  . Ulcer   . Urinary frequency      Social History   Socioeconomic History  . Marital status: Married    Spouse name: Not on file  . Number of children: Not on file  . Years of education: Not on file  . Highest education level: Not on file  Social Needs  . Financial resource strain: Not on file  . Food insecurity - worry: Not on file  . Food insecurity - inability: Not on file  . Transportation needs - medical: Not on file  . Transportation needs - non-medical:  Not on file  Occupational History  . Occupation: HOUSEPAINTER  Tobacco Use  . Smoking status: Current Every Day Smoker    Packs/day: 1.00    Years: 0.00    Pack years: 0.00    Types: Cigarettes  . Smokeless tobacco: Never Used  Substance and Sexual Activity  . Alcohol use: No    Alcohol/week: 0.0 oz  . Drug use: No  . Sexual activity: Not on file  Other Topics Concern  . Not on file  Social History Narrative  . Not on file    Past Surgical History:  Procedure Laterality Date  . ABDOMINAL HYSTERECTOMY    . ABDOMINAL HYSTERECTOMY  1975  . APPENDECTOMY  1955  . BREAST EXCISIONAL  BIOPSY Right 1969   no visible scar  . CARDIAC CATHETERIZATION N/A 05/13/2016   Procedure: Left Heart Cath and Coronary Angiography;  Surgeon: Wellington Hampshire, MD;  Location: [facility];  Service: Cardiovascular;  Laterality: N/A;  . CHOLECYSTECTOMY    . CORONARY ANGIOPLASTY WITH STENT PLACEMENT     x1, 2007or 2008  . GASTROINTESTINAL STOMAL TUMOR RESECTION    . LAPAROSCOPIC PARTIAL COLECTOMY  12/23/2012   Procedure: LAPAROSCOPIC PARTIAL COLECTOMY;  Surgeon: Stark Klein, MD;  Location: [facility];  Service: General;  Laterality: N/A;  . PARTIAL COLECTOMY  12/23/2012  . TUBAL LIGATION      Family History  Problem Relation Age of Onset  . Heart disease Mother        Verdie Drown  . Breast cancer Mother   . Lung cancer Father        SISTER, UNCLE  . Cerebral aneurysm Sister        UNCLE NEPHEW  . Colon cancer Neg Hx   . Esophageal cancer Neg Hx   . Stomach cancer Neg Hx   . Rectal cancer Neg Hx     No Known Allergies  Current Outpatient Medications on File Prior to Visit  Medication Sig Dispense Refill  . albuterol (PROVENTIL HFA;VENTOLIN HFA) 108 (90 Base) MCG/ACT inhaler Inhale 2 puffs into the lungs every 6 (six) hours as needed for wheezing or shortness of breath. 1 Inhaler 0  . aspirin EC 81 MG tablet Take 81 mg by mouth every 6 (six) hours as needed for mild pain.     Marland Kitchen levETIRAcetam (KEPPRA) 250 MG tablet Take one tab nightly for 7 days and then one tab bid for 7 days and then 1.5 tab bid after (Patient taking differently: Take 250-500 mg by mouth 2 (two) times daily. Take one tab nightly for 7 days and then one tab bid for 7 days and then 1.5 tab bid after) 90 tablet 3  . levothyroxine (SYNTHROID, LEVOTHROID) 75 MCG tablet Take 1 tablet (75 mcg total) by mouth daily before breakfast. 90 tablet 2  . metoprolol succinate (TOPROL-XL) 50 MG 24 hr tablet Take 25 mg by mouth daily.     . nitroGLYCERIN (NITROSTAT) 0.4 MG SL tablet Place 1 tablet (0.4 mg total) under the  tongue every 5 (five) minutes as needed for chest pain. 25 tablet 3   No current facility-administered medications on file prior to visit.     BP 110/68 (BP Location: Left Arm, Patient Position: Sitting, Cuff Size: Normal)   Pulse 60   Temp 97.7 F (36.5 C) (Oral)   Ht 5\' 5"  (1.651 m)   Wt 100 lb 12.8 oz (45.7 kg)   BMI 16.77 kg/m      Review of Systems  HENT: Negative for congestion, dental problem, hearing loss, rhinorrhea, sinus pressure, sore throat and tinnitus.   Eyes: Negative for pain, discharge and visual disturbance.  Respiratory: Negative for cough and shortness of breath.   Cardiovascular: Negative for chest pain, palpitations and leg swelling.  Gastrointestinal: Negative for abdominal distention, abdominal pain, blood in stool, constipation, diarrhea, nausea and vomiting.  Genitourinary: Negative for difficulty urinating, dysuria, flank pain, frequency, hematuria, pelvic pain, urgency, vaginal bleeding, vaginal discharge and vaginal pain.  Musculoskeletal: Negative for arthralgias, gait problem and joint swelling.  Skin: Negative for rash.  Neurological: Positive for speech difficulty, weakness and numbness. Negative for dizziness, syncope and headaches.  Hematological: Negative for adenopathy.  Psychiatric/Behavioral: Negative for agitation, behavioral problems and dysphoric mood. The patient is not nervous/anxious.        Objective:   Physical Exam  Constitutional: She is oriented to person, place, and time. She appears well-developed and well-nourished.  Blood pressure 110/68  HENT:  Head: Normocephalic.  Right Ear: External ear normal.  Left Ear: External ear normal.  Mouth/Throat: Oropharynx is clear and moist.  Eyes: Conjunctivae and EOM are normal. Pupils are equal, round, and reactive to light.  Neck: Normal range of motion. Neck supple. No thyromegaly present.  Cardiovascular: Normal rate, regular rhythm, normal heart sounds and intact distal pulses.    Pulmonary/Chest: Effort normal and breath sounds normal.  Abdominal: Soft. Bowel sounds are normal. She exhibits no mass. There is no tenderness.  Musculoskeletal: Normal range of motion.  Lymphadenopathy:    She has no cervical adenopathy.  Neurological: She is alert and oriented to person, place, and time. No cranial nerve deficit. Coordination normal.  Skin: Skin is warm and dry. No rash noted.  Psychiatric: She has a normal mood and affect. Her behavior is normal.          Assessment & Plan:   Status post recent stroke secondary to cerebral amyloid angiopathy.  Resume statin therapy continue low-dose aspirin.  Follow-up neurology hypothyroidism. Continue on supplemental Synthroid Dyslipidemia statin therapy discussed Nicotine dependence.  Patient has tapered her tobacco use we will continue efforts to total cessation  Follow-up 3 months  [place] Pilar Plate

## 2018-01-20 NOTE — Patient Instructions (Addendum)
Limit your sodium (Salt) intake  Smoking tobacco is very bad for your health. You should stop smoking immediately.  Neurology follow-up as scheduled

## 2018-01-23 NOTE — Telephone Encounter (Signed)
[name], OT w/ [facility] still has not heard back about orders for OT. Okay to call w/ verbal order and ok to leave voicemail.

## 2018-01-23 NOTE — Telephone Encounter (Signed)
Okay orders for OT

## 2018-01-24 NOTE — Telephone Encounter (Signed)
Spoke to Ritu and verbalized the okay per Dr.Kwiatkowski.

## 2018-01-27 ENCOUNTER — Ambulatory Visit: Payer: Medicare Other | Admitting: Internal Medicine

## 2018-01-28 ENCOUNTER — Inpatient Hospital Stay: Payer: Medicare Other | Admitting: Internal Medicine

## 2018-01-28 ENCOUNTER — Telehealth: Payer: Self-pay | Admitting: Internal Medicine

## 2018-01-28 NOTE — Telephone Encounter (Signed)
Copied from [place]. Topic: Quick Communication - See Telephone Encounter >> Jan 28, 2018  4:34 PM Arletha Grippe wrote: CRM for notification. See Telephone encounter for:   01/28/18. [facility] from [facility] called - occupational therapy -  Pt is not returning any phone calls. Pt will be discharged from services. Cb # [phone]

## 2018-01-29 DIAGNOSIS — I10 Essential (primary) hypertension: Secondary | ICD-10-CM | POA: Diagnosis not present

## 2018-01-29 DIAGNOSIS — K5790 Diverticulosis of intestine, part unspecified, without perforation or abscess without bleeding: Secondary | ICD-10-CM | POA: Diagnosis not present

## 2018-01-29 DIAGNOSIS — D649 Anemia, unspecified: Secondary | ICD-10-CM | POA: Diagnosis not present

## 2018-01-29 DIAGNOSIS — I25119 Atherosclerotic heart disease of native coronary artery with unspecified angina pectoris: Secondary | ICD-10-CM | POA: Diagnosis not present

## 2018-01-29 DIAGNOSIS — Z8673 Personal history of transient ischemic attack (TIA), and cerebral infarction without residual deficits: Secondary | ICD-10-CM | POA: Diagnosis not present

## 2018-01-29 DIAGNOSIS — J449 Chronic obstructive pulmonary disease, unspecified: Secondary | ICD-10-CM | POA: Diagnosis not present

## 2018-01-29 NOTE — Telephone Encounter (Signed)
Spoke with Ritu and she states that the patient is not returning phone calls and declined speech therapy. She states that she is concerned about the patient because she really needs it. Will give ritu an update when I speak to the patient.

## 2018-01-29 NOTE — Telephone Encounter (Signed)
Left message for patient to return phone call. Calling Ritu now.

## 2018-01-31 NOTE — Telephone Encounter (Signed)
[place] patient husband stated that his wife is "old and stubborn" and so she was denying speech and occupational therapy. Husband put [name] on the phone for clarification. Patient stated that Ritu and herself, didn't think she needed speech therapy so she didn't know why she told the office that she declined. The patient also stated that she did want the OT but Ritu never called her. Then the patient said that she didn't won't to do any therapy and she felt as if she didn't need any of it. I informed the patient that I would let Ritu and Dr.Kwiatkowski aware of the update.

## 2018-01-31 NOTE — Telephone Encounter (Signed)
Spoke with Ritu and informed her of the update. Ritu stated that she has already discharged the patient. No further action needed.

## 2018-02-03 DIAGNOSIS — H903 Sensorineural hearing loss, bilateral: Secondary | ICD-10-CM | POA: Diagnosis not present

## 2018-02-03 DIAGNOSIS — H61393 Other acquired stenosis of external ear canal, bilateral: Secondary | ICD-10-CM | POA: Diagnosis not present

## 2018-02-03 DIAGNOSIS — H6123 Impacted cerumen, bilateral: Secondary | ICD-10-CM | POA: Diagnosis not present

## 2018-02-05 DIAGNOSIS — Z8673 Personal history of transient ischemic attack (TIA), and cerebral infarction without residual deficits: Secondary | ICD-10-CM | POA: Diagnosis not present

## 2018-02-05 DIAGNOSIS — I25119 Atherosclerotic heart disease of native coronary artery with unspecified angina pectoris: Secondary | ICD-10-CM | POA: Diagnosis not present

## 2018-02-05 DIAGNOSIS — J449 Chronic obstructive pulmonary disease, unspecified: Secondary | ICD-10-CM | POA: Diagnosis not present

## 2018-02-05 DIAGNOSIS — K5790 Diverticulosis of intestine, part unspecified, without perforation or abscess without bleeding: Secondary | ICD-10-CM | POA: Diagnosis not present

## 2018-02-05 DIAGNOSIS — I10 Essential (primary) hypertension: Secondary | ICD-10-CM | POA: Diagnosis not present

## 2018-02-05 DIAGNOSIS — D649 Anemia, unspecified: Secondary | ICD-10-CM | POA: Diagnosis not present

## 2018-02-19 DIAGNOSIS — Z8673 Personal history of transient ischemic attack (TIA), and cerebral infarction without residual deficits: Secondary | ICD-10-CM | POA: Diagnosis not present

## 2018-02-19 DIAGNOSIS — K5790 Diverticulosis of intestine, part unspecified, without perforation or abscess without bleeding: Secondary | ICD-10-CM | POA: Diagnosis not present

## 2018-02-19 DIAGNOSIS — I25119 Atherosclerotic heart disease of native coronary artery with unspecified angina pectoris: Secondary | ICD-10-CM | POA: Diagnosis not present

## 2018-02-19 DIAGNOSIS — D649 Anemia, unspecified: Secondary | ICD-10-CM | POA: Diagnosis not present

## 2018-02-19 DIAGNOSIS — I10 Essential (primary) hypertension: Secondary | ICD-10-CM | POA: Diagnosis not present

## 2018-02-19 DIAGNOSIS — J449 Chronic obstructive pulmonary disease, unspecified: Secondary | ICD-10-CM | POA: Diagnosis not present

## 2018-03-08 ENCOUNTER — Other Ambulatory Visit: Payer: Self-pay | Admitting: Internal Medicine

## 2018-04-07 ENCOUNTER — Other Ambulatory Visit: Payer: Self-pay | Admitting: Internal Medicine

## 2018-04-07 DIAGNOSIS — I6523 Occlusion and stenosis of bilateral carotid arteries: Secondary | ICD-10-CM

## 2018-04-10 ENCOUNTER — Other Ambulatory Visit: Payer: Self-pay | Admitting: Internal Medicine

## 2018-04-10 DIAGNOSIS — Z1231 Encounter for screening mammogram for malignant neoplasm of breast: Secondary | ICD-10-CM

## 2018-04-16 ENCOUNTER — Inpatient Hospital Stay (HOSPITAL_COMMUNITY): Admission: RE | Admit: 2018-04-16 | Payer: Medicare Other | Source: Ambulatory Visit

## 2018-04-21 ENCOUNTER — Other Ambulatory Visit: Payer: Self-pay | Admitting: Internal Medicine

## 2018-04-22 NOTE — Progress Notes (Deleted)
[facility]  PATIENT: [name] DOB: 07/26/1941   REASON FOR VISIT: *** HISTORY FROM:    HISTORY OF PRESENT ILLNESS: 12/24/17 Dr. Jonelle Sports is a 77 y.o. 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 She is accompanied by her son.   Pt stated that she had total 3 episodes of right sided numbness starting about 6 weeks ago, the last one was one week ago. The right hand starting to clutch, drawing up, with numbness, which migrating to right arm, shoulder and then right face. No pain, no shaking jerking, no LOC. Lasting 10 min and resolved. No leg involvement. She went to see her cardiologist Dr. Burt Knack and had MRI done showed no acute stroke but numerous bilateral CMBs, L>R. She was referred here for further evaluation.   Pt has CAD s/p stent, was on ASA and plavix in the past, but now on ASA 81, followed with cardiology and stable. HTN but did not check BP at home and today in clinic 140/90. Once a while takes advil for pain. Has bell's palsy 20 years ago on the left, no residue. Has hypothyroidism on synthroid. Son did admit that pt has mild memory issue and confusion for the last 1-2 years.  Interval history During the interval time, patient only had one episode of mild right sided numbness.  EEG negative for seizure. Patient still on aspirin 81, however, not compliant with Keppra.  She only took [place] and rare occasions.  BP 143/79 today, not checking BP at home.  Major complaint of the day is headache.  She stated that her headache has been going on for a long time, throbbing pain, whole head hurts including frontal and the back of the head.  Medication not quite effective.  On examination, patient has  significant bilateral occipital neuralgia.  However, patient  declined occipital nerve block.  Not willing to quit smoke at this time.    REVIEW OF SYSTEMS: Full 14 system review of systems performed and notable only for those listed, all others are neg:  Constitutional: neg  Cardiovascular: neg Ear/Nose/Throat: neg  Skin: neg Eyes: neg Respiratory: neg Gastroitestinal: neg  Hematology/Lymphatic: neg  Endocrine: neg Musculoskeletal:neg Allergy/Immunology: neg Neurological: neg Psychiatric: neg Sleep : neg   ALLERGIES: No Known Allergies  HOME MEDICATIONS: Outpatient Medications Prior to Visit  Medication Sig Dispense Refill  . albuterol (PROVENTIL HFA;VENTOLIN HFA) 108 (90 Base) MCG/ACT inhaler Inhale 2 puffs into the lungs every 6 (six) hours as needed for wheezing or shortness of breath. 1 Inhaler 0  . aspirin EC 81 MG tablet Take 81 mg by mouth every 6 (six) hours as needed for mild pain.     Marland Kitchen atorvastatin (LIPITOR) 40 MG tablet Take 1 tablet (40 mg total) by mouth daily at 6 PM. 90 tablet 3  . levETIRAcetam (KEPPRA) 250 MG tablet Take one tab nightly for 7 days and then one tab bid for 7 days and then 1.5 tab bid after (Patient taking differently: Take 250-500 mg by mouth 2 (two) times daily. Take one tab nightly for 7 days and then one tab bid for 7 days and then 1.5 tab bid after) 90 tablet 3  . levothyroxine (SYNTHROID, LEVOTHROID) 75 MCG tablet TAKE 1 TABLET(75 MCG) BY MOUTH DAILY BEFORE BREAKFAST 90 tablet 0  .  metoprolol succinate (TOPROL-XL) 50 MG 24 hr tablet Take 25 mg by mouth daily.     . nitroGLYCERIN (NITROSTAT) 0.4 MG SL tablet Place 1 tablet (0.4 mg total) under the tongue every 5 (five) minutes as needed for chest pain. 25 tablet 3   No facility-administered medications prior to visit.     PAST MEDICAL HISTORY: Past Medical History:  Diagnosis Date  . Anemia   . Arthritis   . Blood transfusion without reported diagnosis   . Bronchitis    hx of  . CAD (coronary artery disease)    a. BMS to LAD 2008,  multivessel disease // b. LHC 6/17 (ant-lat STEMI): LM ok, pLAD 70, mLAD 50, dLAD 60, oD2 70, oLCx 60, pRCA 30, dRCA 50, EF 35-45% with apical ballooning - no culprit for MI and no PCI performed - prob Tako[place]subo CM  . Carotid artery disease ([place])    Carotid US 4/58: RICA 09-98%, LICA 3-38% >> follow-up 1 year  . Chronic back pain   . Diverticulitis   . Diverticulosis of colon (without mention of hemorrhage)   . Essential hypertension    Dr. Bluford Kaufmann [phone] at [facility]  . Hemorrhage of gastrointestinal tract, unspecified   . History of echocardiogram    a. Echo 05/14/16: Mild focal basal septal hypertrophy, EF 60-65%, normal wall motion, grade 2 diastolic dysfunction, mild TR  . History of non-ST elevation myocardial infarction (NSTEMI) 2008  . History of pneumonia   . Hyperlipidemia   . Hypothyroidism   . 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 Personal history of colonic polyps    Dr. Ardis Hughs, GI  . Raynaud's syndrome   . Takotsubo cardiomyopathy    a. Admitted 6/17 with anterolateral STEMI >> no culprit on LHC and EF 35-45% with apical ballooning // b. Echo prior to discharge with EF 60-65% and normal wall motion //  c. Limited echo 7/17: EF 55-60%, no RWMA, no pericardial effusion  . Ulcer   . Urinary frequency     PAST SURGICAL HISTORY: Past Surgical History:  Procedure Laterality Date  . ABDOMINAL HYSTERECTOMY    . ABDOMINAL HYSTERECTOMY  1975  . APPENDECTOMY  1955  . BREAST EXCISIONAL BIOPSY Right 1969   no visible scar  . CARDIAC CATHETERIZATION N/A 05/13/2016   Procedure: Left Heart Cath and Coronary Angiography;  Surgeon: Wellington Hampshire, MD;  Location: [facility];  Service: Cardiovascular;  Laterality: N/A;  . CHOLECYSTECTOMY    . CORONARY ANGIOPLASTY WITH STENT PLACEMENT     x1, 2007or 2008  . GASTROINTESTINAL STOMAL TUMOR RESECTION    . LAPAROSCOPIC PARTIAL COLECTOMY  12/23/2012   Procedure: LAPAROSCOPIC PARTIAL  COLECTOMY;  Surgeon: Stark Klein, MD;  Location: [place];  Service: General;  Laterality: N/A;  . PARTIAL COLECTOMY  12/23/2012  . TUBAL LIGATION      FAMILY HISTORY: Family History  Problem Relation Age of Onset  . Heart disease Mother        Verdie Drown  . Breast cancer Mother   . Lung cancer Father        SISTER, UNCLE  . Cerebral aneurysm Sister        UNCLE NEPHEW  . Colon cancer Neg Hx   . Esophageal cancer Neg Hx   . Stomach cancer Neg Hx   . Rectal cancer Neg Hx     SOCIAL HISTORY: Social History   Socioeconomic History  . Marital status: Married  Spouse name: Not on file  . Number of children: Not on file  . Years of education: Not on file  . Highest education level: Not on file  Occupational History  . Occupation: [employer]  Social Needs  . Financial resource strain: Not on file  . Food insecurity:    Worry: Not on file    Inability: Not on file  . Transportation needs:    Medical: Not on file    Non-medical: Not on file  Tobacco Use  . Smoking status: Current Every Day Smoker    Packs/day: 1.00    Years: 0.00    Pack years: 0.00    Types: Cigarettes  . Smokeless tobacco: Never Used  Substance and Sexual Activity  . Alcohol use: No    Alcohol/week: 0.0 oz  . Drug use: No  . Sexual activity: Not on file  Lifestyle  . Physical activity:    Days per week: Not on file    Minutes per session: Not on file  . Stress: Not on file  Relationships  . Social connections:    Talks on phone: Not on file    Gets together: Not on file    Attends religious service: Not on file    Active member of club or organization: Not on file    Attends meetings of clubs or organizations: Not on file    Relationship status: Not on file  . Intimate partner violence:    Fear of current or ex partner: Not on file    Emotionally abused: Not on file    Physically abused: Not on file    Forced sexual activity: Not on file  Other Topics Concern  . Not on file    Social History Narrative  . Not on file     PHYSICAL EXAM  There were no vitals filed for this visit. There is no height or weight on file to calculate BMI.  Generalized: Well developed, in no acute distress  Head: normocephalic and atraumatic,. Oropharynx benign  Neck: Supple, no carotid bruits  Cardiac: Regular rate rhythm, no murmur  Musculoskeletal: No deformity   Neurological examination   Mentation: Alert oriented to time, place, history taking. Attention span and concentration appropriate. Recent and remote memory intact.  Follows all commands speech and language fluent.   Cranial nerve II-XII: Fundoscopic exam reveals sharp disc margins.Pupils were equal round reactive to light extraocular movements were full, visual field were full on confrontational test. Facial sensation and strength were normal. hearing was intact to finger rubbing bilaterally. Uvula tongue midline. head turning and shoulder shrug were normal and symmetric.Tongue protrusion into cheek strength was normal. Motor: normal bulk and tone, full strength in the BUE, BLE, fine finger movements normal, no pronator drift. No focal weakness Sensory: normal and symmetric to light touch, pinprick, and  Vibration, proprioception  Coordination: finger-nose-finger, heel-to-shin bilaterally, no dysmetria Reflexes: Brachioradialis 2/2, biceps 2/2, triceps 2/2, patellar 2/2, Achilles 2/2, plantar responses were flexor bilaterally. Gait and Station: Rising up from seated position without assistance, normal stance,  moderate stride, good arm swing, smooth turning, able to perform tiptoe, and heel walking without difficulty. Tandem gait is steady  DIAGNOSTIC DATA (LABS, IMAGING, TESTING) - I reviewed patient records, labs, notes, testing and imaging myself where available.  Lab Results  Component Value Date   WBC 5.4 01/07/2018   HGB 13.7 01/07/2018   HCT 43.0 01/07/2018   MCV 98.9 01/07/2018   PLT 177 01/07/2018  Component Value Date/Time   NA 141 01/07/2018 2251   NA 140 08/01/2017 1006   K 3.9 01/07/2018 2251   CL 103 01/07/2018 2251   CO2 29 01/07/2018 2251   GLUCOSE 109 (H) 01/07/2018 2251   BUN 13 01/07/2018 2251   BUN 18 08/01/2017 1006   CREATININE 0.99 01/07/2018 2251   CREATININE 0.93 09/27/2016 1459   CALCIUM 9.2 01/07/2018 2251   PROT 5.6 (L) 01/07/2018 2251   ALBUMIN 3.6 01/07/2018 2251   AST 19 01/07/2018 2251   ALT 13 (L) 01/07/2018 2251   ALKPHOS 75 01/07/2018 2251   BILITOT 0.6 01/07/2018 2251   GFRNONAA 54 (L) 01/07/2018 2251   GFRAA >60 01/07/2018 2251   Lab Results  Component Value Date   CHOL 158 01/07/2018   HDL 56 01/07/2018   LDLCALC 83 01/07/2018   LDLDIRECT 181.6 08/26/2013   TRIG 96 01/07/2018   CHOLHDL 2.8 01/07/2018   Lab Results  Component Value Date   HGBA1C 5.8 (H) 01/07/2018   Lab Results  Component Value Date   VITAMINB12 >1500 (H) 07/16/2016   Lab Results  Component Value Date   TSH 3.557 01/07/2018      ASSESSMENT AND PLAN     [name] is a 77 y.o. female with PMH of CAD s/p stent on ASA 81, HTN, left bell's palsy without residue, CHF, HLD, carotid stenosis, smoker and hypothyroidism who presents as a new patient for episodes of right hand clutching, right sided numbness and abnormal MRI findings. MRI brain showed no acute stroke but numerous bilateral CMBs, L>R. The MRI consistent with cerebral amyloid angiopathy. She has high risk of cortical ICH. Her episodes are CCA related transient focal neurological episodes (TFNEs). EEG no seizure activity, add keppra, and avoid antiplatelets or anticoagulation. OK with ASA 81mg  only. BP goal < 140/90, and she has not quit smoking yet. Pt not compliant with keppra at all. Complains of HA and found to have bilateral occipital neuralgia, however, she declined occipital nerve block.  Plan: - continue ASA 81mg  for cardiac prevention. But will not recommend any further blood thinners beyond ASA  81mg  unless absolutely indicated. - recommend to have blood pressure cuff at home and check BP regularly, BP goal < 140/90.  - you have occipital neuralgia, avoid compression on the right side of neck. Consider occipital nerve block next time if interested. But can take tylenol for the pain.  - start keppra with 250mg  daily at night for 7 days and then 250mg  twice a day. Take with food to avoid GI side effects - follow up with Dr. Burt Knack and PCP. - recommend to quit smoking completely. - follow up in 4 months.   Dennie Bible, [facility], [facility], APRN  [facility] [address] [place], [place] [address] [phone]

## 2018-04-23 ENCOUNTER — Ambulatory Visit: Payer: Medicare Other | Admitting: Nurse Practitioner

## 2018-04-23 ENCOUNTER — Telehealth: Payer: Self-pay | Admitting: *Deleted

## 2018-04-23 NOTE — Telephone Encounter (Signed)
Patient called this morning and cancelled her follow up today. She rescheduled.

## 2018-04-24 ENCOUNTER — Encounter: Payer: Self-pay | Admitting: Nurse Practitioner

## 2018-04-29 NOTE — Progress Notes (Signed)
[facility]  PATIENT: [name] DOB: Sep 10, 1941   REASON FOR VISIT: hospital stroke follow up  HISTORY FROM: patient and son    HISTORY OF PRESENT ILLNESS: 12/24/17 Dr. Jonelle Sports is a 77 y.o. 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 She is accompanied by her son.   Pt stated that she had total 3 episodes of right sided numbness starting about 6 weeks ago, the last one was one week ago. The right hand starting to clutch, drawing up, with numbness, which migrating to right arm, shoulder and then right face. No pain, no shaking jerking, no LOC. Lasting 10 min and resolved. No leg involvement. She went to see her cardiologist Dr. Burt Knack and had MRI done showed no acute stroke but numerous bilateral CMBs, L>R. She was referred here for further evaluation.   Pt has CAD s/p stent, was on ASA and plavix in the past, but now on ASA 81, followed with cardiology and stable. HTN but did not check BP at home and today in clinic 140/90. Once a while takes advil for pain. Has bell's palsy 20 years ago on the left, no residue. Has hypothyroidism on synthroid. Son did admit that pt has mild memory issue and confusion for the last 1-2 years.  09/17/17 Dr. Erlinda Hong: During the interval time, patient only had one episode of mild right sided numbness.  EEG negative for seizure. Patient still on aspirin 81, however, not compliant with Keppra.  She only took [place] and rare occasions.  BP 143/79 today, not checking BP at home.  Major complaint of the day is headache.  She stated that her headache has been going on for a long time, throbbing pain, whole head hurts including frontal and the back of the head.  Medication not quite effective.  On examination, patient has  significant bilateral  occipital neuralgia.  However, patient declined occipital nerve block.  Not willing to quit smoke at this time.  ADMISSION 01/08/18:  [name] is a 77 y.o. female with PMH of  CAD s/p stenting on ASA 81mg , episodic right hand numbness, and recent diagnosis of cerebral amyloid angiopathy who presented with transient episode of expressive aphasia that has resolved after about 15 minutes.  MRI reveals scattered punctate subcentimeter acute ischemic infarcts involving the parasagittal left parietal lobe, anterior left frontal lobe and anterior right temporal lobe.  MRA showed left PCA occlusion of the P1 to P2 junction.  Suspected etiology was likely small vessel disease.  Resultant symptoms or dysarthria and right hand numbness.  2D echo showed an EF of 65 to 70%.  Bilateral carotid ultrasound showed right ICA stenosis of 1 to 39% along with nonhemodynamically significant plaque less than 50% noted in the CCA and left carotid showed stenosis of 1 to 39%.  Patient was on aspirin PTA and recommended to continue aspirin 81 mg due to increased risk of ICH with antiplatelet/AC and cerebral amyloid angiopathy.  LDL 83 and recommend discharge on Lipitor 40 mg.  A1c satisfactory at 5.8.  Patient discharged home in stable condition.  04/30/18 UPDATE: Patient returns today for follow-up appointment and is accompanied by her son.  Patient states she has headaches daily and they are in the right side temple where they are worse at night and she  takes  Advil to help relieve the pain.  She has not been taking aspirin regularly and it was listed on her discharge paperwork to take as needed.  Does continue to take Keppra but states a few days ago she had right-sided arm numbness where she took 3 aspirin 81 mg and a half tab Keppra and it resolved shortly after this.  Continues to take Lipitor without side effects of myalgias.  Blood pressure today satisfactory at 110/65.  Patient seems to not be compliant with medications  consistently due to GI issues.  Denies new or worsening stroke/TIA symptoms.   REVIEW OF SYSTEMS: Full 14 system review of systems performed and notable only for those listed, all others are neg:  Constitutional: neg  Cardiovascular: neg Ear/Nose/Throat: neg  Skin: neg Eyes: neg Respiratory: neg Gastroitestinal: neg  Hematology/Lymphatic: neg  Endocrine: neg Musculoskeletal: back pain, neck pain Allergy/Immunology: neg Neurological: Headache, numbness Psychiatric: neg Sleep : neg   ALLERGIES: No Known Allergies  HOME MEDICATIONS: Outpatient Medications Prior to Visit  Medication Sig Dispense Refill  . albuterol (PROVENTIL HFA;VENTOLIN HFA) 108 (90 Base) MCG/ACT inhaler Inhale 2 puffs into the lungs every 6 (six) hours as needed for wheezing or shortness of breath. 1 Inhaler 0  . aspirin EC 81 MG tablet Take 81 mg by mouth daily.    Marland Kitchen atorvastatin (LIPITOR) 40 MG tablet Take 1 tablet (40 mg total) by mouth daily at 6 PM. 90 tablet 3  . levETIRAcetam (KEPPRA) 250 MG tablet Take one tab nightly for 7 days and then one tab bid for 7 days and then 1.5 tab bid after (Patient taking differently: Take 250-500 mg by mouth 2 (two) times daily. Take one tab nightly for 7 days and then one tab bid for 7 days and then 1.5 tab bid after) 90 tablet 3  . levothyroxine (SYNTHROID, LEVOTHROID) 75 MCG tablet TAKE 1 TABLET(75 MCG) BY MOUTH DAILY BEFORE BREAKFAST 90 tablet 0  . metoprolol succinate (TOPROL-XL) 25 MG 24 hr tablet TAKE 1 TABLET BY MOUTH DAILY 90 tablet 0  . nitroGLYCERIN (NITROSTAT) 0.4 MG SL tablet Place 1 tablet (0.4 mg total) under the tongue every 5 (five) minutes as needed for chest pain. 25 tablet 3  . metoprolol succinate (TOPROL-XL) 50 MG 24 hr tablet Take 25 mg by mouth daily.      No facility-administered medications prior to visit.     PAST MEDICAL HISTORY: Past Medical History:  Diagnosis Date  . Anemia   . Arthritis   . Blood transfusion without reported diagnosis     . Bronchitis    hx of  . CAD (coronary artery disease)    a. BMS to LAD 2008, multivessel disease // b. LHC 6/17 (ant-lat STEMI): LM ok, pLAD 70, mLAD 50, dLAD 60, oD2 70, oLCx 60, pRCA 30, dRCA 50, EF 35-45% with apical ballooning - no culprit for MI and no PCI performed - prob [place]o CM  . Carotid artery disease ([place])    Carotid US 7/56: RICA 43-32%, LICA 9-51% >> follow-up 1 year  . Chronic back pain   . Diverticulitis   . Diverticulosis of colon (without mention of hemorrhage)   . Essential hypertension    Dr. Bluford Kaufmann [phone] at [place]  . Hemorrhage of gastrointestinal tract, unspecified   . History of echocardiogram    a. Echo 05/14/16: Mild focal basal septal hypertrophy, EF 60-65%, normal wall motion, grade 2 diastolic dysfunction, mild TR  . History of non-ST elevation myocardial infarction (  NSTEMI) 2008  . History of pneumonia   . Hyperlipidemia   . Hypothyroidism   . 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 Personal history of colonic polyps    Dr. Ardis Hughs, GI  . Raynaud's syndrome   . Takotsubo cardiomyopathy    a. Admitted 6/17 with anterolateral STEMI >> no culprit on LHC and EF 35-45% with apical ballooning // b. Echo prior to discharge with EF 60-65% and normal wall motion //  c. Limited echo 7/17: EF 55-60%, no RWMA, no pericardial effusion  . Ulcer   . Urinary frequency     PAST SURGICAL HISTORY: Past Surgical History:  Procedure Laterality Date  . ABDOMINAL HYSTERECTOMY    . ABDOMINAL HYSTERECTOMY  1975  . APPENDECTOMY  1955  . BREAST EXCISIONAL BIOPSY Right 1969   no visible scar  . CARDIAC CATHETERIZATION N/A 05/13/2016   Procedure: Left Heart Cath and Coronary Angiography;  Surgeon: Wellington Hampshire, MD;  Location: [facility];  Service: Cardiovascular;  Laterality: N/A;  . CHOLECYSTECTOMY    . CORONARY ANGIOPLASTY WITH STENT PLACEMENT     x1, 2007or 2008  . GASTROINTESTINAL STOMAL TUMOR RESECTION     . LAPAROSCOPIC PARTIAL COLECTOMY  12/23/2012   Procedure: LAPAROSCOPIC PARTIAL COLECTOMY;  Surgeon: Stark Klein, MD;  Location: [facility];  Service: General;  Laterality: N/A;  . PARTIAL COLECTOMY  12/23/2012  . TUBAL LIGATION      FAMILY HISTORY: Family History  Problem Relation Age of Onset  . Heart disease Mother        Verdie Drown  . Breast cancer Mother   . Lung cancer Father        SISTER, UNCLE  . Cerebral aneurysm Sister        UNCLE NEPHEW  . Colon cancer Neg Hx   . Esophageal cancer Neg Hx   . Stomach cancer Neg Hx   . Rectal cancer Neg Hx     SOCIAL HISTORY: Social History   Socioeconomic History  . Marital status: Married    Spouse name: Not on file  . Number of children: Not on file  . Years of education: Not on file  . Highest education level: Not on file  Occupational History  . Occupation: [employer]  Social Needs  . Financial resource strain: Not on file  . Food insecurity:    Worry: Not on file    Inability: Not on file  . Transportation needs:    Medical: Not on file    Non-medical: Not on file  Tobacco Use  . Smoking status: Current Every Day Smoker    Packs/day: 1.00    Years: 0.00    Pack years: 0.00    Types: Cigarettes  . Smokeless tobacco: Never Used  Substance and Sexual Activity  . Alcohol use: No    Alcohol/week: 0.0 oz  . Drug use: No  . Sexual activity: Not on file  Lifestyle  . Physical activity:    Days per week: Not on file    Minutes per session: Not on file  . Stress: Not on file  Relationships  . Social connections:    Talks on phone: Not on file    Gets together: Not on file    Attends religious service: Not on file    Active member of club or organization: Not on file    Attends meetings of clubs or organizations: Not on file    Relationship status: Not on file  .  Intimate partner violence:    Fear of current or ex partner: Not on file    Emotionally abused: Not on file    Physically abused: Not on file     Forced sexual activity: Not on file  Other Topics Concern  . Not on file  Social History Narrative  . Not on file     PHYSICAL EXAM  Vitals:   04/30/18 1521  BP: 110/65  Pulse: 78  Weight: 100 lb 9.6 oz (45.6 kg)   Body mass index is 16.74 kg/m.  Generalized: Frail elderly Caucasian female, in no acute distress  Head: normocephalic and atraumatic,. Oropharynx benign  Neck: Supple, no carotid bruits  Cardiac: Regular rate rhythm, no murmur  Musculoskeletal: No deformity   Neurological examination   Mentation: Alert oriented to time, place, history taking. Attention span and concentration appropriate. Recent and remote memory intact.  Follows all commands speech and language fluent.   Cranial nerve II-XII: Fundoscopic exam reveals sharp disc margins.Pupils were equal round reactive to light extraocular movements were full, visual field were full on confrontational test. Facial sensation and strength were normal. hearing was intact to finger rubbing bilaterally. Uvula tongue midline. head turning and shoulder shrug were normal and symmetric.Tongue protrusion into cheek strength was normal. Motor: normal bulk and tone, full strength in the BUE, BLE, fine finger movements normal, no pronator drift. No focal weakness Sensory: normal and symmetric to light touch, pinprick, and  Vibration, proprioception  Coordination: finger-nose-finger, heel-to-shin bilaterally, no dysmetria Reflexes: Brachioradialis 2/2, biceps 2/2, triceps 2/2, patellar 2/2, Achilles 2/2, plantar responses were flexor bilaterally. Gait and Station: Rising up from seated position without assistance, normal stance,  moderate stride, good arm swing, smooth turning, able to perform tiptoe, and heel walking without difficulty. Tandem gait is steady  DIAGNOSTIC DATA (LABS, IMAGING, TESTING) - I reviewed patient records, labs, notes, testing and imaging myself where available.  Lab Results  Component Value Date   WBC  5.4 01/07/2018   HGB 13.7 01/07/2018   HCT 43.0 01/07/2018   MCV 98.9 01/07/2018   PLT 177 01/07/2018      Component Value Date/Time   NA 141 01/07/2018 2251   NA 140 08/01/2017 1006   K 3.9 01/07/2018 2251   CL 103 01/07/2018 2251   CO2 29 01/07/2018 2251   GLUCOSE 109 (H) 01/07/2018 2251   BUN 13 01/07/2018 2251   BUN 18 08/01/2017 1006   CREATININE 0.99 01/07/2018 2251   CREATININE 0.93 09/27/2016 1459   CALCIUM 9.2 01/07/2018 2251   PROT 5.6 (L) 01/07/2018 2251   ALBUMIN 3.6 01/07/2018 2251   AST 19 01/07/2018 2251   ALT 13 (L) 01/07/2018 2251   ALKPHOS 75 01/07/2018 2251   BILITOT 0.6 01/07/2018 2251   GFRNONAA 54 (L) 01/07/2018 2251   GFRAA >60 01/07/2018 2251   Lab Results  Component Value Date   CHOL 158 01/07/2018   HDL 56 01/07/2018   LDLCALC 83 01/07/2018   LDLDIRECT 181.6 08/26/2013   TRIG 96 01/07/2018   CHOLHDL 2.8 01/07/2018   Lab Results  Component Value Date   HGBA1C 5.8 (H) 01/07/2018   Lab Results  Component Value Date   VITAMINB12 >1500 (H) 07/16/2016   Lab Results  Component Value Date   TSH 3.557 01/07/2018      ASSESSMENT AND PLAN     [name] is a 77 y.o. female with PMH of CAD s/p stent on ASA 8, HTN, left bell's palsy without residue, CHF,  HLD, carotid stenosis, smoker and hypothyroidism who presents as a new patient for episodes of right hand clutching, right sided numbness and abnormal MRI findings. MRI brain showed no acute stroke but numerous bilateral CMBs, L>R. The MRI consistent with cerebral amyloid angiopathy. She has high risk of cortical ICH. Her episodes are CCA related transient focal neurological episodes (TFNEs). EEG no seizure activity, add keppra, and avoid antiplatelets or anticoagulation. OK with ASA 81mg  only. BP goal < 140/90, and she has not quit smoking yet. Pt not compliant with keppra at all. Complains of HA and found to have bilateral occipital neuralgia, however, she declined occipital nerve block.   Patient returns today for follow-up visit after hospital admission in 01/2018 for scattered multiple infarcts related to SVD.  Patient not consistently compliant with all medications.  Continues to complain of headache but denies starting new medication for this.  Plan: - continue ASA 81mg  for cardiac prevention  -Continue Lipitor for secondary stroke prevention -Continue Keppra 375 mg twice a day - follow up with Dr. Burt Knack and PCP. -Avoid Advil and take Tylenol only for pain -Advised patient on importance of compliance of all prescribed medications -Consider medication management for headaches in the future as patient declining at this time - follow up in 4 months.  Greater than 50% of time during this 25 minute visit was spent on counseling,explanation of diagnosis of scattered multiple infarcts, reviewing risk factor management of HLD, HTN, and HA, planning of further management, discussion with patient and family and coordination of care  Venancio Poisson, [facility]  [facility] [address] [place], [place] [address]  Phone [phone] Fax [phone]

## 2018-04-30 ENCOUNTER — Ambulatory Visit (INDEPENDENT_AMBULATORY_CARE_PROVIDER_SITE_OTHER): Payer: Medicare Other | Admitting: Adult Health

## 2018-04-30 ENCOUNTER — Encounter: Payer: Self-pay | Admitting: Adult Health

## 2018-04-30 VITALS — BP 110/65 | HR 78 | Wt 100.6 lb

## 2018-04-30 DIAGNOSIS — I6300 Cerebral infarction due to thrombosis of unspecified precerebral artery: Secondary | ICD-10-CM

## 2018-04-30 DIAGNOSIS — I10 Essential (primary) hypertension: Secondary | ICD-10-CM

## 2018-04-30 DIAGNOSIS — E785 Hyperlipidemia, unspecified: Secondary | ICD-10-CM

## 2018-04-30 DIAGNOSIS — I680 Cerebral amyloid angiopathy: Secondary | ICD-10-CM

## 2018-04-30 DIAGNOSIS — I639 Cerebral infarction, unspecified: Secondary | ICD-10-CM | POA: Diagnosis not present

## 2018-04-30 MED ORDER — LEVETIRACETAM 250 MG PO TABS: 400.0000 mg | ORAL_TABLET | Freq: Two times a day (BID) | ORAL | 3 refills | Status: AC

## 2018-04-30 NOTE — Patient Instructions (Addendum)
Continue aspirin 81 mg daily  and lipitor  for secondary stroke prevention  Continue to follow up with PCP regarding cholesterol and blood pressure management   Continue to follow up with cardiologist  Take tylenol for pain - do not take Advil due to increase of bleeding  Continue to monitor blood pressure at home  Maintain strict control of hypertension with blood pressure goal below 130/90, diabetes with hemoglobin A1c goal below 6.5% and cholesterol with LDL cholesterol (bad cholesterol) goal below 70 mg/dL. I also advised the patient to eat a healthy diet with plenty of whole grains, cereals, fruits and vegetables, exercise regularly and maintain ideal body weight.  Followup in the future with me in 4 months or call earlier if needed         Thank you for coming to see [place] at [facility]. I hope we have been able to provide you high quality care today.  You may receive a patient satisfaction survey over the next few weeks. We would appreciate your feedback and comments so that we may continue to improve ourselves and the health of our patients.

## 2018-05-02 ENCOUNTER — Encounter: Payer: Self-pay | Admitting: Cardiovascular Disease

## 2018-05-02 ENCOUNTER — Ambulatory Visit (INDEPENDENT_AMBULATORY_CARE_PROVIDER_SITE_OTHER): Payer: Medicare Other | Admitting: Cardiovascular Disease

## 2018-05-02 VITALS — BP 130/70 | HR 63 | Ht 65.0 in | Wt 97.6 lb

## 2018-05-02 DIAGNOSIS — I25119 Atherosclerotic heart disease of native coronary artery with unspecified angina pectoris: Secondary | ICD-10-CM

## 2018-05-02 DIAGNOSIS — I10 Essential (primary) hypertension: Secondary | ICD-10-CM | POA: Diagnosis not present

## 2018-05-02 DIAGNOSIS — I639 Cerebral infarction, unspecified: Secondary | ICD-10-CM | POA: Diagnosis not present

## 2018-05-02 NOTE — Patient Instructions (Signed)

## 2018-05-02 NOTE — Progress Notes (Signed)
Cardiology Office Note Date:  05/02/2018   ID:  [name], DOB [birth year], MRN [id number]  PCP:  Marletta Lor, MD  Cardiologist:  Sherren Mocha, MD    Chief Complaint  Patient presents with  . Follow-up    CAD     History of Present Illness: [name] is a 77 y.o. female who presents for follow-up of coronary artery disease and history of Takotsubo Cardiomyopathy.   Here with her son today. Not doing well. She reports poor energy, poor appetite, balance problems.  She is lost a lot of weight and is down under 100 pounds now.  She does not have any specific cardiac complaints and denies exertional dyspnea, leg swelling, orthopnea, PND, or heart palpitations.  She does complain of a bandlike chest discomfort that she feels might be related to arthritis.  There is no association with physical exertion or emotional stress.  Past Medical History:  Diagnosis Date  . Anemia   . Arthritis   . Blood transfusion without reported diagnosis   . Bronchitis    hx of  . CAD (coronary artery disease)    a. BMS to LAD 2008, multivessel disease // b. LHC 6/17 (ant-lat STEMI): LM ok, pLAD 70, mLAD 50, dLAD 60, oD2 70, oLCx 60, pRCA 30, dRCA 50, EF 35-45% with apical ballooning - no culprit for MI and no PCI performed - prob TakoTsubo CM  . Carotid artery disease ([place])    Carotid US 6/28: RICA 31-51%, LICA 7-61% >> follow-up 1 year  . Chronic back pain   . Diverticulitis   . Diverticulosis of colon (without mention of hemorrhage)   . Essential hypertension    Dr. Bluford Kaufmann [phone] at [facility]  . Hemorrhage of gastrointestinal tract, unspecified   . History of echocardiogram    a. Echo 05/14/16: Mild focal basal septal hypertrophy, EF 60-65%, normal wall motion, grade 2 diastolic dysfunction, mild TR  . History of non-ST elevation myocardial infarction (NSTEMI) 2008  . History of pneumonia   . Hyperlipidemia   . Hypothyroidism   . 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 Personal history of colonic polyps    Dr. Ardis Hughs, GI  . Raynaud's syndrome   . Takotsubo cardiomyopathy    a. Admitted 6/17 with anterolateral STEMI >> no culprit on LHC and EF 35-45% with apical ballooning // b. Echo prior to discharge with EF 60-65% and normal wall motion //  c. Limited echo 7/17: EF 55-60%, no RWMA, no pericardial effusion  . Ulcer   . Urinary frequency     Past Surgical History:  Procedure Laterality Date  . ABDOMINAL HYSTERECTOMY    . ABDOMINAL HYSTERECTOMY  1975  . APPENDECTOMY  1955  . BREAST EXCISIONAL BIOPSY Right 1969   no visible scar  . CARDIAC CATHETERIZATION N/A 05/13/2016   Procedure: Left Heart Cath and Coronary Angiography;  Surgeon: Wellington Hampshire, MD;  Location: [facility];  Service: Cardiovascular;  Laterality: N/A;  . CHOLECYSTECTOMY    . CORONARY ANGIOPLASTY WITH STENT PLACEMENT     x1, 2007or 2008  . GASTROINTESTINAL STOMAL TUMOR RESECTION    . LAPAROSCOPIC PARTIAL COLECTOMY  12/23/2012   Procedure: LAPAROSCOPIC PARTIAL COLECTOMY;  Surgeon: Stark Klein, MD;  Location: [facility];  Service: General;  Laterality: N/A;  . PARTIAL COLECTOMY  12/23/2012  . TUBAL LIGATION      Current Outpatient Medications  Medication Sig Dispense Refill  . albuterol (  PROVENTIL HFA;VENTOLIN HFA) 108 (90 Base) MCG/ACT inhaler Inhale 2 puffs into the lungs every 6 (six) hours as needed for wheezing or shortness of breath. 1 Inhaler 0  . aspirin EC 81 MG tablet Take 81 mg by mouth daily.    Marland Kitchen atorvastatin (LIPITOR) 40 MG tablet Take 1 tablet (40 mg total) by mouth daily at 6 PM. 90 tablet 3  . levETIRAcetam (KEPPRA) 250 MG tablet Take 1.5 tablets (375 mg total) by mouth 2 (two) times daily. 135 tablet 3  . levothyroxine (SYNTHROID, LEVOTHROID) 75 MCG tablet TAKE 1 TABLET(75 MCG) BY MOUTH DAILY BEFORE BREAKFAST 90 tablet 0  . metoprolol succinate (TOPROL-XL) 25 MG 24 hr tablet TAKE 1 TABLET BY MOUTH DAILY 90 tablet 0  .  nitroGLYCERIN (NITROSTAT) 0.4 MG SL tablet Place 1 tablet (0.4 mg total) under the tongue every 5 (five) minutes as needed for chest pain. 25 tablet 3   No current facility-administered medications for this visit.     Allergies:   Patient has no known allergies.   Social History:  The patient  reports that she has been smoking cigarettes.  She has been smoking about 1.00 pack per day for the past 0.00 years. She has never used smokeless tobacco. She reports that she does not drink alcohol or use drugs.   Family History:  The patient's family history includes Breast cancer in her mother; Cerebral aneurysm in her sister; Heart disease in her mother; Lung cancer in her father.    ROS:  Please see the history of present illness.  Otherwise, review of systems is positive for poor appetite, hearing loss, abdominal pain, diarrhea, back pain, dizziness, easy bruising, balance problems, headaches.  All other systems are reviewed and negative.    PHYSICAL EXAM: VS:  BP 130/70   Pulse 63   Ht 5\' 5"  (1.651 m)   Wt 97 lb 9.6 oz (44.3 kg)   SpO2 99%   BMI 16.24 kg/m  , BMI Body mass index is 16.24 kg/m. GEN: thin, frail-appearing woman, in no acute distress  HEENT: normal  Neck: no JVD, no masses. No carotid bruits Cardiac: RRR without murmur or gallop                Respiratory:  clear to auscultation bilaterally, normal work of breathing GI: soft, nontender, nondistended, + BS MS: no deformity or atrophy  Ext: no pretibial edema Skin: warm and dry, no rash Neuro:  Strength and sensation are intact Psych: euthymic mood, full affect  EKG:  EKG is not ordered today.  Recent Labs: 01/07/2018: ALT 13; BUN 13; Creatinine, Ser 0.99; Hemoglobin 13.7; Platelets 177; Potassium 3.9; Sodium 141; TSH 3.557   Lipid Panel     Component Value Date/Time   CHOL 158 01/07/2018 2251   TRIG 96 01/07/2018 2251   HDL 56 01/07/2018 2251   CHOLHDL 2.8 01/07/2018 2251   VLDL 19 01/07/2018 2251   LDLCALC 83  01/07/2018 2251   LDLDIRECT 181.6 08/26/2013 1041      Wt Readings from Last 3 Encounters:  05/02/18 97 lb 9.6 oz (44.3 kg)  04/30/18 100 lb 9.6 oz (45.6 kg)  01/20/18 100 lb 12.8 oz (45.7 kg)     Cardiac Studies Reviewed: Echo 01/08/2018: Left ventricle:  The cavity size was normal. Wall thickness was normal. Systolic function was vigorous. The estimated ejection fraction was in the range of 65% to 70%. Wall motion was normal; there were no regional wall motion abnormalities.  ------------------------------------------------------------------- Aortic valve:  Structurally normal valve.   Cusp separation was normal.  Doppler:  Transvalvular velocity was within the normal range. There was no stenosis. There was no regurgitation.  ------------------------------------------------------------------- Aorta:  Aortic root: The aortic root was normal in size. Ascending aorta: The ascending aorta was normal in size.  ------------------------------------------------------------------- Mitral valve:   Structurally normal valve.   Leaflet separation was normal.  Doppler:  Transvalvular velocity was within the normal range. There was no evidence for stenosis. There was mild regurgitation.    Peak gradient (D): 2 mm Hg.  ------------------------------------------------------------------- Left atrium:  The atrium was normal in size.  ------------------------------------------------------------------- Right ventricle:  The cavity size was normal. Systolic function was normal.  ------------------------------------------------------------------- Pulmonic valve:    The valve appears to be grossly normal. Doppler:  There was no significant regurgitation.  ------------------------------------------------------------------- Tricuspid valve:   Structurally normal valve.   Leaflet separation was normal.  Doppler:  Transvalvular velocity was within the normal range. There was mild-moderate  regurgitation.  ------------------------------------------------------------------- Pulmonary artery:   Systolic pressure was within the normal range.   ------------------------------------------------------------------- Right atrium:  The atrium was normal in size.  ------------------------------------------------------------------- Pericardium:  There was no pericardial effusion.  Carotid US 01/08/2018: Final Interpretation: Right Carotid: Velocities in the right ICA are consistent with a 1-39% stenosis.        Non-hemodynamically significant plaque <50% noted in the CCA.  Left Carotid: Velocities in the left ICA are consistent with a 1-39% stenosis.       Non-hemodynamically significant plaque noted in the CCA.  Vertebrals: Both vertebral arteries were patent with antegrade flow.  *See table(s) above for measurements and observations.  ASSESSMENT AND PLAN: 1.  Coronary artery disease, native vessel, with atypical angina: Patient will continue her current medical program.  Her symptoms appear to be noncardiac.  She is treated with aspirin and a statin drug.  Also tolerating a beta-blocker.  2.  Hyperlipidemia: Treated with atorvastatin 40 mg.  Most recent lipids reviewed.  3.  Hypertension: Controlled with metoprolol succinate.  4. Weight loss/general decline: FU with PCP. I reviewed CT studies, labs today. No clear etiology to tie all of her symptoms together.   Current medicines are reviewed with the patient today.  The patient does not have concerns regarding medicines.  Labs/ tests ordered today include:  No orders of the defined types were placed in this encounter.   Disposition:   FU one year  Signed, Sherren Mocha, MD  05/02/2018 4:20 PM    [facility] [address], [place], [place]  [address] Phone: [phone]; Fax: [phone]

## 2018-05-04 NOTE — Progress Notes (Signed)
I reviewed above note and agree with the assessment and plan.   Rosalin Hawking, MD PhD Stroke Neurology 05/04/2018 6:10 PM

## 2018-05-06 ENCOUNTER — Ambulatory Visit (INDEPENDENT_AMBULATORY_CARE_PROVIDER_SITE_OTHER): Payer: Medicare Other

## 2018-05-06 ENCOUNTER — Ambulatory Visit
Admission: RE | Admit: 2018-05-06 | Discharge: 2018-05-06 | Disposition: A | Discharge status: Home / Self Care | Payer: Medicare Other | Source: Ambulatory Visit | Attending: Internal Medicine | Admitting: Internal Medicine

## 2018-05-06 VITALS — BP 94/60 | HR 71 | Ht 60.0 in | Wt 100.0 lb

## 2018-05-06 DIAGNOSIS — Z0000 Encounter for general adult medical examination without abnormal findings: Secondary | ICD-10-CM

## 2018-05-06 DIAGNOSIS — Z1231 Encounter for screening mammogram for malignant neoplasm of breast: Secondary | ICD-10-CM

## 2018-05-06 NOTE — Patient Instructions (Addendum)
[name] , Thank you for taking time to come for your Medicare Wellness Visit. I appreciate your ongoing commitment to your health goals. Please review the following plan we discussed and let me know if I can assist you in the future.   A Tetanus is recommended every 10 years. Medicare covers a tetanus if you have a cut or wound; otherwise, there may be a charge. If you had not had a tetanus with pertusses, known as the Tdap, you can take this anytime.   Son is working on hearing aid for [name]  Will schedule an eye exam sometimes this year.   Get your PT exercises out and start doing them every day   Can make an apt with Dr. Raliegh Ip ( or other doctor) at the same time, you can schedule your AWV.)     These are the goals we discussed: Goals    . Gain weight     Will try to eat whatever you want;  Try to eat foods that are calorie dense. Add honey to beans or bread (biscuits)  Mac and cheese  Cream potatoes with butter        This is a list of the screening recommended for you and due dates:  Health Maintenance  Topic Date Due  . Tetanus Vaccine  07/02/1960  . DEXA scan (bone density measurement)  07/03/2007  . Pneumonia vaccines (1 of 2 - PCV13) 07/03/2007  . Flu Shot  07/03/2018  . Colon Cancer Screening  12/20/2020      Fall Prevention in the Home Falls can cause injuries. They can happen to people of all ages. There are many things you can do to make your home safe and to help prevent falls. What can I do on the outside of my home?  Regularly fix the edges of walkways and driveways and fix any cracks.  Remove anything that might make you trip as you walk through a door, such as a raised step or threshold.  Trim any bushes or trees on the path to your home.  Use bright outdoor lighting.  Clear any walking paths of anything that might make someone trip, such as rocks or tools.  Regularly check to see if handrails are loose or broken. Make sure that both sides  of any steps have handrails.  Any raised decks and porches should have guardrails on the edges.  Have any leaves, snow, or ice cleared regularly.  Use sand or salt on walking paths during winter.  Clean up any spills in your garage right away. This includes oil or grease spills. What can I do in the bathroom?  Use night lights.  Install grab bars by the toilet and in the tub and shower. Do not use towel bars as grab bars.  Use non-skid mats or decals in the tub or shower.  If you need to sit down in the shower, use a plastic, non-slip stool.  Keep the floor dry. Clean up any water that spills on the floor as soon as it happens.  Remove soap buildup in the tub or shower regularly.  Attach bath mats securely with double-sided non-slip rug tape.  Do not have throw rugs and other things on the floor that can make you trip. What can I do in the bedroom?  Use night lights.  Make sure that you have a light by your bed that is easy to reach.  Do not use any sheets or blankets that are too big for  your bed. They should not hang down onto the floor.  Have a firm chair that has side arms. You can use this for support while you get dressed.  Do not have throw rugs and other things on the floor that can make you trip. What can I do in the kitchen?  Clean up any spills right away.  Avoid walking on wet floors.  Keep items that you use a lot in easy-to-reach places.  If you need to reach something above you, use a strong step stool that has a grab bar.  Keep electrical cords out of the way.  Do not use floor polish or wax that makes floors slippery. If you must use wax, use non-skid floor wax.  Do not have throw rugs and other things on the floor that can make you trip. What can I do with my stairs?  Do not leave any items on the stairs.  Make sure that there are handrails on both sides of the stairs and use them. Fix handrails that are broken or loose. Make sure that  handrails are as long as the stairways.  Check any carpeting to make sure that it is firmly attached to the stairs. Fix any carpet that is loose or worn.  Avoid having throw rugs at the top or bottom of the stairs. If you do have throw rugs, attach them to the floor with carpet tape.  Make sure that you have a light switch at the top of the stairs and the bottom of the stairs. If you do not have them, ask someone to add them for you. What else can I do to help prevent falls?  Wear shoes that: ? Do not have high heels. ? Have rubber bottoms. ? Are comfortable and fit you well. ? Are closed at the toe. Do not wear sandals.  If you use a stepladder: ? Make sure that it is fully opened. Do not climb a closed stepladder. ? Make sure that both sides of the stepladder are locked into place. ? Ask someone to hold it for you, if possible.  Clearly mark and make sure that you can see: ? Any grab bars or handrails. ? First and last steps. ? Where the edge of each step is.  Use tools that help you move around (mobility aids) if they are needed. These include: ? Canes. ? Walkers. ? Scooters. ? Crutches.  Turn on the lights when you go into a dark area. Replace any light bulbs as soon as they burn out.  Set up your furniture so you have a clear path. Avoid moving your furniture around.  If any of your floors are uneven, fix them.  If there are any pets around you, be aware of where they are.  Review your medicines with your doctor. Some medicines can make you feel dizzy. This can increase your chance of falling. Ask your doctor what other things that you can do to help prevent falls. This information is not intended to replace advice given to you by your health care provider. Make sure you discuss any questions you have with your health care provider. Document Released: 09/15/2009 Document Revised: 04/26/2016 Document Reviewed: 12/24/2014 Elsevier Interactive Patient Education  2018  [place] Maintenance, Female Adopting a healthy lifestyle and getting preventive care can go a long way to promote health and wellness. Talk with your health care provider about what schedule of regular examinations is right for you. This is a good chance for  you to check in with your provider about disease prevention and staying healthy. In between checkups, there are plenty of things you can do on your own. Experts have done a lot of research about which lifestyle changes and preventive measures are most likely to keep you healthy. Ask your health care provider for more information. Weight and diet Eat a healthy diet  Be sure to include plenty of vegetables, fruits, low-fat dairy products, and lean protein.  Do not eat a lot of foods high in solid fats, added sugars, or salt.  Get regular exercise. This is one of the most important things you can do for your health. ? Most adults should exercise for at least 150 minutes each week. The exercise should increase your heart rate and make you sweat (moderate-intensity exercise). ? Most adults should also do strengthening exercises at least twice a week. This is in addition to the moderate-intensity exercise.  Maintain a healthy weight  Body mass index (BMI) is a measurement that can be used to identify possible weight problems. It estimates body fat based on height and weight. Your health care provider can help determine your BMI and help you achieve or maintain a healthy weight.  For females 44 years of age and older: ? A BMI below 18.5 is considered underweight. ? A BMI of 18.5 to 24.9 is normal. ? A BMI of 25 to 29.9 is considered overweight. ? A BMI of 30 and above is considered obese.  Watch levels of cholesterol and blood lipids  You should start having your blood tested for lipids and cholesterol at 76 years of age, then have this test every 5 years.  You may need to have your cholesterol levels checked more often  if: ? Your lipid or cholesterol levels are high. ? You are older than 76 years of age. ? You are at high risk for heart disease.  Cancer screening Lung Cancer  Lung cancer screening is recommended for adults 22-62 years old who are at high risk for lung cancer because of a history of smoking.  A yearly low-dose CT scan of the lungs is recommended for people who: ? Currently smoke. ? Have quit within the past 15 years. ? Have at least a 30-pack-year history of smoking. A pack year is smoking an average of one pack of cigarettes a day for 1 year.  Yearly screening should continue until it has been 15 years since you quit.  Yearly screening should stop if you develop a health problem that would prevent you from having lung cancer treatment.  Breast Cancer  Practice breast self-awareness. This means understanding how your breasts normally appear and feel.  It also means doing regular breast self-exams. Let your health care provider know about any changes, no matter how small.  If you are in your 20s or 30s, you should have a clinical breast exam (CBE) by a health care provider every 1-3 years as part of a regular health exam.  If you are 55 or older, have a CBE every year. Also consider having a breast X-ray (mammogram) every year.  If you have a family history of breast cancer, talk to your health care provider about genetic screening.  If you are at high risk for breast cancer, talk to your health care provider about having an MRI and a mammogram every year.  Breast cancer gene (BRCA) assessment is recommended for women who have family members with BRCA-related cancers. BRCA-related cancers include: ? Breast. ? Ovarian. ?  Tubal. ? Peritoneal cancers.  Results of the assessment will determine the need for genetic counseling and BRCA1 and BRCA2 testing.  Cervical Cancer Your health care provider may recommend that you be screened regularly for cancer of the pelvic organs  (ovaries, uterus, and vagina). This screening involves a pelvic examination, including checking for microscopic changes to the surface of your cervix (Pap test). You may be encouraged to have this screening done every 3 years, beginning at age 48.  For women ages 14-65, health care providers may recommend pelvic exams and Pap testing every 3 years, or they may recommend the Pap and pelvic exam, combined with testing for human papilloma virus (HPV), every 5 years. Some types of HPV increase your risk of cervical cancer. Testing for HPV may also be done on women of any age with unclear Pap test results.  Other health care providers may not recommend any screening for nonpregnant women who are considered low risk for pelvic cancer and who do not have symptoms. Ask your health care provider if a screening pelvic exam is right for you.  If you have had past treatment for cervical cancer or a condition that could lead to cancer, you need Pap tests and screening for cancer for at least 20 years after your treatment. If Pap tests have been discontinued, your risk factors (such as having a new sexual partner) need to be reassessed to determine if screening should resume. Some women have medical problems that increase the chance of getting cervical cancer. In these cases, your health care provider may recommend more frequent screening and Pap tests.  Colorectal Cancer  This type of cancer can be detected and often prevented.  Routine colorectal cancer screening usually begins at 76 years of age and continues through 76 years of age.  Your health care provider may recommend screening at an earlier age if you have risk factors for colon cancer.  Your health care provider may also recommend using home test kits to check for hidden blood in the stool.  A small camera at the end of a tube can be used to examine your colon directly (sigmoidoscopy or colonoscopy). This is done to check for the earliest forms of  colorectal cancer.  Routine screening usually begins at age 80.  Direct examination of the colon should be repeated every 5-10 years through 76 years of age. However, you may need to be screened more often if early forms of precancerous polyps or small growths are found.  Skin Cancer  Check your skin from head to toe regularly.  Tell your health care provider about any new moles or changes in moles, especially if there is a change in a mole's shape or color.  Also tell your health care provider if you have a mole that is larger than the size of a pencil eraser.  Always use sunscreen. Apply sunscreen liberally and repeatedly throughout the day.  Protect yourself by wearing long sleeves, pants, a wide-brimmed hat, and sunglasses whenever you are outside.  Heart disease, diabetes, and high blood pressure  High blood pressure causes heart disease and increases the risk of stroke. High blood pressure is more likely to develop in: ? People who have blood pressure in the high end of the normal range (130-139/85-89 mm Hg). ? People who are overweight or obese. ? People who are African American.  If you are 60-54 years of age, have your blood pressure checked every 3-5 years. If you are 12 years of age or  older, have your blood pressure checked every year. You should have your blood pressure measured twice-once when you are at a hospital or clinic, and once when you are not at a hospital or clinic. Record the average of the two measurements. To check your blood pressure when you are not at a hospital or clinic, you can use: ? An automated blood pressure machine at a pharmacy. ? A home blood pressure monitor.  If you are between 65 years and 64 years old, ask your health care provider if you should take aspirin to prevent strokes.  Have regular diabetes screenings. This involves taking a blood sample to check your fasting blood sugar level. ? If you are at a normal weight and have a low risk  for diabetes, have this test once every three years after 76 years of age. ? If you are overweight and have a high risk for diabetes, consider being tested at a younger age or more often. Preventing infection Hepatitis B  If you have a higher risk for hepatitis B, you should be screened for this virus. You are considered at high risk for hepatitis B if: ? You were born in a country where hepatitis B is common. Ask your health care provider which countries are considered high risk. ? Your parents were born in a high-risk country, and you have not been immunized against hepatitis B (hepatitis B vaccine). ? You have HIV or AIDS. ? You use needles to inject street drugs. ? You live with someone who has hepatitis B. ? You have had sex with someone who has hepatitis B. ? You get hemodialysis treatment. ? You take certain medicines for conditions, including cancer, organ transplantation, and autoimmune conditions.  Hepatitis C  Blood testing is recommended for: ? Everyone born from [birth year] through 1964. ? Anyone with known risk factors for hepatitis C.  Sexually transmitted infections (STIs)  You should be screened for sexually transmitted infections (STIs) including gonorrhea and chlamydia if: ? You are sexually active and are younger than 76 years of age. ? You are older than 76 years of age and your health care provider tells you that you are at risk for this type of infection. ? Your sexual activity has changed since you were last screened and you are at an increased risk for chlamydia or gonorrhea. Ask your health care provider if you are at risk.  If you do not have HIV, but are at risk, it may be recommended that you take a prescription medicine daily to prevent HIV infection. This is called pre-exposure prophylaxis (PrEP). You are considered at risk if: ? You are sexually active and do not regularly use condoms or know the HIV status of your partner(s). ? You take drugs by  injection. ? You are sexually active with a partner who has HIV.  Talk with your health care provider about whether you are at high risk of being infected with HIV. If you choose to begin PrEP, you should first be tested for HIV. You should then be tested every 3 months for as long as you are taking PrEP. Pregnancy  If you are premenopausal and you may become pregnant, ask your health care provider about preconception counseling.  If you may become pregnant, take 400 to 800 micrograms (mcg) of folic acid every day.  If you want to prevent pregnancy, talk to your health care provider about birth control (contraception). Osteoporosis and menopause  Osteoporosis is a disease in which the bones lose minerals  and strength with aging. This can result in serious bone fractures. Your risk for osteoporosis can be identified using a bone density scan.  If you are 22 years of age or older, or if you are at risk for osteoporosis and fractures, ask your health care provider if you should be screened.  Ask your health care provider whether you should take a calcium or vitamin D supplement to lower your risk for osteoporosis.  Menopause may have certain physical symptoms and risks.  Hormone replacement therapy may reduce some of these symptoms and risks. Talk to your health care provider about whether hormone replacement therapy is right for you. Follow these instructions at home:  Schedule regular health, dental, and eye exams.  Stay current with your immunizations.  Do not use any tobacco products including cigarettes, chewing tobacco, or electronic cigarettes.  If you are pregnant, do not drink alcohol.  If you are breastfeeding, limit how much and how often you drink alcohol.  Limit alcohol intake to no more than 1 drink per day for nonpregnant women. One drink equals 12 ounces of beer, 5 ounces of wine, or 1 ounces of hard liquor.  Do not use street drugs.  Do not share needles.  Ask  your health care provider for help if you need support or information about quitting drugs.  Tell your health care provider if you often feel depressed.  Tell your health care provider if you have ever been abused or do not feel safe at home. This information is not intended to replace advice given to you by your health care provider. Make sure you discuss any questions you have with your health care provider. Document Released: 06/04/2011 Document Revised: 04/26/2016 Document Reviewed: 08/23/2015 Elsevier Interactive Patient Education  2018 [employer].   Steps to Quit Smoking Smoking tobacco can be bad for your health. It can also affect almost every organ in your body. Smoking puts you and people around you at risk for many serious long-lasting (chronic) diseases. Quitting smoking is hard, but it is one of the best things that you can do for your health. It is never too late to quit. What are the benefits of quitting smoking? When you quit smoking, you lower your risk for getting serious diseases and conditions. They can include:  Lung cancer or lung disease.  Heart disease.  Stroke.  Heart attack.  Not being able to have children (infertility).  Weak bones (osteoporosis) and broken bones (fractures).  If you have coughing, wheezing, and shortness of breath, those symptoms may get better when you quit. You may also get sick less often. If you are pregnant, quitting smoking can help to lower your chances of having a baby of low birth weight. What can I do to help me quit smoking? Talk with your doctor about what can help you quit smoking. Some things you can do (strategies) include:  Quitting smoking totally, instead of slowly cutting back how much you smoke over a period of time.  Going to in-person counseling. You are more likely to quit if you go to many counseling sessions.  Using resources and support systems, such as: ? Database administrator with a Social worker. ? Phone  quitlines. ? Careers information officer. ? Support groups or group counseling. ? Text messaging programs. ? Mobile phone apps or applications.  Taking medicines. Some of these medicines may have nicotine in them. If you are pregnant or breastfeeding, do not take any medicines to quit smoking unless your doctor says it  is okay. Talk with your doctor about counseling or other things that can help you.  Talk with your doctor about using more than one strategy at the same time, such as taking medicines while you are also going to in-person counseling. This can help make quitting easier. What things can I do to make it easier to quit? Quitting smoking might feel very hard at first, but there is a lot that you can do to make it easier. Take these steps:  Talk to your family and friends. Ask them to support and encourage you.  Call phone quitlines, reach out to support groups, or work with a Social worker.  Ask people who smoke to not smoke around you.  Avoid places that make you want (trigger) to smoke, such as: ? Bars. ? Parties. ? Smoke-break areas at work.  Spend time with people who do not smoke.  Lower the stress in your life. Stress can make you want to smoke. Try these things to help your stress: ? Getting regular exercise. ? Deep-breathing exercises. ? Yoga. ? Meditating. ? Doing a body scan. To do this, close your eyes, focus on one area of your body at a time from head to toe, and notice which parts of your body are tense. Try to relax the muscles in those areas.  Download or buy apps on your mobile phone or tablet that can help you stick to your quit plan. There are many free apps, such as QuitGuide from the [employer] Office manager for Disease Control and Prevention). You can find more support from smokefree.gov and other websites.  This information is not intended to replace advice given to you by your health care provider. Make sure you discuss any questions you have with your health care  provider. Document Released: 09/15/2009 Document Revised: 07/17/2016 Document Reviewed: 04/05/2015 Elsevier Interactive Patient Education  2018 [employer].

## 2018-05-06 NOTE — Progress Notes (Addendum)
Subjective:   [name] is a 77 y.o. female who presents for Medicare Annual (Subsequent) preventive examination.  Reports health as "downhill"  Son states her arm went numb and traveled up to her face No incident with her arm since but did have numbness in her hand x 1 week ago and took 3 ASA (baby) and symptoms went away  She has apt since this episode and seen 5/29  Not has steady on her feet and no appetite    Just seen by Dr. Burt Knack 04/2018  Seen Dr. Raliegh Ip 01/2018 01/07/2018 Stroke; speech and R arm weakness - followed by neurology  Son is caregiver   Spouse is DM and has neuropathy in feet   Diet  Has diarrhea since her colectomy and has abd pain  Colectomy 12/2012  BMI 19.5  In the am; nothing  Had peanut butter sandwich today Given supplements  12 noon; no set schedule to eat  Cooks between 6 and 8 pm Likes beans,  [employer], 1/2 runners;  Vegetables;  Once in awhile, will eat hamburger from [employer]  Once in awhile, she will eat well   Exercise Housekeeping with son  There are no preventive care reminders to display for this patient.   Need of IMM but given a very firm NO at this time IMM postponed   Current every day smoker / in the LDCT program   States she has not a bone fx  No falls recently - fell on the porch after stroke Lost her footing but did not get hurt;  > than 15 minutes getting out of chair and walking to the door and back; ( 6 ft)   PT gave her exercises; states she is to tired to do them but she is not eating      Objective:     Vitals: BP 94/60   Pulse 71   Ht 5' (1.524 m)   Wt 100 lb (45.4 kg)   SpO2 95%   BMI 19.53 kg/m   Body mass index is 19.53 kg/m.  Advanced Directives 05/06/2018 01/09/2018 01/07/2018 05/15/2017 05/15/2017 05/03/2017 05/13/2016  Does Patient Have a Medical Advance Directive? No - No No No;Yes Yes No  Type of Advance Directive - - - - Living will;Healthcare Power of Attorney - -  Would patient like information on  creating a medical advance directive? - No - Patient declined - - - - No - patient declined information  Pre-existing out of facility DNR order (yellow form or pink MOST form) - - - - - - -   Given 2 forms for her and spouse to complete  advanced directive.   Tobacco Social History   Tobacco Use  Smoking Status Current Every Day Smoker  . Packs/day: 1.00  . Years: 0.00  . Pack years: 0.00  . Types: Cigarettes  Smokeless Tobacco Never Used  Tobacco Comment   in LDCT program     Ready to quit: Not Answered Counseling given: Not Answered Comment: in LDCT program   Clinical Intake:  Past Medical History:  Diagnosis Date  . Anemia   . Arthritis   . Blood transfusion without reported diagnosis   . Bronchitis    hx of  . CAD (coronary artery disease)    a. BMS to LAD 2008, multivessel disease // b. LHC 6/17 (ant-lat STEMI): LM ok, pLAD 70, mLAD 50, dLAD 60, oD2 70, oLCx 60, pRCA 30, dRCA 50, EF 35-45% with apical ballooning - no culprit for  MI and no PCI performed - prob TakoTsubo CM  . Carotid artery disease ([place])    Carotid US 9/48: RICA 54-62%, LICA 7-03% >> follow-up 1 year  . Chronic back pain   . Diverticulitis   . Diverticulosis of colon (without mention of hemorrhage)   . Essential hypertension    Dr. Bluford Kaufmann [phone] at [place] [facility]  . Hemorrhage of gastrointestinal tract, unspecified   . History of echocardiogram    a. Echo 05/14/16: Mild focal basal septal hypertrophy, EF 60-65%, normal wall motion, grade 2 diastolic dysfunction, mild TR  . History of non-ST elevation myocardial infarction (NSTEMI) 2008  . History of pneumonia   . Hyperlipidemia   . Hypothyroidism   . 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 Personal history of colonic polyps    Dr. Ardis Hughs, GI  . Raynaud's syndrome   . Takotsubo cardiomyopathy    a. Admitted 6/17 with anterolateral STEMI >> no culprit on LHC and EF 35-45% with apical  ballooning // b. Echo prior to discharge with EF 60-65% and normal wall motion //  c. Limited echo 7/17: EF 55-60%, no RWMA, no pericardial effusion  . Ulcer   . Urinary frequency    Past Surgical History:  Procedure Laterality Date  . ABDOMINAL HYSTERECTOMY    . ABDOMINAL HYSTERECTOMY  1975  . APPENDECTOMY  1955  . BREAST EXCISIONAL BIOPSY Right 1969   no visible scar  . CARDIAC CATHETERIZATION N/A 05/13/2016   Procedure: Left Heart Cath and Coronary Angiography;  Surgeon: Wellington Hampshire, MD;  Location: [facility];  Service: Cardiovascular;  Laterality: N/A;  . CHOLECYSTECTOMY    . CORONARY ANGIOPLASTY WITH STENT PLACEMENT     x1, 2007or 2008  . GASTROINTESTINAL STOMAL TUMOR RESECTION    . LAPAROSCOPIC PARTIAL COLECTOMY  12/23/2012   Procedure: LAPAROSCOPIC PARTIAL COLECTOMY;  Surgeon: Stark Klein, MD;  Location: [facility];  Service: General;  Laterality: N/A;  . PARTIAL COLECTOMY  12/23/2012  . TUBAL LIGATION     Family History  Problem Relation Age of Onset  . Heart disease Mother        Verdie Drown  . Breast cancer Mother   . Lung cancer Father        SISTER, UNCLE  . Cerebral aneurysm Sister        UNCLE NEPHEW  . Colon cancer Neg Hx   . Esophageal cancer Neg Hx   . Stomach cancer Neg Hx   . Rectal cancer Neg Hx    Social History   Socioeconomic History  . Marital status: Married    Spouse name: Not on file  . Number of children: Not on file  . Years of education: Not on file  . Highest education level: Not on file  Occupational History  . Occupation: [employer]  Social Needs  . Financial resource strain: Not on file  . Food insecurity:    Worry: Not on file    Inability: Not on file  . Transportation needs:    Medical: Not on file    Non-medical: Not on file  Tobacco Use  . Smoking status: Current Every Day Smoker    Packs/day: 1.00    Years: 0.00    Pack years: 0.00    Types: Cigarettes  . Smokeless tobacco: Never Used  . Tobacco comment:  in LDCT program  Substance and Sexual Activity  . Alcohol use: No    Alcohol/week: 0.0 oz  . Drug use:  No  . Sexual activity: Not on file  Lifestyle  . Physical activity:    Days per week: Not on file    Minutes per session: Not on file  . Stress: Not on file  Relationships  . Social connections:    Talks on phone: Not on file    Gets together: Not on file    Attends religious service: Not on file    Active member of club or organization: Not on file    Attends meetings of clubs or organizations: Not on file    Relationship status: Not on file  Other Topics Concern  . Not on file  Social History Narrative  . Not on file    Outpatient Encounter Medications as of 05/06/2018  Medication Sig  . albuterol (PROVENTIL HFA;VENTOLIN HFA) 108 (90 Base) MCG/ACT inhaler Inhale 2 puffs into the lungs every 6 (six) hours as needed for wheezing or shortness of breath.  Marland Kitchen aspirin EC 81 MG tablet Take 81 mg by mouth daily.  Marland Kitchen atorvastatin (LIPITOR) 40 MG tablet Take 1 tablet (40 mg total) by mouth daily at 6 PM.  . levETIRAcetam (KEPPRA) 250 MG tablet Take 1.5 tablets (375 mg total) by mouth 2 (two) times daily.  Marland Kitchen levothyroxine (SYNTHROID, LEVOTHROID) 75 MCG tablet TAKE 1 TABLET(75 MCG) BY MOUTH DAILY BEFORE BREAKFAST  . metoprolol succinate (TOPROL-XL) 25 MG 24 hr tablet TAKE 1 TABLET BY MOUTH DAILY  . nitroGLYCERIN (NITROSTAT) 0.4 MG SL tablet Place 1 tablet (0.4 mg total) under the tongue every 5 (five) minutes as needed for chest pain.   No facility-administered encounter medications on file as of 05/06/2018.     Activities of Daily Living In your present state of health, do you have any difficulty performing the following activities: 05/06/2018 01/09/2018  Hearing? N N  Vision? N N  Difficulty concentrating or making decisions? N N  Walking or climbing stairs? Y N  Comment uses rails -  Dressing or bathing? N N  Doing errands, shopping? N N  Preparing Food and eating ? N -  Some recent  data might be hidden    Patient Care Team: Marletta Lor, MD as PCP - General Weston Settle, MD as Referring Physician (Dermatology) Sherren Mocha, MD as Consulting Physician (Cardiology)    Assessment:   This is a routine wellness examination for [name].  Exercise Activities and Dietary recommendations    Goals    . Gain weight     Will try to eat whatever you want;  Try to eat foods that are calorie dense. Add honey to beans or bread (biscuits)  Mac and cheese  Cream potatoes with butter        Fall Risk Fall Risk  05/06/2018 04/30/2018 12/24/2017 09/17/2017 06/21/2017  Falls in the past year? Yes No No No Yes  Comment - - - - Emmi Telephone Survey: data to providers prior to load  Number falls in past yr: 1 - - - 1  Comment - - - - Emmi Telephone Survey Actual Response = 1  Injury with Fall? Yes - - - No  Risk for fall due to : - - - - -  Follow up Education provided - - - -     Depression Screen PHQ 2/9 Scores 05/06/2018 05/03/2017 03/29/2016 04/14/2015  PHQ - 2 Score 0 0 0 0     Cognitive Function MMSE - Mini Mental State Exam 05/03/2017  Orientation to time 5  Orientation to Place 5  Registration 3  Attention/ Calculation 3  Recall 3  Language- name 2 objects 2  Language- repeat 1  Language- follow 3 step command 3  Language- read & follow direction 1  Write a sentence 1  Copy design 1  Total score 28   Recall 3/3; estimated time correctly; serial 3 from 20 without difficulty. Oriented and engaged    Ad8 score reviewed for issues:  Issues making decisions:  Less interest in hobbies / activities:  Repeats questions, stories (family complaining):  Trouble using ordinary gadgets (microwave, computer, phone):  Forgets the month or year:   Mismanaging finances:   Remembering appts:  Daily problems with thinking and/or memory: Ad8 score is=0         There is no immunization history on file for this patient.    Screening  Tests Health Maintenance  Topic Date Due  . PNA vac Low Risk Adult (1 of 2 - PCV13) 05/07/2019 (Originally 07/03/2007)  . DEXA SCAN  06/02/2019 (Originally 07/03/2007)  . TETANUS/TDAP  06/02/2019 (Originally [birth year]/1961)  . INFLUENZA VACCINE  07/03/2018  . COLONOSCOPY  12/20/2020         Plan:      PCP Notes   Health Maintenance Postponed any IMM; declines them  Son states she will be getting a hearing aid soon Plans to schedule her eye exam soon  Discussed exercise and weight gain. Wt 100; BMI 19.5; Agrees to try to gain and eat small amounts throughout the day Given samples of enlive shake  Protein deficit inpatient;  (states her stomach hurts post colectomy)   Encouraging exercise Starting to shuffle feet; agreed to walk more and start doing the exercises PT showed her every day   Abnormal Screens  None   Referrals  none  Patient concerns; Fatigue; encouraged better nutrition and more calories   Nurse Concerns; Needs to walk and exercise some. Agrees to try to walk more  Next PCP apt Scheduled July 29th    I have personally reviewed and noted the following in the patient's chart:   . Medical and social history . Use of alcohol, tobacco or illicit drugs  . Current medications and supplements . Functional ability and status . Nutritional status . Physical activity . Advanced directives . List of other physicians . Hospitalizations, surgeries, and ER visits in previous 12 months . Vitals . Screenings to include cognitive, depression, and falls . Referrals and appointments  In addition, I have reviewed and discussed with patient certain preventive protocols, quality metrics, and best practice recommendations. A written personalized care plan for preventive services as well as general preventive health recommendations were provided to patient.     RRNHA,FBXUX, RN  05/06/2018  Subsequent Medicare wellness visit reviewed and agree with findings.  Marletta Lor

## 2018-05-08 DIAGNOSIS — H61393 Other acquired stenosis of external ear canal, bilateral: Secondary | ICD-10-CM | POA: Diagnosis not present

## 2018-05-08 DIAGNOSIS — H6123 Impacted cerumen, bilateral: Secondary | ICD-10-CM | POA: Diagnosis not present

## 2018-05-08 DIAGNOSIS — H903 Sensorineural hearing loss, bilateral: Secondary | ICD-10-CM | POA: Diagnosis not present

## 2018-05-12 ENCOUNTER — Inpatient Hospital Stay: Admission: RE | Admit: 2018-05-12 | Payer: Medicare Other | Source: Ambulatory Visit

## 2018-05-27 ENCOUNTER — Ambulatory Visit (INDEPENDENT_AMBULATORY_CARE_PROVIDER_SITE_OTHER)
Admission: RE | Admit: 2018-05-27 | Discharge: 2018-05-27 | Disposition: A | Discharge status: Home / Self Care | Payer: Medicare Other | Source: Ambulatory Visit | Attending: Acute Care | Admitting: Acute Care

## 2018-05-27 DIAGNOSIS — F17210 Nicotine dependence, cigarettes, uncomplicated: Secondary | ICD-10-CM

## 2018-06-02 ENCOUNTER — Other Ambulatory Visit: Payer: Self-pay | Admitting: Acute Care

## 2018-06-02 DIAGNOSIS — Z122 Encounter for screening for malignant neoplasm of respiratory organs: Secondary | ICD-10-CM

## 2018-06-02 DIAGNOSIS — F17210 Nicotine dependence, cigarettes, uncomplicated: Principal | ICD-10-CM

## 2018-06-18 ENCOUNTER — Other Ambulatory Visit: Payer: Self-pay | Admitting: Internal Medicine

## 2018-06-30 ENCOUNTER — Ambulatory Visit (INDEPENDENT_AMBULATORY_CARE_PROVIDER_SITE_OTHER): Payer: Medicare Other | Admitting: Internal Medicine

## 2018-06-30 ENCOUNTER — Encounter: Payer: Self-pay | Admitting: Internal Medicine

## 2018-06-30 VITALS — BP 120/68 | HR 57 | Temp 97.9°F | Ht 64.75 in | Wt 97.4 lb

## 2018-06-30 DIAGNOSIS — E034 Atrophy of thyroid (acquired): Secondary | ICD-10-CM | POA: Diagnosis not present

## 2018-06-30 DIAGNOSIS — E559 Vitamin D deficiency, unspecified: Secondary | ICD-10-CM

## 2018-06-30 DIAGNOSIS — I10 Essential (primary) hypertension: Secondary | ICD-10-CM | POA: Diagnosis not present

## 2018-06-30 DIAGNOSIS — I680 Cerebral amyloid angiopathy: Secondary | ICD-10-CM

## 2018-06-30 DIAGNOSIS — J438 Other emphysema: Secondary | ICD-10-CM | POA: Diagnosis not present

## 2018-06-30 DIAGNOSIS — I639 Cerebral infarction, unspecified: Secondary | ICD-10-CM | POA: Diagnosis not present

## 2018-06-30 LAB — CBC WITH DIFFERENTIAL/PLATELET
BASOS ABS: 0 10*3/uL (ref 0.0–0.1)
Basophils Relative: 0.3 % (ref 0.0–3.0)
EOS ABS: 0.1 10*3/uL (ref 0.0–0.7)
Eosinophils Relative: 1.3 % (ref 0.0–5.0)
HCT: 44.1 % (ref 36.0–46.0)
HEMOGLOBIN: 14.7 g/dL (ref 12.0–15.0)
Lymphocytes Relative: 43.9 % (ref 12.0–46.0)
Lymphs Abs: 2.3 10*3/uL (ref 0.7–4.0)
MCHC: 33.4 g/dL (ref 30.0–36.0)
MCV: 96.7 fl (ref 78.0–100.0)
MONO ABS: 0.5 10*3/uL (ref 0.1–1.0)
Monocytes Relative: 8.9 % (ref 3.0–12.0)
Neutro Abs: 2.4 10*3/uL (ref 1.4–7.7)
Neutrophils Relative %: 45.6 % (ref 43.0–77.0)
PLATELETS: 181 10*3/uL (ref 150.0–400.0)
RBC: 4.56 Mil/uL (ref 3.87–5.11)
RDW: 13.9 % (ref 11.5–15.5)
WBC: 5.3 10*3/uL (ref 4.0–10.5)

## 2018-06-30 LAB — COMPREHENSIVE METABOLIC PANEL
ALBUMIN: 4.2 g/dL (ref 3.5–5.2)
ALK PHOS: 66 U/L (ref 39–117)
ALT: 8 U/L (ref 0–35)
AST: 14 U/L (ref 0–37)
BILIRUBIN TOTAL: 0.5 mg/dL (ref 0.2–1.2)
BUN: 13 mg/dL (ref 6–23)
CALCIUM: 9.8 mg/dL (ref 8.4–10.5)
CHLORIDE: 102 meq/L (ref 96–112)
CO2: 34 mEq/L — ABNORMAL HIGH (ref 19–32)
CREATININE: 0.94 mg/dL (ref 0.40–1.20)
GFR: 61.54 mL/min (ref >60.00)
Glucose, Bld: 82 mg/dL (ref 70–99)
Potassium: 4.4 mEq/L (ref 3.5–5.1)
SODIUM: 142 meq/L (ref 135–145)
TOTAL PROTEIN: 6.3 g/dL (ref 6.0–8.3)

## 2018-06-30 LAB — TSH: TSH: 1.3 u[IU]/mL (ref 0.35–4.50)

## 2018-06-30 LAB — VITAMIN D 25 HYDROXY (VIT D DEFICIENCY, FRACTURES): VITD: 14.87 ng/mL — ABNORMAL LOW (ref 30.00–100.00)

## 2018-06-30 NOTE — Progress Notes (Signed)
Subjective:    Patient ID: [name], female    DOB: [birth year], 77 y.o.   MRN: [id number]  HPI  Wt Readings from Last 3 Encounters:  06/30/18 97 lb 6.4 oz (44.2 kg)  05/06/18 100 lb (45.4 kg)  05/02/18 97 lb 9.6 oz (65.74 kg)   77 year old patient who is seen today in follow-up.  She was hospitalized in February of this year for an acute CVA.  She presented with expressive aphasia and right sided symptoms.  She was diagnosed with cerebral amyloid angiopathy with multiple small strokes.  She has coronary artery disease hypertension and hypothyroidism.  She remains on statin therapy. Today she complains of some weight loss.  Review of her chart reveals that her weight 1 year ago was 100 pounds present weight 97.4 She complains of poor appetite. She has had a recent low-dose chest CT scanning for lung cancer.  She continues to smoke 1/2 pack of cigarettes daily.  She is also had a recent mammogram. She also complains of some DOE.  She has had some situational stress with the poor health of her husband who is presently readmitted to the hospital for heart failure and a left-sided pleural effusion.  Her son lives with his parents  Past Medical History:  Diagnosis Date  . Anemia   . Arthritis   . Blood transfusion without reported diagnosis   . Bronchitis    hx of  . CAD (coronary artery disease)    a. BMS to LAD 2008, multivessel disease // b. LHC 6/17 (ant-lat STEMI): LM ok, pLAD 70, mLAD 50, dLAD 60, oD2 70, oLCx 60, pRCA 30, dRCA 50, EF 35-45% with apical ballooning - no culprit for MI and no PCI performed - prob TakoTsubo CM  . Carotid artery disease ([place])    Carotid US 4/33: RICA 29-51%, LICA 8-84% >> follow-up 1 year  . Chronic back pain   . Diverticulitis   . Diverticulosis of colon (without mention of hemorrhage)   . Essential hypertension    Dr. Bluford Kaufmann [phone] at [place]  . Hemorrhage of gastrointestinal tract, unspecified   . History of echocardiogram     a. Echo 05/14/16: Mild focal basal septal hypertrophy, EF 60-65%, normal wall motion, grade 2 diastolic dysfunction, mild TR  . History of non-ST elevation myocardial infarction (NSTEMI) 2008  . History of pneumonia   . Hyperlipidemia   . Hypothyroidism   . 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 Personal history of colonic polyps    Dr. Ardis Hughs, GI  . Raynaud's syndrome   . Takotsubo cardiomyopathy    a. Admitted 6/17 with anterolateral STEMI >> no culprit on LHC and EF 35-45% with apical ballooning // b. Echo prior to discharge with EF 60-65% and normal wall motion //  c. Limited echo 7/17: EF 55-60%, no RWMA, no pericardial effusion  . Ulcer   . Urinary frequency      Social History   Socioeconomic History  . Marital status: Married    Spouse name: Not on file  . Number of children: Not on file  . Years of education: Not on file  . Highest education level: Not on file  Occupational History  . Occupation: [employer]  Social Needs  . Financial resource strain: Not on file  . Food insecurity:    Worry: Not on file    Inability: Not on file  . Transportation needs:    Medical: Not on file  Non-medical: Not on file  Tobacco Use  . Smoking status: Current Every Day Smoker    Packs/day: 1.00    Years: 0.00    Pack years: 0.00    Types: Cigarettes  . Smokeless tobacco: Never Used  . Tobacco comment: in LDCT program  Substance and Sexual Activity  . Alcohol use: No    Alcohol/week: 0.0 oz  . Drug use: No  . Sexual activity: Not on file  Lifestyle  . Physical activity:    Days per week: Not on file    Minutes per session: Not on file  . Stress: Not on file  Relationships  . Social connections:    Talks on phone: Not on file    Gets together: Not on file    Attends religious service: Not on file    Active member of club or organization: Not on file    Attends meetings of clubs or organizations: Not on file    Relationship status:  Not on file  . Intimate partner violence:    Fear of current or ex partner: Not on file    Emotionally abused: Not on file    Physically abused: Not on file    Forced sexual activity: Not on file  Other Topics Concern  . Not on file  Social History Narrative  . Not on file    Past Surgical History:  Procedure Laterality Date  . ABDOMINAL HYSTERECTOMY    . ABDOMINAL HYSTERECTOMY  1975  . APPENDECTOMY  1955  . BREAST EXCISIONAL BIOPSY Right 1969   no visible scar  . CARDIAC CATHETERIZATION N/A 05/13/2016   Procedure: Left Heart Cath and Coronary Angiography;  Surgeon: Wellington Hampshire, MD;  Location: [facility];  Service: Cardiovascular;  Laterality: N/A;  . CHOLECYSTECTOMY    . CORONARY ANGIOPLASTY WITH STENT PLACEMENT     x1, 2007or 2008  . GASTROINTESTINAL STOMAL TUMOR RESECTION    . LAPAROSCOPIC PARTIAL COLECTOMY  12/23/2012   Procedure: LAPAROSCOPIC PARTIAL COLECTOMY;  Surgeon: Stark Klein, MD;  Location: [facility];  Service: General;  Laterality: N/A;  . PARTIAL COLECTOMY  12/23/2012  . TUBAL LIGATION      Family History  Problem Relation Age of Onset  . Heart disease Mother        Verdie Drown  . Breast cancer Mother   . Lung cancer Father        SISTER, UNCLE  . Cerebral aneurysm Sister        UNCLE NEPHEW  . Colon cancer Neg Hx   . Esophageal cancer Neg Hx   . Stomach cancer Neg Hx   . Rectal cancer Neg Hx     No Known Allergies  Current Outpatient Medications on File Prior to Visit  Medication Sig Dispense Refill  . albuterol (PROVENTIL HFA;VENTOLIN HFA) 108 (90 Base) MCG/ACT inhaler Inhale 2 puffs into the lungs every 6 (six) hours as needed for wheezing or shortness of breath. 1 Inhaler 0  . aspirin EC 81 MG tablet Take 81 mg by mouth daily.    Marland Kitchen atorvastatin (LIPITOR) 40 MG tablet Take 1 tablet (40 mg total) by mouth daily at 6 PM. 90 tablet 3  . levETIRAcetam (KEPPRA) 250 MG tablet Take 1.5 tablets (375 mg total) by mouth 2 (two) times daily. 135  tablet 3  . metoprolol succinate (TOPROL-XL) 25 MG 24 hr tablet TAKE 1 TABLET BY MOUTH DAILY 90 tablet 0  . nitroGLYCERIN (NITROSTAT) 0.4 MG SL tablet Place 1 tablet (0.4  mg total) under the tongue every 5 (five) minutes as needed for chest pain. 25 tablet 3  . SYNTHROID 75 MCG tablet TAKE 1 TABLET(75 MCG) BY MOUTH DAILY BEFORE BREAKFAST 90 tablet 0   No current facility-administered medications on file prior to visit.     BP 120/68 (BP Location: Right Arm, Patient Position: Sitting, Cuff Size: Normal)   Pulse (!) 57   Temp 97.9 F (36.6 C) (Oral)   Ht 5' 4.75" (1.645 m)   Wt 97 lb 6.4 oz (44.2 kg)   SpO2 95%   BMI 16.33 kg/m    Review of Systems  Constitutional: Positive for activity change and appetite change.  HENT: Negative for congestion, dental problem, hearing loss, rhinorrhea, sinus pressure, sore throat and tinnitus.   Eyes: Negative for pain, discharge and visual disturbance.  Respiratory: Positive for shortness of breath. Negative for cough.   Cardiovascular: Negative for chest pain, palpitations and leg swelling.  Gastrointestinal: Negative for abdominal distention, abdominal pain, blood in stool, constipation, diarrhea, nausea and vomiting.  Genitourinary: Negative for difficulty urinating, dysuria, flank pain, frequency, hematuria, pelvic pain, urgency, vaginal bleeding, vaginal discharge and vaginal pain.  Musculoskeletal: Negative for arthralgias, gait problem and joint swelling.  Skin: Negative for rash.  Neurological: Negative for dizziness, syncope, speech difficulty, weakness, numbness and headaches.  Hematological: Negative for adenopathy.  Psychiatric/Behavioral: Negative for agitation, behavioral problems and dysphoric mood. The patient is not nervous/anxious.        Objective:   Physical Exam  Constitutional: She is oriented to person, place, and time. She appears well-developed and well-nourished.  Underweight No distress Blood pressure normal  HENT:    Head: Normocephalic.  Right Ear: External ear normal.  Left Ear: External ear normal.  Mouth/Throat: Oropharynx is clear and moist.  Eyes: Pupils are equal, round, and reactive to light. Conjunctivae and EOM are normal.  Neck: Normal range of motion. Neck supple. No thyromegaly present.  No adenopathy  Cardiovascular: Normal rate, regular rhythm, normal heart sounds and intact distal pulses.  Pulmonary/Chest: Effort normal.  Decreased breath sounds  Abdominal: Soft. Bowel sounds are normal. She exhibits no mass. There is no tenderness.  No masses.  Mild generalized tenderness.  Surgical scar present  Musculoskeletal: Normal range of motion.  Lymphadenopathy:    She has no cervical adenopathy.  Neurological: She is alert and oriented to person, place, and time.  Skin: Skin is warm and dry. No rash noted.  Psychiatric: She has a normal mood and affect. Her behavior is normal.          Assessment & Plan:   Hypothyroidism.  Will review a TSH Coronary artery disease stable COPD  Cerebral amyloid angiopathy. History of weight loss.  No significant weight loss over the past year  Will check updated lab Follow-up 4 months with flu vaccine  Vaccinations and immunizations discussed and encouraged  Marletta Lor

## 2018-06-30 NOTE — Patient Instructions (Addendum)
Take a calcium supplement, plus [phone] units of vitamin D    It is important that you exercise regularly, at least 20 minutes 3 to 4 times per week.  If you develop chest pain or shortness of breath seek  medical attention.  Smoking tobacco is very bad for your health. You should stop smoking immediately.

## 2018-07-01 ENCOUNTER — Other Ambulatory Visit: Payer: Self-pay | Admitting: Internal Medicine

## 2018-07-01 MED ORDER — VITAMIN D (ERGOCALCIFEROL) 1.25 MG (50000 UNIT) PO CAPS: 50000.0000 [IU] | ORAL_CAPSULE | ORAL | 0 refills | Status: AC

## 2018-07-01 MED ORDER — VITAMIN D 50 MCG (2000 UT) PO TABS: 2000.0000 [IU] | ORAL_TABLET | Freq: Every day | ORAL | 0 refills | Status: AC

## 2018-07-28 ENCOUNTER — Telehealth: Payer: Self-pay | Admitting: Family Medicine

## 2018-07-28 ENCOUNTER — Emergency Department (HOSPITAL_COMMUNITY)
Admission: EM | Admit: 2018-07-28 | Discharge: 2018-08-03 | Disposition: E | Payer: Medicare Other | Attending: Emergency Medicine | Admitting: Emergency Medicine

## 2018-07-28 ENCOUNTER — Encounter (HOSPITAL_COMMUNITY): Payer: Self-pay | Admitting: Emergency Medicine

## 2018-07-28 DIAGNOSIS — J449 Chronic obstructive pulmonary disease, unspecified: Secondary | ICD-10-CM | POA: Diagnosis not present

## 2018-07-28 DIAGNOSIS — R000 Tachycardia, unspecified: Secondary | ICD-10-CM | POA: Diagnosis not present

## 2018-07-28 DIAGNOSIS — R9431 Abnormal electrocardiogram [ECG] [EKG]: Secondary | ICD-10-CM | POA: Diagnosis not present

## 2018-07-28 DIAGNOSIS — I2510 Atherosclerotic heart disease of native coronary artery without angina pectoris: Secondary | ICD-10-CM | POA: Diagnosis not present

## 2018-07-28 DIAGNOSIS — Z7982 Long term (current) use of aspirin: Secondary | ICD-10-CM | POA: Diagnosis not present

## 2018-07-28 DIAGNOSIS — Z79899 Other long term (current) drug therapy: Secondary | ICD-10-CM | POA: Insufficient documentation

## 2018-07-28 DIAGNOSIS — R55 Syncope and collapse: Secondary | ICD-10-CM | POA: Diagnosis not present

## 2018-07-28 DIAGNOSIS — R404 Transient alteration of awareness: Secondary | ICD-10-CM | POA: Diagnosis not present

## 2018-07-28 DIAGNOSIS — R0689 Other abnormalities of breathing: Secondary | ICD-10-CM | POA: Diagnosis not present

## 2018-07-28 DIAGNOSIS — F17210 Nicotine dependence, cigarettes, uncomplicated: Secondary | ICD-10-CM | POA: Insufficient documentation

## 2018-07-28 DIAGNOSIS — I469 Cardiac arrest, cause unspecified: Secondary | ICD-10-CM | POA: Diagnosis not present

## 2018-07-28 DIAGNOSIS — E039 Hypothyroidism, unspecified: Secondary | ICD-10-CM | POA: Insufficient documentation

## 2018-07-28 DIAGNOSIS — I252 Old myocardial infarction: Secondary | ICD-10-CM | POA: Diagnosis not present

## 2018-07-28 DIAGNOSIS — I10 Essential (primary) hypertension: Secondary | ICD-10-CM | POA: Insufficient documentation

## 2018-07-28 LAB — I-STAT TROPONIN, ED: Troponin i, poc: 0.07 ng/mL (ref 0.00–0.08)

## 2018-07-28 MED ORDER — EPINEPHRINE PF 1 MG/10ML IJ SOSY
PREFILLED_SYRINGE | INTRAMUSCULAR | Status: AC | PRN
  Administered 2018-07-28 (×3): 1 mg via INTRAVENOUS

## 2018-07-28 MED ORDER — CALCIUM CHLORIDE 10 % IV SOLN
INTRAVENOUS | Status: AC | PRN
  Administered 2018-07-28: 1 g via INTRAVENOUS

## 2018-07-28 MED ORDER — SODIUM BICARBONATE 8.4 % IV SOLN
INTRAVENOUS | Status: AC | PRN
  Administered 2018-07-28 (×2): 50 meq via INTRAVENOUS

## 2018-07-28 MED ORDER — EPINEPHRINE PF 1 MG/10ML IJ SOSY
PREFILLED_SYRINGE | INTRAMUSCULAR | Status: AC | PRN
  Administered 2018-07-28 (×2): 1 mg via INTRAVENOUS

## 2018-07-28 MED FILL — Medication: Qty: 1 | Status: AC

## 2018-08-03 NOTE — ED Triage Notes (Addendum)
Patient arrived with EMS from home found on the floor by family after a fall , patient lost pulses in the ambulance - received 4 doses of Epinephrine / intubated prior to arrival , pulseless at arrival - CPR initiated .

## 2018-08-03 NOTE — ED Notes (Addendum)
Post mortem care rendered, son is contacting his siblings /family. ETT/peripheral IV removed .

## 2018-08-03 NOTE — ED Notes (Signed)
Patient placement notified on pt.'s death.

## 2018-08-03 NOTE — Code Documentation (Signed)
Pulse check - pulseless , CPR continues.

## 2018-08-03 NOTE — Code Documentation (Signed)
Pulse check - pulseless, CPR continues .

## 2018-08-03 NOTE — ED Notes (Signed)
CPR terminated - Dr. Wyvonnia Dusky declared time of death 72.

## 2018-08-03 NOTE — ED Notes (Signed)
Called STEMI per Dr Wyvonnia Dusky

## 2018-08-03 NOTE — Code Documentation (Signed)
Pulse check - pulseless , CPR continues [name]) .

## 2018-08-03 NOTE — Telephone Encounter (Signed)
On call physician received phone call from [facility] ED, Dr. Wyvonnia Dusky for death notification. Cause was cardiac arrest. Pronounced at 3:24 am. Will forward to PCP. Briscoe Deutscher, DO

## 2018-08-03 NOTE — Progress Notes (Signed)
Chaplain was called to be with the son of the PT.  Son Nicole Kindred) was visibly shaken in receving the news of PT passing.  Son requested a prayer once all nursing staff left the room.  Chaplain gave patient placement card to the son.  Used ministry of presence to help him get through and determine what next possible steps would be.  Family was awaiting son from [place] to come and see PT.

## 2018-08-03 NOTE — ED Notes (Addendum)
[name] ( son ) advised nurse that his brother is coming to see the patient around 8am this morning .

## 2018-08-03 NOTE — ED Notes (Signed)
Pt son took wedding band home.

## 2018-08-03 NOTE — ED Notes (Signed)
[facility] notified by RN , referral # [id number] [name].

## 2018-08-03 NOTE — Code Documentation (Signed)
Pulse check- no pulse , CPR continues using Linton Rump .

## 2018-08-03 NOTE — ED Notes (Signed)
Chaplin paged

## 2018-08-03 NOTE — ED Notes (Signed)
Cardiology Paged.

## 2018-08-03 NOTE — ED Provider Notes (Signed)
[facility] EMERGENCY DEPARTMENT Provider Note   CSN: [id number] Arrival date & time: 20-Aug-2018  0258     History   Chief Complaint No chief complaint on file.   HPI [name] is a 77 y.o. female.  Level 5 caveat for acuity of condition.  Patient brought in by EMS after collapsing at home.  EMS reports that she was unresponsive but did have a pulse on initial evaluation.  EKG showed ST elevation in V2 and V3.  Patient had a bradycardic PEA arrest in the ambulance and CPR was begun.  She was intubated by EMS.  She is received 4 mg of epinephrine and 30 minutes of CPR prior to arrival.  On arrival she is paced but has no pulse on initial check.  CPR was continued on arrival.  Son states patient had no complaints yesterday and denies any recent chest pain or shortness of breath.  The history is provided by the patient and the EMS personnel. The history is limited by the condition of the patient.    Past Medical History:  Diagnosis Date  . Anemia   . Arthritis   . Blood transfusion without reported diagnosis   . Bronchitis    hx of  . CAD (coronary artery disease)    a. BMS to LAD 2008, multivessel disease // b. LHC 6/17 (ant-lat STEMI): LM ok, pLAD 70, mLAD 50, dLAD 60, oD2 70, oLCx 60, pRCA 30, dRCA 50, EF 35-45% with apical ballooning - no culprit for MI and no PCI performed - prob [place] CM  . Carotid artery disease ([place])    Carotid US 5/18: RICA 84-16%, LICA 6-06% >> follow-up 1 year  . Chronic back pain   . Diverticulitis   . Diverticulosis of colon (without mention of hemorrhage)   . Essential hypertension    Dr. Bluford Kaufmann [phone] at [facility]  . Hemorrhage of gastrointestinal tract, unspecified   . History of echocardiogram    a. Echo 05/14/16: Mild focal basal septal hypertrophy, EF 60-65%, normal wall motion, grade 2 diastolic dysfunction, mild TR  . History of non-ST elevation myocardial infarction (NSTEMI) 2008  . History of  pneumonia   . Hyperlipidemia   . Hypothyroidism   . 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 Personal history of colonic polyps    Dr. Ardis Hughs, GI  . Raynaud's syndrome   . Takotsubo cardiomyopathy    a. Admitted 6/17 with anterolateral STEMI >> no culprit on LHC and EF 35-45% with apical ballooning // b. Echo prior to discharge with EF 60-65% and normal wall motion //  c. Limited echo 7/17: EF 55-60%, no RWMA, no pericardial effusion  . Ulcer   . Urinary frequency     Patient Active Problem List   Diagnosis Date Noted  . CVA (cerebral vascular accident) ([place]) 01/08/2018  . Stroke ([place]) 01/08/2018  . Right arm numbness 01/08/2018  . Chronic abdominal pain 10/01/2017  . Coronary artery disease involving native coronary artery of native heart with angina pectoris ([place]) 09/18/2017  . History of Bell's palsy 09/18/2017  . Cerebral amyloid angiopathy ([place]) 09/17/2017  . COPD (chronic obstructive pulmonary disease) with emphysema ([place]) 07/09/2017  . Stress-induced cardiomyopathy 05/13/2016  . Acute coronary syndrome ([place]) 05/13/2016  . ST elevation myocardial infarction (STEMI) ([place])   . Vitamin B12 deficiency 08/09/2015  . Vitamin D deficiency 08/09/2015  . Osteoarthritis 02/25/2014  . Adenomatous colon polyp 12/05/2012  . Smoker 08/22/2012  .  History of colonic polyps 07/28/2010  . Carotid artery disease ([place]) 07/03/2010  . CEREBRAL ANEURYSM 06/29/2010  . PALPITATIONS 03/29/2009  . ABDOMINAL PAIN-RLQ 03/29/2009  . Backache 10/04/2008  . Dyslipidemia 03/25/2008  . ANXIETY 03/25/2008  . Coronary atherosclerosis 03/25/2008  . Raynaud's syndrome 03/25/2008  . OSTEOPENIA 03/25/2008  . GASTROINTESTINAL HEMORRHAGE, HX OF 03/25/2008  . Hypothyroidism 02/05/2008  . Essential hypertension 02/05/2008  . UTI 11/10/2007  . URINARY FREQUENCY 11/10/2007  . 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 UNSPECIFIED HEMORRHAGE OF GASTROINTESTINAL  TRACT 03/11/2007  . Diverticulosis of colon (without mention of hemorrhage) 02/15/1996    Past Surgical History:  Procedure Laterality Date  . ABDOMINAL HYSTERECTOMY    . ABDOMINAL HYSTERECTOMY  1975  . APPENDECTOMY  1955  . BREAST EXCISIONAL BIOPSY Right 1969   no visible scar  . CARDIAC CATHETERIZATION N/A 05/13/2016   Procedure: Left Heart Cath and Coronary Angiography;  Surgeon: Wellington Hampshire, MD;  Location: [facility];  Service: Cardiovascular;  Laterality: N/A;  . CHOLECYSTECTOMY    . CORONARY ANGIOPLASTY WITH STENT PLACEMENT     x1, 2007or 2008  . GASTROINTESTINAL STOMAL TUMOR RESECTION    . LAPAROSCOPIC PARTIAL COLECTOMY  12/23/2012   Procedure: LAPAROSCOPIC PARTIAL COLECTOMY;  Surgeon: Stark Klein, MD;  Location: [facility];  Service: General;  Laterality: N/A;  . PARTIAL COLECTOMY  12/23/2012  . TUBAL LIGATION       OB History   None      Home Medications    Prior to Admission medications   Medication Sig Start Date End Date Taking? Authorizing Provider  albuterol (PROVENTIL HFA;VENTOLIN HFA) 108 (90 Base) MCG/ACT inhaler Inhale 2 puffs into the lungs every 6 (six) hours as needed for wheezing or shortness of breath. 07/09/17   Marletta Lor, MD  aspirin EC 81 MG tablet Take 81 mg by mouth daily.    [provider]  atorvastatin (LIPITOR) 40 MG tablet Take 1 tablet (40 mg total) by mouth daily at 6 PM. 01/20/18   Marletta Lor, MD  Cholecalciferol (VITAMIN D) 2000 units tablet Take 1 tablet (2,000 Units total) by mouth daily. 07/01/18   Marletta Lor, MD  levETIRAcetam (KEPPRA) 250 MG tablet Take 1.5 tablets (375 mg total) by mouth 2 (two) times daily. 04/30/18   Venancio Poisson, NP  metoprolol succinate (TOPROL-XL) 25 MG 24 hr tablet TAKE 1 TABLET BY MOUTH DAILY 04/22/18   Marletta Lor, MD  nitroGLYCERIN (NITROSTAT) 0.4 MG SL tablet Place 1 tablet (0.4 mg total) under the tongue every 5 (five) minutes as needed for chest pain.  05/17/16 04/30/18  Marletta Lor, MD  SYNTHROID 75 MCG tablet TAKE 1 TABLET(75 MCG) BY MOUTH DAILY BEFORE BREAKFAST 06/18/18   Marletta Lor, MD  Vitamin D, Ergocalciferol, (DRISDOL) 50000 units CAPS capsule Take 1 capsule (50,000 Units total) by mouth every 7 (seven) days. 07/01/18   Marletta Lor, MD    Family History Family History  Problem Relation Age of Onset  . Heart disease Mother        Verdie Drown  . Breast cancer Mother   . Lung cancer Father        SISTER, UNCLE  . Cerebral aneurysm Sister        UNCLE NEPHEW  . Colon cancer Neg Hx   . Esophageal cancer Neg Hx   . Stomach cancer Neg Hx   . Rectal cancer Neg Hx     Social History  Social History   Tobacco Use  . Smoking status: Current Every Day Smoker    Packs/day: 1.00    Years: 0.00    Pack years: 0.00    Types: Cigarettes  . Smokeless tobacco: Never Used  . Tobacco comment: in LDCT program  Substance Use Topics  . Alcohol use: No    Alcohol/week: 0.0 standard drinks  . Drug use: No     Allergies   Patient has no known allergies.   Review of Systems Review of Systems  Unable to perform ROS: Patient unresponsive     Physical Exam Updated Vital Signs There were no vitals taken for this visit.  Physical Exam  Constitutional:  Unresponsive, pale  HENT:  Head: Normocephalic and atraumatic.  Eyes:  Fixed and dilated  Cardiovascular:  Pulses intact with compressions  Pulmonary/Chest:  Equal breath sounds with bagging  Abdominal: There is no tenderness.  Genitourinary:  Genitourinary Comments: incontinent  Musculoskeletal: Normal range of motion. She exhibits no edema.  Neurological:  unresponsive     ED Treatments / Results  Labs (all labs ordered are listed, but only abnormal results are displayed) Labs Reviewed  I-STAT TROPONIN, ED    EKG None  Radiology No results found.  Procedures Procedures (including critical care time)  Medications Ordered in  ED Medications  EPINEPHrine (ADRENALIN) 1 MG/10ML injection (1 mg Intravenous Given 07-30-18 0312)  sodium bicarbonate injection (50 mEq Intravenous Given 30-Jul-2018 0310)  calcium chloride injection (1 g Intravenous Given 2018-07-30 0309)  EPINEPHrine (ADRENALIN) 1 MG/10ML injection (1 mg Intravenous Given Jul 30, 2018 0318)     Initial Impression / Assessment and Plan / ED Course  I have reviewed the triage vital signs and the nursing notes.  Pertinent labs & imaging results that were available during my care of the patient were reviewed by me and considered in my medical decision making (see chart for details).    Patient arrives with status post CPR.  She is paced on arrival.  On pulse check here, there are no pulses and CPR immediately continued.  Epinephrine given, ET tube placed by paramedics confirmed by direct visualization.  CPR continued throughout the ED course.  No return of spontaneous circulation.  Patient's prehospital EKG did show ST elevation in V3 and V2.  This was discussed with STEMI Dr. Einar Gip.  Bedside ultrasound shows pericardial effusion that does not appear to be circumferential but extends below diaphragm. Does not appear to be tamponade.  Seen at bedside with cardiology fellow Dr. Paticia Stack.  Pericardiocentesis attempted with some return of bright red blood. <5  Cc. Does not appear to be cardiac tamponade but question patient could have had aortic dissection into pericardial space or ventricular wall rupture.  Patient son at bedside.  Discussed the patient has had ongoing CPR for greater than 45 minutes with no return of spontaneous circulation.  Agonal contractions on bedside ultrasound.  Asystole on the monitor. Patient's son agrees with stopping code at this point. Code called at 3:24 AM.  D/w Medical examiner Lanny Hurst who agrees that she is not an ME case.  D/w Dr. Juleen China on call for Dr. Burnice Logan who accepts death certificate to the office.    CRITICAL CARE Performed  by: Ezequiel Essex Total critical care time: 32 minutes Critical care time was exclusive of separately billable procedures and treating other patients. Critical care was necessary to treat or prevent imminent or life-threatening deterioration. Critical care was time spent personally by me on the following activities: development of treatment  plan with patient and/or surrogate as well as nursing, discussions with consultants, evaluation of patient's response to treatment, examination of patient, obtaining history from patient or surrogate, ordering and performing treatments and interventions, ordering and review of laboratory studies, ordering and review of radiographic studies, pulse oximetry and re-evaluation of patient's condition.    EMERGENCY DEPARTMENT [place] CARDIAC EXAM "Study: Limited Ultrasound of the Heart and Pericardium"  INDICATIONS:Cardiac arrest Multiple views of the heart and pericardium were obtained in real-time with a multi-frequency probe.  PERFORMED UM:PNTIRW IMAGES ARCHIVED?: No LIMITATIONS:  Emergent procedure VIEWS USED: Subcostal 4 chamber INTERPRETATION: Cardiac activity absent, Pericardial effusion present, Cardiac tamponade absent and Decreased contractility    Cardiopulmonary Resuscitation (CPR) Procedure Note Directed/Performed by: Ezequiel Essex I personally directed ancillary staff and/or performed CPR in an effort to regain return of spontaneous circulation and to maintain cardiac, neuro and systemic perfusion.    Final Clinical Impressions(s) / ED Diagnoses   Final diagnoses:  Cardiac arrest [facility])    ED Discharge Orders    None       Bernise Sylvain, Annie Main, MD 2018-08-12 [phone]

## 2018-08-03 NOTE — Code Documentation (Signed)
Pulse check - pulseless , Dr. Wyvonnia Dusky performed pericardiocentesis at bedside .

## 2018-08-03 DEATH — deceased

## 2018-08-06 ENCOUNTER — Ambulatory Visit: Payer: Medicare Other | Admitting: Gastroenterology

## 2018-09-03 ENCOUNTER — Ambulatory Visit: Payer: Medicare Other | Admitting: Adult Health

## 2019-05-12 ENCOUNTER — Ambulatory Visit: Payer: Medicare Other
